# Patient Record
Sex: Female | Born: 1955 | Race: White | Hispanic: No | Marital: Single | Smoking: Never smoker
Health system: Southern US, Community
[De-identification: ages and names within clinical notes are randomized; demographics above are authoritative.]

## PROBLEM LIST (undated history)

## (undated) DIAGNOSIS — R32 Unspecified urinary incontinence: Secondary | ICD-10-CM

## (undated) DIAGNOSIS — I10 Essential (primary) hypertension: Secondary | ICD-10-CM

## (undated) HISTORY — DX: Essential (primary) hypertension: I10

## (undated) HISTORY — DX: Unspecified urinary incontinence: R32

## (undated) HISTORY — PX: STRABISMUS SURGERY: SHX218

---

## 2017-10-29 ENCOUNTER — Encounter: Payer: Self-pay | Admitting: Emergency Medicine

## 2017-10-29 ENCOUNTER — Emergency Department
Admission: EM | Admit: 2017-10-29 | Discharge: 2017-10-30 | Disposition: A | Discharge status: Home / Self Care | Payer: No Typology Code available for payment source | Attending: Emergency Medicine | Admitting: Emergency Medicine

## 2017-10-29 ENCOUNTER — Emergency Department: Payer: No Typology Code available for payment source

## 2017-10-29 ENCOUNTER — Other Ambulatory Visit: Payer: Self-pay

## 2017-10-29 DIAGNOSIS — R42 Dizziness and giddiness: Secondary | ICD-10-CM | POA: Diagnosis not present

## 2017-10-29 DIAGNOSIS — I10 Essential (primary) hypertension: Secondary | ICD-10-CM | POA: Diagnosis not present

## 2017-10-29 DIAGNOSIS — H5462 Unqualified visual loss, left eye, normal vision right eye: Secondary | ICD-10-CM | POA: Diagnosis present

## 2017-10-29 LAB — SEDIMENTATION RATE: SED RATE: 19 mm/h (ref 0–30)

## 2017-10-29 LAB — CBC WITH DIFFERENTIAL/PLATELET
Basophils Absolute: 0.1 10*3/uL (ref 0–0.1)
Basophils Relative: 1 %
EOS PCT: 3 %
Eosinophils Absolute: 0.2 10*3/uL (ref 0–0.7)
HCT: 38.2 % (ref 35.0–47.0)
Hemoglobin: 13.5 g/dL (ref 12.0–16.0)
LYMPHS ABS: 1.8 10*3/uL (ref 1.0–3.6)
LYMPHS PCT: 29 %
MCH: 31.5 pg (ref 26.0–34.0)
MCHC: 35.2 g/dL (ref 32.0–36.0)
MCV: 89.3 fL (ref 80.0–100.0)
MONO ABS: 0.4 10*3/uL (ref 0.2–0.9)
MONOS PCT: 7 %
Neutro Abs: 3.8 10*3/uL (ref 1.4–6.5)
Neutrophils Relative %: 60 %
PLATELETS: 299 10*3/uL (ref 150–440)
RBC: 4.28 MIL/uL (ref 3.80–5.20)
RDW: 13.6 % (ref 11.5–14.5)
WBC: 6.2 10*3/uL (ref 3.6–11.0)

## 2017-10-29 LAB — COMPREHENSIVE METABOLIC PANEL
ALT: 26 U/L (ref 0–44)
AST: 22 U/L (ref 15–41)
Albumin: 4.3 g/dL (ref 3.5–5.0)
Alkaline Phosphatase: 65 U/L (ref 38–126)
Anion gap: 8 (ref 5–15)
BUN: 20 mg/dL (ref 8–23)
CHLORIDE: 109 mmol/L (ref 98–111)
CO2: 24 mmol/L (ref 22–32)
Calcium: 9.3 mg/dL (ref 8.9–10.3)
Creatinine, Ser: 0.52 mg/dL (ref 0.44–1.00)
GFR calc Af Amer: >60 mL/min (ref >60)
Glucose, Bld: 94 mg/dL (ref 70–99)
POTASSIUM: 4 mmol/L (ref 3.5–5.1)
Sodium: 141 mmol/L (ref 135–145)
TOTAL PROTEIN: 7.5 g/dL (ref 6.5–8.1)
Total Bilirubin: 0.6 mg/dL (ref 0.3–1.2)

## 2017-10-29 LAB — TROPONIN I

## 2017-10-29 LAB — C-REACTIVE PROTEIN: CRP: <0.8 mg/dL (ref <1.0)

## 2017-10-29 MED ORDER — CLONIDINE HCL 0.1 MG PO TABS: 0.1000 mg | ORAL_TABLET | Freq: Two times a day (BID) | ORAL | 1 refills | Status: DC

## 2017-10-29 MED ORDER — AMLODIPINE BESYLATE 5 MG PO TABS: 5.0000 mg | ORAL_TABLET | Freq: Every day | ORAL | 1 refills | Status: DC

## 2017-10-29 MED ORDER — CLONIDINE HCL 0.1 MG PO TABS
0.1000 mg | ORAL_TABLET | Freq: Once | ORAL | Status: AC
  Administered 2017-10-29: 0.1 mg via ORAL
  Filled 2017-10-29: qty 1

## 2017-10-29 MED ORDER — AMLODIPINE BESYLATE 5 MG PO TABS
5.0000 mg | ORAL_TABLET | Freq: Once | ORAL | Status: AC
  Administered 2017-10-29: 5 mg via ORAL
  Filled 2017-10-29: qty 1

## 2017-10-29 MED ORDER — HYDRALAZINE HCL 20 MG/ML IJ SOLN
10.0000 mg | Freq: Once | INTRAMUSCULAR | Status: AC
  Administered 2017-10-29: 10 mg via INTRAVENOUS
  Filled 2017-10-29: qty 1

## 2017-10-29 MED ORDER — ACETAMINOPHEN 500 MG PO TABS
1000.0000 mg | ORAL_TABLET | Freq: Once | ORAL | Status: DC
  Filled 2017-10-29: qty 2

## 2017-10-29 NOTE — ED Notes (Signed)
Pt is going over to CT.

## 2017-10-29 NOTE — Discharge Instructions (Signed)
These make sure to take amlodipine once a day and clonidine twice a day as prescribed to prevent your blood pressure for becoming as high as it was in the emergency room which can lead to strokes and heart attacks.  Follow[facility] in 2 days for recheck of her blood pressure.  Return to the emergency room for dizziness, severe headache, chest pain, shortness of breath.

## 2017-10-29 NOTE — ED Provider Notes (Signed)
[facility] Emergency Department Provider Note  ____________________________________________  Time seen: Approximately 8:18 PM  I have reviewed the triage vital signs and the nursing notes.   HISTORY  Chief Complaint Loss of Vision   HPI Rachel Whitehead is a 62 y.o. female with no significant past medical history who presents to the emergency room for evaluation of elevated blood pressure.  Patient was sent here by her ophthalmologist.  Patient went to see her ophthalmologist today for visual changes in her left eye.  Patient has a history of strabismic amblyopia of the L eye. 5 days ago she noticed a gray spot in her vision. 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 Her BP was extremely elevated and she was also complaining of left-sided headache which prompted the ophthalmologist to send her to the emergency room for evaluation.  Patient reports being told in the past that she had high blood pressure but she was never put on medicine.  She has not seen a doctor several years.  Patient is complaining of a 2 out of 10 left-sided sharp headache that is located in the left temporal region which has been intermittent for the last few weeks.  She denies jaw claudication.  She denies weight loss, night sweats, fever chills.  Over the last 5 days she has been having episodes that she describes of feeling off balance.  These episodes are very short-lived.  She also has had intermittent nausea associated with these episodes.  No gait instability, no slurred speech, no dysarthria, no dysphasia, no unilateral weakness or numbness.  Patient reports family history of stroke in her mother.  No history of smoking.  PMH None  Prior to Admission medications   Medication Sig Start Date End Date Taking? Authorizing Provider  amLODipine (NORVASC) 5 MG tablet Take 1 tablet (5 mg total) by mouth daily. 10/29/17 10/29/18  Nita Sickle,  MD  cloNIDine (CATAPRES) 0.1 MG tablet Take 1 tablet (0.1 mg total) by mouth 2 (two) times daily. 10/29/17 10/29/18  Nita Sickle, MD    Allergies Patient has no known allergies.  FH Stroke - mother  Social History Social History   Tobacco Use  . Smoking status: Never Smoker  Substance Use Topics  . Alcohol use: Not on file  . Drug use: Not on file    Review of Systems  Constitutional: Negative for fever. Eyes: + L visual changes. ENT: Negative for sore throat. Neck: No neck pain  Cardiovascular: Negative for chest pain. Respiratory: Negative for shortness of breath. Gastrointestinal: Negative for abdominal pain, vomiting or diarrhea. Genitourinary: Negative for dysuria. Musculoskeletal: Negative for back pain. Skin: Negative for rash. Neurological: + L sided headaches and dizziness. No weakness or numbness. Psych: No SI or HI  ____________________________________________   PHYSICAL EXAM:  VITAL SIGNS: ED Triage Vitals  Enc Vitals Group     BP 10/29/17 1809 (!) 196/87     Pulse Rate 10/29/17 1809 (!) 57     Resp 10/29/17 1809 20     Temp 10/29/17 1809 98.6 F (37 C)     Temp Source 10/29/17 1809 Oral     SpO2 10/29/17 1809 99 %     Weight 10/29/17 1810 160 lb (72.6 kg)     Height 10/29/17 1810 5\' 4"  (1.626 m)     Head Circumference --      Peak Flow --      Pain Score 10/29/17 1810 0  Pain Loc --      Pain Edu? --      Excl. in GC? --     Constitutional: Alert and oriented. Well appearing and in no apparent distress. HEENT:      Head: Normocephalic and atraumatic.   No temporal region tenderness      Eyes: Conjunctivae are normal. Sclera is non-icteric.       Mouth/Throat: Mucous membranes are moist.       Neck: Supple with no signs of meningismus. Cardiovascular: Regular rate and rhythm. No murmurs, gallops, or rubs. 2+ symmetrical distal pulses are present in all extremities. No JVD. Respiratory: Normal respiratory effort. Lungs are clear to  auscultation bilaterally. No wheezes, crackles, or rhonchi.  Gastrointestinal: Soft, non tender, and non distended with positive bowel sounds. No rebound or guarding. Musculoskeletal: Nontender with normal range of motion in all extremities. No edema, cyanosis, or erythema of extremities. Neurologic: Normal speech and language. A & O x3, PERRL, EOMI, no nystagmus, CN II-XII intact, motor testing reveals good tone and bulk throughout. There is no evidence of pronator drift or dysmetria. Muscle strength is 5/5 throughout. Deep tendon reflexes are 2+ throughout with downgoing toes. Sensory examination is intact. Gait is normal. Skin: Skin is warm, dry and intact. No rash noted. Psychiatric: Mood and affect are normal. Speech and behavior are normal.  ____________________________________________   LABS (all labs ordered are listed, but only abnormal results are displayed)  Labs Reviewed  CBC WITH DIFFERENTIAL/PLATELET  COMPREHENSIVE METABOLIC PANEL  TROPONIN I  SEDIMENTATION RATE  C-REACTIVE PROTEIN   ____________________________________________  EKG  ED ECG REPORT I, Nita Sicklearolina Meloni Hinz, the attending physician, personally viewed and interpreted this ECG.  Sinus bradycardia first-degree AV block, rate of 56, normal QRS and QTC, left axis deviation, no ST elevations or depressions.  No prior for comparison. ____________________________________________  RADIOLOGY  I have personally reviewed the images performed during this visit and I agree with the Radiologist's read.   Interpretation by Radiologist:  Ct Head Wo Contrast  Result Date: 10/29/2017 CLINICAL DATA:  LEFT eye visual disturbance for 1.5 weeks, episodic peripheral vertigo EXAM: CT HEAD WITHOUT CONTRAST TECHNIQUE: Contiguous axial images were obtained from the base of the skull through the vertex without intravenous contrast. Sagittal and coronal MPR images reconstructed from axial data set. COMPARISON:  None FINDINGS: Brain:  Normal ventricular morphology. No midline shift or mass effect. Small vessel chronic ischemic changes of deep cerebral white matter. Streak artifacts at skull base including at RIGHT ICA. Volume averaging with RIGHT anterior clinoid. No definite intracranial hemorrhage, mass lesion, or evidence of acute infarction. No extra-axial fluid collections. Vascular: No hyperdense vessels Skull: Intact Sinuses/Orbits: Clear Other: N/A IMPRESSION: No acute intracranial abnormalities. Electronically Signed   By: Ulyses SouthwardMark  Boles M.D.   On: 10/29/2017 20:52   Mr Brain Wo Contrast  Result Date: 10/29/2017 CLINICAL DATA:  Left eye visual disturbance.  Hypertension. EXAM: MRI HEAD WITHOUT CONTRAST TECHNIQUE: Multiplanar, multiecho pulse sequences of the brain and surrounding structures were obtained without intravenous contrast. COMPARISON:  Head CT 10/29/2017 FINDINGS: BRAIN: There is no acute infarct, acute hemorrhage or mass effect. The midline structures are normal. There are no old infarcts. Early confluent hyperintense T2-weighted signal of the periventricular and deep white matter, most commonly due to chronic ischemic microangiopathy. Generalized atrophy without lobar predilection. Susceptibility-sensitive sequences show no chronic microhemorrhage or superficial siderosis. VASCULAR: Major intracranial arterial and venous sinus flow voids are preserved. SKULL AND UPPER CERVICAL SPINE: The visualized skull base,  calvarium, upper cervical spine and extracranial soft tissues are normal. SINUSES/ORBITS: No fluid levels or advanced mucosal thickening. No mastoid or middle ear effusion. The orbits are normal. 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 Electronically Signed   By: Deatra Robinson M.D.   On: 10/29/2017 22:59      ____________________________________________   PROCEDURES  Procedure(s) performed: None Procedures Critical Care performed:   None ____________________________________________   INITIAL IMPRESSION / ASSESSMENT AND PLAN / ED COURSE   62 y.o. female with no significant past medical history who presents to the emergency room for evaluation of elevated blood pressure, HA, and dizziness.  Patient is hypertensive with systolics between low 200s in upper 190s, she is otherwise neurologically intact.  Ophthalmologist was concerned for an organ damage on possible giant cell arteritis.  Patient has no tenderness on the temporal region, she has no systemic symptoms.  She has a very mild headache which could be due to the retinal artery occlusion found by him on exam.  Due to episodes that seem consistent with vertigo and extremely elevated blood pressure I will pursue a head CT to rule out stroke.  Will get labs to rule out endorgan damage.  EKG shows no evidence of ischemia.  If imaging is negative for stroke will start patient on antihypertensive medication.    _________________________ 11:42 PM on 10/29/2017 -----------------------------------------  Labs showing no endorgan damage.  CT and MRI of the brain with no evidence of stroke.  Patient was started on amlodipine but continued to have persistent elevated blood pressure.  She was given a dose of IV hydralazine.  That led to improvement of BP. Will add clonidine to PO amlodipine and if BP continues to improve will DC home. Care transferred to Dr. Manson Passey   As part of my medical decision making, I reviewed the following data within the electronic MEDICAL RECORD NUMBER Nursing notes reviewed and incorporated, Labs reviewed , EKG interpreted , Old EKG reviewed, Old chart reviewed, Radiograph reviewed , Notes from prior ED visits and [place] Controlled Substance Database    Pertinent labs & imaging results that were available during my care of the patient were reviewed by me and considered in my medical decision making (see chart for  details).    ____________________________________________   FINAL CLINICAL IMPRESSION(S) / ED DIAGNOSES  Final diagnoses:  Hypertension, unspecified type  Dizziness      NEW MEDICATIONS STARTED DURING THIS VISIT:  ED Discharge Orders        Ordered    cloNIDine (CATAPRES) 0.1 MG tablet  2 times daily     10/29/17 2348    amLODipine (NORVASC) 5 MG tablet  Daily     10/29/17 2348       Note:  This document was prepared using Dragon voice recognition software and may include unintentional dictation errors.    Don Perking, [place], MD 10/29/17 [phone]

## 2017-10-29 NOTE — ED Notes (Signed)
Pt stated that since last Wednesday she has been seeing a gray area at the bottom of her left eye along with some dizziness,nausea and headache. Pt stated that we went to her eye doctor today and had vision checked and he noticed a decrease in her vision and he was also concerned due to her BP being elevated.

## 2017-10-29 NOTE — ED Notes (Signed)
Pt is back from MRI. Visitors at bedside.

## 2017-10-29 NOTE — ED Triage Notes (Signed)
Decreased vision L eye x 5 days. Saw vision center and comes with note with MD concerns.

## 2017-10-29 NOTE — ED Notes (Signed)
Pt is back from CT

## 2017-10-30 NOTE — ED Notes (Signed)
Offerred pt tylenol for headache. Pt declined.

## 2017-10-30 NOTE — ED Notes (Signed)
Family at bedside. 

## 2017-10-30 NOTE — ED Notes (Signed)
Pt resting quietly.

## 2018-10-10 ENCOUNTER — Other Ambulatory Visit: Payer: Self-pay | Admitting: *Deleted

## 2018-10-10 DIAGNOSIS — Z20822 Contact with and (suspected) exposure to covid-19: Secondary | ICD-10-CM

## 2018-10-14 LAB — NOVEL CORONAVIRUS, NAA: SARS-CoV-2, NAA: NOT DETECTED

## 2019-10-28 ENCOUNTER — Other Ambulatory Visit: Payer: Self-pay | Admitting: Physician Assistant

## 2019-10-28 DIAGNOSIS — M7502 Adhesive capsulitis of left shoulder: Secondary | ICD-10-CM

## 2019-11-12 ENCOUNTER — Ambulatory Visit
Admission: RE | Admit: 2019-11-12 | Discharge: 2019-11-12 | Disposition: A | Discharge status: Home / Self Care | Payer: Medicare HMO | Source: Ambulatory Visit | Attending: Physician Assistant | Admitting: Physician Assistant

## 2019-11-12 ENCOUNTER — Other Ambulatory Visit: Payer: Self-pay

## 2019-11-12 DIAGNOSIS — M7502 Adhesive capsulitis of left shoulder: Secondary | ICD-10-CM | POA: Diagnosis present

## 2020-04-28 DIAGNOSIS — M25612 Stiffness of left shoulder, not elsewhere classified: Secondary | ICD-10-CM | POA: Diagnosis not present

## 2020-04-28 DIAGNOSIS — G8929 Other chronic pain: Secondary | ICD-10-CM | POA: Diagnosis not present

## 2020-04-28 DIAGNOSIS — M6281 Muscle weakness (generalized): Secondary | ICD-10-CM | POA: Diagnosis not present

## 2020-04-28 DIAGNOSIS — M25511 Pain in right shoulder: Secondary | ICD-10-CM | POA: Diagnosis not present

## 2020-05-05 DIAGNOSIS — M25511 Pain in right shoulder: Secondary | ICD-10-CM | POA: Diagnosis not present

## 2020-05-05 DIAGNOSIS — M25612 Stiffness of left shoulder, not elsewhere classified: Secondary | ICD-10-CM | POA: Diagnosis not present

## 2020-05-05 DIAGNOSIS — M6281 Muscle weakness (generalized): Secondary | ICD-10-CM | POA: Diagnosis not present

## 2020-05-05 DIAGNOSIS — G8929 Other chronic pain: Secondary | ICD-10-CM | POA: Diagnosis not present

## 2020-05-12 DIAGNOSIS — G8929 Other chronic pain: Secondary | ICD-10-CM | POA: Diagnosis not present

## 2020-05-12 DIAGNOSIS — M6281 Muscle weakness (generalized): Secondary | ICD-10-CM | POA: Diagnosis not present

## 2020-05-12 DIAGNOSIS — M25511 Pain in right shoulder: Secondary | ICD-10-CM | POA: Diagnosis not present

## 2020-05-12 DIAGNOSIS — M25612 Stiffness of left shoulder, not elsewhere classified: Secondary | ICD-10-CM | POA: Diagnosis not present

## 2020-05-13 DIAGNOSIS — H2513 Age-related nuclear cataract, bilateral: Secondary | ICD-10-CM | POA: Diagnosis not present

## 2020-05-13 DIAGNOSIS — H53002 Unspecified amblyopia, left eye: Secondary | ICD-10-CM | POA: Diagnosis not present

## 2020-05-13 DIAGNOSIS — H47012 Ischemic optic neuropathy, left eye: Secondary | ICD-10-CM | POA: Diagnosis not present

## 2020-05-13 DIAGNOSIS — H34232 Retinal artery branch occlusion, left eye: Secondary | ICD-10-CM | POA: Diagnosis not present

## 2020-05-19 DIAGNOSIS — M6281 Muscle weakness (generalized): Secondary | ICD-10-CM | POA: Diagnosis not present

## 2020-05-19 DIAGNOSIS — M25511 Pain in right shoulder: Secondary | ICD-10-CM | POA: Diagnosis not present

## 2020-05-19 DIAGNOSIS — G8929 Other chronic pain: Secondary | ICD-10-CM | POA: Diagnosis not present

## 2020-05-19 DIAGNOSIS — M25612 Stiffness of left shoulder, not elsewhere classified: Secondary | ICD-10-CM | POA: Diagnosis not present

## 2020-05-24 DIAGNOSIS — M7582 Other shoulder lesions, left shoulder: Secondary | ICD-10-CM | POA: Diagnosis not present

## 2020-05-24 DIAGNOSIS — M7502 Adhesive capsulitis of left shoulder: Secondary | ICD-10-CM | POA: Diagnosis not present

## 2020-05-31 ENCOUNTER — Other Ambulatory Visit: Payer: Self-pay | Admitting: Surgery

## 2020-05-31 DIAGNOSIS — M7502 Adhesive capsulitis of left shoulder: Secondary | ICD-10-CM

## 2020-05-31 DIAGNOSIS — M7582 Other shoulder lesions, left shoulder: Secondary | ICD-10-CM

## 2020-06-02 DIAGNOSIS — M6281 Muscle weakness (generalized): Secondary | ICD-10-CM | POA: Diagnosis not present

## 2020-06-02 DIAGNOSIS — M25612 Stiffness of left shoulder, not elsewhere classified: Secondary | ICD-10-CM | POA: Diagnosis not present

## 2020-06-02 DIAGNOSIS — M25511 Pain in right shoulder: Secondary | ICD-10-CM | POA: Diagnosis not present

## 2020-06-02 DIAGNOSIS — G8929 Other chronic pain: Secondary | ICD-10-CM | POA: Diagnosis not present

## 2020-06-09 DIAGNOSIS — M25511 Pain in right shoulder: Secondary | ICD-10-CM | POA: Diagnosis not present

## 2020-06-09 DIAGNOSIS — G8929 Other chronic pain: Secondary | ICD-10-CM | POA: Diagnosis not present

## 2020-06-09 DIAGNOSIS — M6281 Muscle weakness (generalized): Secondary | ICD-10-CM | POA: Diagnosis not present

## 2020-06-09 DIAGNOSIS — M25612 Stiffness of left shoulder, not elsewhere classified: Secondary | ICD-10-CM | POA: Diagnosis not present

## 2020-06-15 ENCOUNTER — Other Ambulatory Visit: Payer: Self-pay

## 2020-06-15 ENCOUNTER — Ambulatory Visit
Admission: RE | Admit: 2020-06-15 | Discharge: 2020-06-15 | Disposition: A | Discharge status: Home / Self Care | Payer: Medicare HMO | Source: Ambulatory Visit | Attending: Surgery | Admitting: Surgery

## 2020-06-15 DIAGNOSIS — M7582 Other shoulder lesions, left shoulder: Secondary | ICD-10-CM | POA: Insufficient documentation

## 2020-06-15 DIAGNOSIS — M7502 Adhesive capsulitis of left shoulder: Secondary | ICD-10-CM | POA: Insufficient documentation

## 2020-06-15 DIAGNOSIS — M19012 Primary osteoarthritis, left shoulder: Secondary | ICD-10-CM | POA: Diagnosis not present

## 2020-06-18 DIAGNOSIS — M7502 Adhesive capsulitis of left shoulder: Secondary | ICD-10-CM | POA: Diagnosis not present

## 2020-06-18 DIAGNOSIS — M7582 Other shoulder lesions, left shoulder: Secondary | ICD-10-CM | POA: Diagnosis not present

## 2020-09-01 DIAGNOSIS — Z0100 Encounter for examination of eyes and vision without abnormal findings: Secondary | ICD-10-CM | POA: Diagnosis not present

## 2021-03-25 ENCOUNTER — Encounter: Payer: Self-pay | Admitting: Family Medicine

## 2021-03-25 ENCOUNTER — Ambulatory Visit (INDEPENDENT_AMBULATORY_CARE_PROVIDER_SITE_OTHER): Payer: Medicare HMO | Admitting: Family Medicine

## 2021-03-25 ENCOUNTER — Other Ambulatory Visit: Payer: Self-pay

## 2021-03-25 VITALS — BP 132/80 | HR 77 | Ht 64.0 in | Wt 183.6 lb

## 2021-03-25 DIAGNOSIS — Z1231 Encounter for screening mammogram for malignant neoplasm of breast: Secondary | ICD-10-CM

## 2021-03-25 DIAGNOSIS — Z7689 Persons encountering health services in other specified circumstances: Secondary | ICD-10-CM | POA: Diagnosis not present

## 2021-03-25 DIAGNOSIS — Z0000 Encounter for general adult medical examination without abnormal findings: Secondary | ICD-10-CM | POA: Diagnosis not present

## 2021-03-25 DIAGNOSIS — I10 Essential (primary) hypertension: Secondary | ICD-10-CM | POA: Diagnosis not present

## 2021-03-25 DIAGNOSIS — Z1211 Encounter for screening for malignant neoplasm of colon: Secondary | ICD-10-CM | POA: Diagnosis not present

## 2021-03-25 MED ORDER — AMLODIPINE BESYLATE 2.5 MG PO TABS: 2.5000 mg | ORAL_TABLET | Freq: Every day | ORAL | 3 refills | Status: DC

## 2021-03-25 NOTE — Progress Notes (Signed)
Subjective:    Patient ID: Rachel Whitehead, female    DOB: February 20, 1956, 65 y.o.   MRN: [id number]  Norely Spoerl is a 65 y.o. female presenting on 03/25/2021 for Establish Care   HPI  Here for Annual Physical and Lab Review.  CHRONIC HTN: Reports chronic history of elevated BP. Home BP 130-140 range. Current Meds - Amlodipine 5mg  daily   Reports good compliance, took meds today. Tolerating well, w/o complaints. Lifestyle: - Diet: balanced, she does a cleanse - Exercise: walking, gardening, active. Denies CP, dyspnea, HA, edema, dizziness / lightheadedness  Health Maintenance:  Breast CA Screening: Due for mammogram screening. Last mammogram result >2+ years ago reported normal. No prior history abnormal mammogram. No known family history of breast cancer. Currently asymptomatic.  Colon CA Screening: Never had colonoscopy. Currently asymptomatic. No known family history of colon CA. Due for screening test new referral to GI for colonoscopy     Depression screen [facility] 2/9 03/25/2021  Decreased Interest 1  Down, Depressed, Hopeless 1  PHQ - 2 Score 2  Altered sleeping 1  Tired, decreased energy 0  Change in appetite 0  Feeling bad or failure about yourself  0  Trouble concentrating 1  Moving slowly or fidgety/restless 0  Suicidal thoughts 0  PHQ-9 Score 4  Difficult doing work/chores Somewhat difficult    Past Medical History:  Diagnosis Date   Hypertension    Urinary incontinence    Past Surgical History:  Procedure Laterality Date   STRABISMUS SURGERY Left    1961   Social History   Socioeconomic History   Marital status: Single    Spouse name: Not on file   Number of children: Not on file   Years of education: Not on file   Highest education level: Not on file  Occupational History   Not on file  Tobacco Use   Smoking status: Never   Smokeless tobacco: Not on file  Substance and Sexual Activity   Alcohol use: Yes    Alcohol/week: 4.0 standard drinks     Types: 4 Standard drinks or equivalent per week   Drug use: Not on file   Sexual activity: Not on file  Other Topics Concern   Not on file  Social History Narrative   Not on file   Social Determinants of Health   Financial Resource Strain: Not on file  Food Insecurity: Not on file  Transportation Needs: Not on file  Physical Activity: Not on file  Stress: Not on file  Social Connections: Not on file  Intimate Partner Violence: Not on file   Family History  Problem Relation Age of Onset   Cancer Father    Breast cancer Sister 1   No current outpatient medications on file prior to visit.   No current facility-administered medications on file prior to visit.    Review of Systems  Constitutional:  Negative for activity change, appetite change, chills, diaphoresis, fatigue and fever.  HENT:  Negative for congestion and hearing loss.   Eyes:  Negative for visual disturbance.  Respiratory:  Negative for cough, chest tightness, shortness of breath and wheezing.   Cardiovascular:  Negative for chest pain, palpitations and leg swelling.  Gastrointestinal:  Negative for abdominal pain, constipation, diarrhea, nausea and vomiting.  Genitourinary:  Negative for dysuria, frequency and hematuria.  Musculoskeletal:  Negative for arthralgias and neck pain.  Skin:  Negative for rash.  Neurological:  Negative for dizziness, weakness, light-headedness, numbness and headaches.  Hematological:  Negative for  adenopathy.  Psychiatric/Behavioral:  Negative for behavioral problems, dysphoric mood and sleep disturbance.   Per HPI unless specifically indicated above     Objective:    BP 132/80 (BP Location: Left Arm, Cuff Size: Normal)    Pulse 77    Ht 5\' 4"  (1.626 m)    Wt 183 lb 9.6 oz (83.3 kg)    SpO2 99%    BMI 31.51 kg/m   Wt Readings from Last 3 Encounters:  03/25/21 183 lb 9.6 oz (83.3 kg)  10/29/17 160 lb (72.6 kg)    Physical Exam Vitals and nursing note reviewed.   Constitutional:      General: She is not in acute distress.    Appearance: She is well-developed. She is not diaphoretic.     Comments: Well-appearing, comfortable, cooperative  HENT:     Head: Normocephalic and atraumatic.  Eyes:     General:        Right eye: No discharge.        Left eye: No discharge.     Conjunctiva/sclera: Conjunctivae normal.     Pupils: Pupils are equal, round, and reactive to light.  Neck:     Thyroid: No thyromegaly.     Vascular: No carotid bruit.  Cardiovascular:     Rate and Rhythm: Normal rate and regular rhythm.     Pulses: Normal pulses.     Heart sounds: Normal heart sounds. No murmur heard. Pulmonary:     Effort: Pulmonary effort is normal. No respiratory distress.     Breath sounds: Normal breath sounds. No wheezing or rales.  Abdominal:     General: Bowel sounds are normal. There is no distension.     Palpations: Abdomen is soft. There is no mass.     Tenderness: There is no abdominal tenderness.  Musculoskeletal:        General: No tenderness. Normal range of motion.     Cervical back: Normal range of motion and neck supple.     Right lower leg: No edema.     Left lower leg: No edema.     Comments: Upper / Lower Extremities: - Normal muscle tone, strength bilateral upper extremities 5/5, lower extremities 5/5  Lymphadenopathy:     Cervical: No cervical adenopathy.  Skin:    General: Skin is warm and dry.     Findings: No erythema or rash.  Neurological:     Mental Status: She is alert and oriented to person, place, and time.     Comments: Distal sensation intact to light touch all extremities  Psychiatric:        Mood and Affect: Mood normal.        Behavior: Behavior normal.        Thought Content: Thought content normal.     Comments: Well groomed, good eye contact, normal speech and thoughts      Results for orders placed or performed in visit on 10/10/18  Novel Coronavirus, NAA ([facility])  Result Value Ref Range    SARS-CoV-2, NAA Not Detected Not Detected      Assessment & Plan:   Problem List Items Addressed This Visit     Essential hypertension   Relevant Medications   amLODipine (NORVASC) 2.5 MG tablet   Other Relevant Orders   COMPLETE METABOLIC PANEL WITH GFR   Lipid panel   CBC with Differential/Platelet   Other Visit Diagnoses     Annual physical exam    -  Primary   Relevant Orders  COMPLETE METABOLIC PANEL WITH GFR   Lipid panel   CBC with Differential/Platelet   Encounter to establish care with new doctor       Encounter for screening mammogram for malignant neoplasm of breast       Relevant Orders   MM DIGITAL SCREENING BILATERAL   Screening for colon cancer       Relevant Orders   Ambulatory referral to Gastroenterology       Updated Health Maintenance information Fasting labs today ordered, pending results Encouraged improvement to lifestyle with diet and exercise Goal of weight loss  HTN is controlled, will reduce dose to Amlodipine 2.5mg  daily new rx, she will monitor BP and keep improving lifestyle.  Mammogram ordered, screening, do not have copy of last mammo.  Initial colonoscopy ordered for screening, AGI referral.   Orders Placed This Encounter  Procedures   MM DIGITAL SCREENING BILATERAL    Standing Status:   Future    Standing Expiration Date:   03/25/2022    Order Specific Question:   Reason for Exam (SYMPTOM  OR DIAGNOSIS REQUIRED)    Answer:   Routine screening bilateral mammogram. May change to Bilateral MM Tomo screening if indicated and appropriate for patient/insurance    Order Specific Question:   Preferred imaging location?    Answer:   [facility]   COMPLETE METABOLIC PANEL WITH GFR   Lipid panel    Order Specific Question:   Has the patient fasted?    Answer:   Yes   CBC with Differential/Platelet   Ambulatory referral to Gastroenterology    Referral Priority:   Routine    Referral Type:   Consultation    Referral Reason:    Specialty Services Required    Number of Visits Requested:   1       Meds ordered this encounter  Medications   amLODipine (NORVASC) 2.5 MG tablet    Sig: Take 1 tablet (2.5 mg total) by mouth daily.    Dispense:  90 tablet    Refill:  3      Follow up plan: Return in about 6 months (around 09/23/2021) for 6 month follow-up HTN.  Saralyn Pilar, DO [facility] 03/25/2021, 10:11 AM

## 2021-03-25 NOTE — Patient Instructions (Addendum)
Thank you for coming to the office today.  Reduced dose of Amlodipine to 2.5mg  take once daily.   For Mammogram screening for breast cancer   Call the Imaging Center below anytime to schedule your own appointment now that order has been placed.  [facility] [address] [place], [place] [address] Phone: [phone]  --------------------------------  For Colonoscopy - they will call YOU and they can schedule.  [facility]) [address] - [address] [place], [place] [address] Phone: [phone]  ------  Refilled your blood pressure medication Amlodipine 5 mg daily.  Labs today! Stay tuned for results on mychart.   Please schedule a Follow-up Appointment to: Return in about 6 months (around 09/23/2021) for 6 month follow-up HTN.  If you have any other questions or concerns, please feel free to call the office or send a message through MyChart. You may also schedule an earlier appointment if necessary.  Additionally, you may be receiving a survey about your experience at our office within a few days to 1 week by e-mail or mail. We value your feedback.  Saralyn Pilar, DO [facility], [place]

## 2021-03-26 LAB — CBC WITH DIFFERENTIAL/PLATELET
Absolute Monocytes: 408 cells/uL (ref 200–950)
Basophils Absolute: 41 cells/uL (ref 0–200)
Basophils Relative: 0.8 %
Eosinophils Absolute: 240 cells/uL (ref 15–500)
Eosinophils Relative: 4.7 %
HCT: 39.1 % (ref 35.0–45.0)
Hemoglobin: 13.2 g/dL (ref 11.7–15.5)
Lymphs Abs: 1826 cells/uL (ref 850–3900)
MCH: 30.8 pg (ref 27.0–33.0)
MCHC: 33.8 g/dL (ref 32.0–36.0)
MCV: 91.1 fL (ref 80.0–100.0)
MPV: 11.9 fL (ref 7.5–12.5)
Monocytes Relative: 8 %
Neutro Abs: 2586 cells/uL (ref 1500–7800)
Neutrophils Relative %: 50.7 %
Platelets: 256 10*3/uL (ref 140–400)
RBC: 4.29 10*6/uL (ref 3.80–5.10)
RDW: 12.9 % (ref 11.0–15.0)
Total Lymphocyte: 35.8 %
WBC: 5.1 10*3/uL (ref 3.8–10.8)

## 2021-03-26 LAB — COMPLETE METABOLIC PANEL WITH GFR
AG Ratio: 1.9 (calc) (ref 1.0–2.5)
ALT: 32 U/L — ABNORMAL HIGH (ref 6–29)
AST: 33 U/L (ref 10–35)
Albumin: 4.3 g/dL (ref 3.6–5.1)
Alkaline phosphatase (APISO): 71 U/L (ref 37–153)
BUN: 12 mg/dL (ref 7–25)
CO2: 27 mmol/L (ref 20–32)
Calcium: 9.4 mg/dL (ref 8.6–10.4)
Chloride: 108 mmol/L (ref 98–110)
Creat: 0.64 mg/dL (ref 0.50–1.05)
Globulin: 2.3 g/dL (calc) (ref 1.9–3.7)
Glucose, Bld: 90 mg/dL (ref 65–139)
Potassium: 4.6 mmol/L (ref 3.5–5.3)
Sodium: 141 mmol/L (ref 135–146)
Total Bilirubin: 0.5 mg/dL (ref 0.2–1.2)
Total Protein: 6.6 g/dL (ref 6.1–8.1)
eGFR: 97 mL/min/{1.73_m2} (ref >=60)

## 2021-03-26 LAB — LIPID PANEL
Cholesterol: 191 mg/dL (ref <200)
HDL: 57 mg/dL (ref >=50)
LDL Cholesterol (Calc): 117 mg/dL (calc) — ABNORMAL HIGH
Non-HDL Cholesterol (Calc): 134 mg/dL (calc) — ABNORMAL HIGH (ref <130)
Total CHOL/HDL Ratio: 3.4 (calc) (ref <5.0)
Triglycerides: 78 mg/dL (ref <150)

## 2021-03-30 ENCOUNTER — Telehealth: Payer: Self-pay

## 2021-03-30 NOTE — Telephone Encounter (Signed)
CALLED PATIENT NO ANSWER LEFT VOICEMAIL FOR A CALL BACK ? ?

## 2021-04-06 ENCOUNTER — Telehealth: Payer: Self-pay

## 2021-04-06 NOTE — Telephone Encounter (Signed)
CALLED PATIENT NO ANSWER LEFT VOICEMAIL FOR A CALL BACK ? ?

## 2021-04-19 ENCOUNTER — Other Ambulatory Visit: Payer: Self-pay | Admitting: Family Medicine

## 2021-04-19 DIAGNOSIS — I10 Essential (primary) hypertension: Secondary | ICD-10-CM

## 2021-04-19 MED ORDER — AMLODIPINE BESYLATE 2.5 MG PO TABS: 2.5000 mg | ORAL_TABLET | Freq: Every day | ORAL | 3 refills | Status: DC

## 2021-04-19 NOTE — Telephone Encounter (Signed)
[facility] computer was down  and they did not receive patient amLODipine (NORVASC) 2.5 MG tablet she would like nurse to resend Rx and would like a follow up call when completed.   [id number] [place], [place] - [address] Phone:  [phone]  Fax:  [phone]

## 2021-04-19 NOTE — Telephone Encounter (Signed)
Refilling as provider refilled 3 weeks ago, resending due to pharmacy system was down and did not receive.  Requested Prescriptions  Pending Prescriptions Disp Refills   amLODipine (NORVASC) 2.5 MG tablet 90 tablet 3    Sig: Take 1 tablet (2.5 mg total) by mouth daily.     Cardiovascular:  Calcium Channel Blockers Passed - 04/19/2021 11:22 AM      Passed - Last BP in normal range    BP Readings from Last 1 Encounters:  03/25/21 132/80         Passed - Valid encounter within last 6 months    Recent Outpatient Visits          3 weeks ago Annual physical exam   [place], DO      Future Appointments            In 5 months [place], Devonne Doughty, [facility], [facility]

## 2021-07-12 ENCOUNTER — Telehealth: Payer: Self-pay | Admitting: Family Medicine

## 2021-07-12 NOTE — Telephone Encounter (Signed)
Left message for patient to call back and schedule Medicare Annual Wellness Visit (AWV) to be done virtually or by telephone. ? ?No hx of AWV eligible as of 05/05/19 ? ?Please schedule at anytime with [facility].     ? ? ? ?Any questions, please call me at [phone]  ?

## 2021-08-15 DIAGNOSIS — H47012 Ischemic optic neuropathy, left eye: Secondary | ICD-10-CM | POA: Diagnosis not present

## 2021-08-15 DIAGNOSIS — H34232 Retinal artery branch occlusion, left eye: Secondary | ICD-10-CM | POA: Diagnosis not present

## 2021-09-23 ENCOUNTER — Ambulatory Visit (INDEPENDENT_AMBULATORY_CARE_PROVIDER_SITE_OTHER): Payer: Medicare HMO | Admitting: Family Medicine

## 2021-09-23 ENCOUNTER — Encounter: Payer: Self-pay | Admitting: Family Medicine

## 2021-09-23 ENCOUNTER — Other Ambulatory Visit: Payer: Self-pay | Admitting: Family Medicine

## 2021-09-23 DIAGNOSIS — I10 Essential (primary) hypertension: Secondary | ICD-10-CM

## 2021-09-23 DIAGNOSIS — E559 Vitamin D deficiency, unspecified: Secondary | ICD-10-CM

## 2021-09-23 DIAGNOSIS — E538 Deficiency of other specified B group vitamins: Secondary | ICD-10-CM

## 2021-09-23 DIAGNOSIS — Z0000 Encounter for general adult medical examination without abnormal findings: Secondary | ICD-10-CM

## 2021-09-23 MED ORDER — AMLODIPINE BESYLATE 2.5 MG PO TABS: 2.5000 mg | ORAL_TABLET | Freq: Every day | ORAL | 3 refills | Status: DC

## 2021-10-31 DIAGNOSIS — I4891 Unspecified atrial fibrillation: Secondary | ICD-10-CM | POA: Diagnosis not present

## 2021-11-30 ENCOUNTER — Encounter: Payer: Self-pay | Admitting: Family Medicine

## 2021-11-30 ENCOUNTER — Ambulatory Visit (INDEPENDENT_AMBULATORY_CARE_PROVIDER_SITE_OTHER): Payer: Medicare HMO | Admitting: Family Medicine

## 2021-11-30 VITALS — BP 124/73 | HR 66 | Ht 64.0 in | Wt 173.8 lb

## 2021-11-30 DIAGNOSIS — I4819 Other persistent atrial fibrillation: Secondary | ICD-10-CM | POA: Insufficient documentation

## 2021-11-30 NOTE — Progress Notes (Signed)
Subjective:    Patient ID: Rachel Whitehead, female    DOB: Feb 07, 1956, 66 y.o.   MRN: [id number]  Rachel Whitehead is a 66 y.o. female presenting on 11/30/2021 for Hospitalization Follow-up   HPI  ED / HOSPITAL FOLLOW-UP VISIT  Hospital/Location: [facility]  Reason for Admission: Atrial Fibrillation / Pneumonia  - Hospital H&P and Discharge Summary have been reviewed - Patient presents today 30 days after recent hospitalization. Brief summary of recent course, patient had symptoms of Atrial Fibrillation / Pneumonia, hospitalized, treated with antibiotics and anticoagulation.  She has Xarelto 46m daily 60 days remaining, of total 90 day amount.  - Today reports overall has done well after discharge. Symptoms of Atrial Fibrillation have been minimal. No heart racing or other concerns. No chest pain or dyspnea. Her pneumonia has resolved. She was told still has irregular heart beat and needs to see cardiologist. She wishes to have treatment to resolve AFib and come off anticoagulation  - New medications on discharge: Xarelto 280mdaily  I have reviewed the discharge medication list, and have reconciled the current and discharge medications today.   Current Outpatient Medications:    amLODipine (NORVASC) 2.5 MG tablet, Take 1 tablet (2.5 mg total) by mouth daily., Disp: 90 tablet, Rfl: 3   rivaroxaban (XARELTO) 20 MG TABS tablet, Take 20 mg by mouth daily with supper., Disp: , Rfl:   ------------------------------------------------------------------------- Social History   Tobacco Use   Smoking status: Never  Substance Use Topics   Alcohol use: Yes    Alcohol/week: 4.0 standard drinks of alcohol    Types: 4 Standard drinks or equivalent per week    Review of Systems Per HPI unless specifically indicated above     Objective:    BP 124/73   Pulse 66   Ht _0  (1.626 m)   Wt 173 lb 12.8 oz (78.8 kg)   SpO2 99%   BMI 29.83 kg/m   Wt Readings  from Last 3 Encounters:  11/30/21 173 lb 12.8 oz (78.8 kg)  09/23/21 176 lb 9.6 oz (80.1 kg)  03/25/21 183 lb 9.6 oz (83.3 kg)    Physical Exam Vitals and nursing note reviewed.  Constitutional:      General: She is not in acute distress.    Appearance: She is well-developed. She is not diaphoretic.     Comments: Well-appearing, comfortable, cooperative  HENT:     Head: Normocephalic and atraumatic.  Eyes:     General:        Right eye: No discharge.        Left eye: No discharge.     Conjunctiva/sclera: Conjunctivae normal.  Neck:     Thyroid: No thyromegaly.  Cardiovascular:     Rate and Rhythm: Normal rate. Rhythm irregular.     Heart sounds: Normal heart sounds. No murmur heard. Pulmonary:     Effort: Pulmonary effort is normal. No respiratory distress.     Breath sounds: Normal breath sounds. No wheezing or rales.  Musculoskeletal:        General: Normal range of motion.     Cervical back: Normal range of motion and neck supple.     Right lower leg: No edema.     Left lower leg: No edema.  Lymphadenopathy:     Cervical: No cervical adenopathy.  Skin:    General: Skin is warm and dry.     Findings: No erythema or rash.  Neurological:     Mental Status:  She is alert and oriented to person, place, and time.  Psychiatric:        Behavior: Behavior normal.     Comments: Well groomed, good eye contact, normal speech and thoughts    EKG - reviewed from hospitalization 10/31/21  Date: 10/31/21  Rate: 82  Rhythm: atrial fibrillation  QRS Axis: normal  Intervals: normal  ST/T Wave abnormalities: nonspecific ST changes  Conduction Disutrbances:left anterior fascicular block  Additional Narrative Interpretation: None  Old EKG Reviewed: 2019, reviewed. Sinus Bradycardia, LAFB     Results for orders placed or performed in visit on 03/25/21  COMPLETE METABOLIC PANEL WITH GFR  Result Value Ref Range   Glucose, Bld 90 65 - 139 mg/dL   BUN 12 7 - 25 mg/dL   Creat 0.64  0.50 - 1.05 mg/dL   eGFR 97 > OR = 60 mL/min/1.47m   BUN/Creatinine Ratio NOT APPLICABLE 6 - 22 (calc)   Sodium 141 135 - 146 mmol/L   Potassium 4.6 3.5 - 5.3 mmol/L   Chloride 108 98 - 110 mmol/L   CO2 27 20 - 32 mmol/L   Calcium 9.4 8.6 - 10.4 mg/dL   Total Protein 6.6 6.1 - 8.1 g/dL   Albumin 4.3 3.6 - 5.1 g/dL   Globulin 2.3 1.9 - 3.7 g/dL (calc)   AG Ratio 1.9 1.0 - 2.5 (calc)   Total Bilirubin 0.5 0.2 - 1.2 mg/dL   Alkaline phosphatase (APISO) 71 37 - 153 U/L   AST 33 10 - 35 U/L   ALT 32 (H) 6 - 29 U/L  Lipid panel  Result Value Ref Range   Cholesterol 191 <200 mg/dL   HDL 57 > OR = 50 mg/dL   Triglycerides 78 <150 mg/dL   LDL Cholesterol (Calc) 117 (H) mg/dL (calc)   Total CHOL/HDL Ratio 3.4 <5.0 (calc)   Non-HDL Cholesterol (Calc) 134 (H) <130 mg/dL (calc)  CBC with Differential/Platelet  Result Value Ref Range   WBC 5.1 3.8 - 10.8 Thousand/uL   RBC 4.29 3.80 - 5.10 Million/uL   Hemoglobin 13.2 11.7 - 15.5 g/dL   HCT 39.1 35.0 - 45.0 %   MCV 91.1 80.0 - 100.0 fL   MCH 30.8 27.0 - 33.0 pg   MCHC 33.8 32.0 - 36.0 g/dL   RDW 12.9 11.0 - 15.0 %   Platelets 256 140 - 400 Thousand/uL   MPV 11.9 7.5 - 12.5 fL   Neutro Abs 2,586 1,500 - 7,800 cells/uL   Lymphs Abs 1,826 850 - 3,900 cells/uL   Absolute Monocytes 408 200 - 950 cells/uL   Eosinophils Absolute 240 15 - 500 cells/uL   Basophils Absolute 41 0 - 200 cells/uL   Neutrophils Relative % 50.7 %   Total Lymphocyte 35.8 %   Monocytes Relative 8.0 %   Eosinophils Relative 4.7 %   Basophils Relative 0.8 %      Assessment & Plan:   Problem List Items Addressed This Visit     Persistent atrial fibrillation (HCC) - Primary   Relevant Medications   rivaroxaban (XARELTO) 20 MG TABS tablet   Other Relevant Orders   Ambulatory referral to Cardiology   ATrial Fibrillation, new onset but unknown precise onset time Secondary pneumonia HFU Resolved Pneumonia  Still in AFib at this time. No evidence of RVR or  symptoms or complication  Continue anticoagulation Xarelto 259mdaily, has 60 pills remaining  [place] of [place]  Will refer to [facility] Cardiology by her request and re-eval AFib at that time and determine options for cardioversion if medical or electrical and determine duration of anticoagulation.  Records from outside hospital to be scanned.  Orders Placed This Encounter  Procedures   Ambulatory referral to Cardiology    Referral Priority:   Routine    Referral Type:   Consultation    Referral Reason:   Specialty Services Required    Requested Specialty:   Cardiology    Number of Visits Requested:   1      No orders of the defined types were placed in this encounter.   Follow up plan: Return in about 3 months (around 03/02/2022) for 3 month fasting lab only then 1 week later Annual Physical (Early December 2023).   Nobie Putnam, [facility] 11/30/2021, 1:56 PM

## 2021-11-30 NOTE — Patient Instructions (Addendum)
Thank you for coming to the office today.  Keep on Xarelto 20mg  daily until you see the heart doctor for the Atrial Fibrillation. They will assist in converting you back to normal Rhythm. Can be done with medicines or electric shock, and they will advise you further on the blood thinner.  [facility] Cardiology ([facility]) [facility] [address] [place], [place]  [place] Phone: [phone]  DUE for FASTING BLOOD WORK (no food or drink after midnight before the lab appointment, only water or coffee without cream/sugar on the morning of)  SCHEDULE "Lab Only" visit in the morning at the clinic for lab draw in 3 MONTHS   - Make sure Lab Only appointment is at about 1 week before your next appointment, so that results will be available  For Lab Results, once available within 2-3 days of blood draw, you can can log in to MyChart online to view your results and a brief explanation. Also, we can discuss results at next follow-up visit.   Please schedule a Follow-up Appointment to: Return in about 3 months (around 03/02/2022) for 3 month fasting lab only then 1 week later Annual Physical (Early December 2023).  If you have any other questions or concerns, please feel free to call the office or send a message through MyChart. You may also schedule an earlier appointment if necessary.  Additionally, you may be receiving a survey about your experience at our office within a few days to 1 week by e-mail or mail. We value your feedback.  January 2024, DO [facility], [facility]

## 2021-12-08 DIAGNOSIS — I160 Hypertensive urgency: Secondary | ICD-10-CM | POA: Diagnosis not present

## 2021-12-08 DIAGNOSIS — Z23 Encounter for immunization: Secondary | ICD-10-CM | POA: Diagnosis not present

## 2021-12-08 DIAGNOSIS — H34232 Retinal artery branch occlusion, left eye: Secondary | ICD-10-CM | POA: Diagnosis not present

## 2021-12-08 DIAGNOSIS — I4819 Other persistent atrial fibrillation: Secondary | ICD-10-CM | POA: Diagnosis not present

## 2021-12-08 DIAGNOSIS — I480 Paroxysmal atrial fibrillation: Secondary | ICD-10-CM | POA: Diagnosis not present

## 2021-12-22 DIAGNOSIS — I4819 Other persistent atrial fibrillation: Secondary | ICD-10-CM | POA: Diagnosis not present

## 2021-12-23 DIAGNOSIS — I4819 Other persistent atrial fibrillation: Secondary | ICD-10-CM | POA: Diagnosis not present

## 2022-01-02 DIAGNOSIS — Z23 Encounter for immunization: Secondary | ICD-10-CM | POA: Diagnosis not present

## 2022-01-02 DIAGNOSIS — H34232 Retinal artery branch occlusion, left eye: Secondary | ICD-10-CM | POA: Diagnosis not present

## 2022-01-02 DIAGNOSIS — H5462 Unqualified visual loss, left eye, normal vision right eye: Secondary | ICD-10-CM | POA: Diagnosis not present

## 2022-01-02 DIAGNOSIS — I4819 Other persistent atrial fibrillation: Secondary | ICD-10-CM | POA: Diagnosis not present

## 2022-01-02 DIAGNOSIS — I160 Hypertensive urgency: Secondary | ICD-10-CM | POA: Diagnosis not present

## 2022-02-07 MED ORDER — SODIUM CHLORIDE 0.9 % IV SOLN: INTRAVENOUS | Status: DC

## 2022-02-08 DIAGNOSIS — I4819 Other persistent atrial fibrillation: Secondary | ICD-10-CM

## 2022-02-14 ENCOUNTER — Ambulatory Visit: Payer: Medicare HMO | Admitting: General Practice

## 2022-02-14 ENCOUNTER — Encounter: Admission: RE | Payer: Self-pay | Source: Ambulatory Visit

## 2022-02-14 ENCOUNTER — Ambulatory Visit: Admission: RE | Admit: 2022-02-14 | Payer: Medicare HMO | Source: Ambulatory Visit | Admitting: Cardiology

## 2022-02-14 DIAGNOSIS — I4819 Other persistent atrial fibrillation: Secondary | ICD-10-CM

## 2022-02-14 DIAGNOSIS — I160 Hypertensive urgency: Secondary | ICD-10-CM | POA: Diagnosis not present

## 2022-02-14 HISTORY — PX: CARDIOVERSION: SHX1299

## 2022-02-14 SURGERY — CARDIOVERSION
Anesthesia: General

## 2022-02-14 MED ORDER — PROPOFOL 10 MG/ML IV BOLUS
INTRAVENOUS | Status: AC
  Filled 2022-02-14: qty 40

## 2022-02-14 NOTE — Anesthesia Preprocedure Evaluation (Addendum)
Anesthesia Evaluation    Airway        Dental   Pulmonary           Cardiovascular hypertension, + dysrhythmias (a fib on Xarelto)  Rhythm:Irregular Rate:Normal  Echo 12/23/21:  NORMAL LEFT VENTRICULAR SYSTOLIC FUNCTION  NORMAL RIGHT VENTRICULAR SYSTOLIC FUNCTION  MILD VALVULAR REGURGITATION  NO VALVULAR STENOSIS    Neuro/Psych    GI/Hepatic   Endo/Other    Renal/GU      Musculoskeletal   Abdominal   Peds  Hematology   Anesthesia Other Findings Cardiology note 01/02/22:  66 year old female with recent diagnosis of atrial fibrillation while vacationing in [place] in the setting of pneumonia on Xarelto for stroke prevention, remains in atrial fibrillation, asymptomatic. 72-hour Holter monitor 12/08/2021 - 12/11/2021 revealed predominant atrial fibrillation with mean heart rate of 74 bpm. 2D echocardiogram 12/23/2021 revealed LVEF greater than 55%. Patient has essential hypertension, blood pressure well controlled on low-dose amlodipine. We discussed in detail the risk, benefits and alternatives of chronic anticoagulation. We also discussed the risk, benefits and alternatives of electrical cardioversion.  Plan  1. Continue current medications 2. Counseled patient about low-sodium diet 3. DASH diet printed instructions given to the patient 4. Continue Xarelto 5. Add flecainide 50 mg twice daily 6. Proceed with electrical cardioversion 7. Return to clinic after electrical cardioversion    Reproductive/Obstetrics                             Anesthesia Physical Anesthesia Plan  ASA: 3  Anesthesia Plan: General   Post-op Pain Management:    Induction: Intravenous  PONV Risk Score and Plan: 3 and Propofol infusion, TIVA and Treatment may vary due to age or medical condition  Airway Management Planned: Natural Airway  Additional Equipment:   Intra-op Plan:   Post-operative Plan:    Informed Consent:   Plan Discussed with:   Anesthesia Plan Comments: (Update 02/14/22: Case canceled for NPO violation.)        Anesthesia Quick Evaluation

## 2022-02-15 ENCOUNTER — Encounter: Payer: Self-pay | Admitting: Cardiology

## 2022-02-28 ENCOUNTER — Other Ambulatory Visit: Payer: Self-pay

## 2022-02-28 ENCOUNTER — Ambulatory Visit: Payer: Medicare HMO | Admitting: Anesthesiology

## 2022-02-28 ENCOUNTER — Ambulatory Visit
Admission: RE | Admit: 2022-02-28 | Discharge: 2022-02-28 | Disposition: A | Discharge status: Home / Self Care | Payer: Medicare HMO | Source: Ambulatory Visit | Attending: Cardiology | Admitting: Cardiology

## 2022-02-28 ENCOUNTER — Encounter: Payer: Self-pay | Admitting: Cardiology

## 2022-02-28 ENCOUNTER — Ambulatory Visit: Admit: 2022-02-28 | Payer: Medicare HMO | Admitting: Cardiology

## 2022-02-28 ENCOUNTER — Encounter
Admission: RE | Disposition: A | Discharge status: Home / Self Care | Payer: Self-pay | Source: Ambulatory Visit | Attending: Cardiology

## 2022-02-28 DIAGNOSIS — I10 Essential (primary) hypertension: Secondary | ICD-10-CM | POA: Diagnosis not present

## 2022-02-28 DIAGNOSIS — Z7901 Long term (current) use of anticoagulants: Secondary | ICD-10-CM | POA: Insufficient documentation

## 2022-02-28 DIAGNOSIS — I4891 Unspecified atrial fibrillation: Secondary | ICD-10-CM | POA: Diagnosis not present

## 2022-02-28 DIAGNOSIS — I4819 Other persistent atrial fibrillation: Secondary | ICD-10-CM

## 2022-02-28 DIAGNOSIS — I480 Paroxysmal atrial fibrillation: Secondary | ICD-10-CM | POA: Diagnosis not present

## 2022-02-28 HISTORY — PX: CARDIOVERSION: SHX1299

## 2022-02-28 SURGERY — CARDIOVERSION
Anesthesia: General

## 2022-02-28 MED ORDER — PROPOFOL 10 MG/ML IV BOLUS
INTRAVENOUS | Status: DC | PRN
  Administered 2022-02-28: 60 mg via INTRAVENOUS

## 2022-02-28 MED ORDER — SODIUM CHLORIDE 0.9 % IV SOLN: INTRAVENOUS | Status: DC

## 2022-02-28 NOTE — Op Note (Signed)
[facility] Cardiology   02/28/2022                     7:41 AM  PATIENT:  Rachel Whitehead    PRE-OPERATIVE DIAGNOSIS:  Cardioversion  AFib  POST-OPERATIVE DIAGNOSIS:  Same  PROCEDURE:  CARDIOVERSION  SURGEON:  Marcina Millard, MD    ANESTHESIA:     PREOPERATIVE INDICATIONS:  [name] is a  66 y.o. female with a diagnosis of Cardioversion  AFib who failed conservative measures and elected for surgical management.    The risks benefits and alternatives were discussed with the patient preoperatively including but not limited to the risks of infection, bleeding, cardiopulmonary complications, the need for revision surgery, among others, and the patient was willing to proceed.   OPERATIVE PROCEDURE: The patient arrived in a fasting state.  ECG revealed atrial fibrillation.  She received 60 mg of propofol.  Electrical cardioversion was performed with 75 and 120 J with successful conversion to sinus rhythm.  There were no periprocedural complications.

## 2022-02-28 NOTE — Anesthesia Preprocedure Evaluation (Signed)
Anesthesia Evaluation  Patient identified by MRN, date of birth, ID band Patient awake    Reviewed: Allergy & Precautions, NPO status , Patient's Chart, lab work & pertinent test results  History of Anesthesia Complications Negative for: history of anesthetic complications  Airway Mallampati: II  TM Distance: >3 FB Neck ROM: Full    Dental no notable dental hx. (+) Teeth Intact   Pulmonary neg pulmonary ROS, neg sleep apnea, neg COPD, Patient abstained from smoking.Not current smoker   Pulmonary exam normal breath sounds clear to auscultation       Cardiovascular Exercise Tolerance: Good METShypertension, Pt. on medications (-) CAD and (-) Past MI + dysrhythmias Atrial Fibrillation  Rhythm:Regular Rate:Normal - Systolic murmurs    Neuro/Psych negative neurological ROS  negative psych ROS   GI/Hepatic ,neg GERD  ,,(+)     (-) substance abuse    Endo/Other  neg diabetes    Renal/GU negative Renal ROS     Musculoskeletal   Abdominal   Peds  Hematology   Anesthesia Other Findings Past Medical History: No date: Hypertension No date: Urinary incontinence  Reproductive/Obstetrics                             Anesthesia Physical Anesthesia Plan  ASA: 2  Anesthesia Plan: General   Post-op Pain Management: Minimal or no pain anticipated   Induction: Intravenous  PONV Risk Score and Plan: 3 and Propofol infusion, TIVA and Ondansetron  Airway Management Planned: Nasal Cannula  Additional Equipment: None  Intra-op Plan:   Post-operative Plan:   Informed Consent: I have reviewed the patients History and Physical, chart, labs and discussed the procedure including the risks, benefits and alternatives for the proposed anesthesia with the patient or authorized representative who has indicated his/her understanding and acceptance.     Dental advisory given  Plan Discussed with: CRNA  and Surgeon  Anesthesia Plan Comments: (Discussed risks of anesthesia with patient, including possibility of difficulty with spontaneous ventilation under anesthesia necessitating airway intervention, PONV, and rare risks such as cardiac or respiratory or neurological events, and allergic reactions. Discussed the role of CRNA in patient's perioperative care. Patient understands.)       Anesthesia Quick Evaluation

## 2022-02-28 NOTE — Anesthesia Postprocedure Evaluation (Signed)
Anesthesia Post Note  Patient: Rachel Whitehead  Procedure(s) Performed: CARDIOVERSION  Patient location during evaluation: Specials Recovery Anesthesia Type: General Level of consciousness: awake and alert Pain management: pain level controlled Vital Signs Assessment: post-procedure vital signs reviewed and stable Respiratory status: spontaneous breathing, nonlabored ventilation, respiratory function stable and patient connected to nasal cannula oxygen Cardiovascular status: blood pressure returned to baseline and stable Postop Assessment: no apparent nausea or vomiting Anesthetic complications: no   No notable events documented.   Last Vitals:  Vitals:   02/28/22 0733 02/28/22 0734  BP: (!) 151/98   Pulse: 63 61  Resp: (!) 26 16  Temp:    SpO2: 93% 94%    Last Pain:  Vitals:   02/28/22 0720  TempSrc: Oral  PainSc: 0-No pain                 Corinda Gubler

## 2022-02-28 NOTE — Transfer of Care (Signed)
Immediate Anesthesia Transfer of Care Note  Patient: Rachel Whitehead  Procedure(s) Performed: CARDIOVERSION  Patient Location:  spu  Anesthesia Type:General  Level of Consciousness: awake, alert , and oriented  Airway & Oxygen Therapy: Patient Spontanous Breathing and Patient connected to nasal cannula oxygen  Post-op Assessment: Report given to RN and Post -op Vital signs reviewed and stable  Post vital signs: Reviewed  Last Vitals:  Vitals Value Taken Time  BP 151/98 02/28/22 0733  Temp 37.1 C 02/28/22 0720  Pulse 59 02/28/22 0735  Resp 14 02/28/22 0735  SpO2 96 % 02/28/22 0735  Vitals shown include unvalidated device data.  Last Pain:  Vitals:   02/28/22 0720  TempSrc: Oral  PainSc: 0-No pain         Complications: No notable events documented.

## 2022-03-01 ENCOUNTER — Encounter: Payer: Self-pay | Admitting: Cardiology

## 2022-03-07 ENCOUNTER — Encounter: Payer: Self-pay | Admitting: Family Medicine

## 2022-03-07 ENCOUNTER — Ambulatory Visit (INDEPENDENT_AMBULATORY_CARE_PROVIDER_SITE_OTHER): Payer: Medicare HMO | Admitting: Family Medicine

## 2022-03-07 ENCOUNTER — Other Ambulatory Visit: Payer: Self-pay | Admitting: Family Medicine

## 2022-03-07 VITALS — BP 130/80 | HR 68 | Ht 64.0 in | Wt 173.0 lb

## 2022-03-07 DIAGNOSIS — J3089 Other allergic rhinitis: Secondary | ICD-10-CM

## 2022-03-07 DIAGNOSIS — I10 Essential (primary) hypertension: Secondary | ICD-10-CM | POA: Diagnosis not present

## 2022-03-07 DIAGNOSIS — E559 Vitamin D deficiency, unspecified: Secondary | ICD-10-CM | POA: Diagnosis not present

## 2022-03-07 DIAGNOSIS — I4819 Other persistent atrial fibrillation: Secondary | ICD-10-CM

## 2022-03-07 DIAGNOSIS — I480 Paroxysmal atrial fibrillation: Secondary | ICD-10-CM | POA: Diagnosis not present

## 2022-03-07 DIAGNOSIS — E538 Deficiency of other specified B group vitamins: Secondary | ICD-10-CM | POA: Diagnosis not present

## 2022-03-07 DIAGNOSIS — R053 Chronic cough: Secondary | ICD-10-CM

## 2022-03-07 DIAGNOSIS — Z0000 Encounter for general adult medical examination without abnormal findings: Secondary | ICD-10-CM | POA: Diagnosis not present

## 2022-03-07 DIAGNOSIS — Z23 Encounter for immunization: Secondary | ICD-10-CM | POA: Diagnosis not present

## 2022-03-07 NOTE — Progress Notes (Signed)
Subjective:    Patient ID: Rachel Whitehead, female    DOB: May 24, 1955, 66 y.o.   MRN: [id number]  Rachel Whitehead is a 66 y.o. female presenting on 03/07/2022 for Annual Exam   HPI  Here for Annual Physical and Lab Review  Persistent Atrial Fibrillation Followed by [facility] [place] Cardiology Dr Saralyn Pilar Cardioversion completed 02/28/22, successful procedure She remains on Flecainide and Xarelto daily Has follow-up today with Cardiology at 1130am  CHRONIC HTN: Reports chronic history of elevated BP. Home BP 130-140 range Home BP readings have been normal Current Meds - Amlodipine 2.73m daily   Reports good compliance, took meds today. Tolerating well, w/o complaints. Lifestyle: - Diet: balanced, she does a cleanse - Exercise: walking, gardening, active. Denies CP, dyspnea, HA, edema, dizziness / lightheadedness   Supplement list PlasmaFlo StemEnhance Ultra Stem Cell Support Cyactiv - Inflammation Defend - Dietary Supplement - Immune Support Z-Stack - Dr ZSharalyn InkVitamin 0 Zinc, Quercetin, Vitamin C, Vitamin D  Bio SYNC - Gundry MD - PhytoSure, BioMax  Blood Pressure Optimizer - Potassium  Energize - Dietary Vegetarian supplement - AS NEEDED    Health Maintenance:   Breast CA Screening: Due for mammogram screening. Last mammogram result >2+ years ago reported normal. No prior history abnormal mammogram. No known family history of breast cancer. Currently asymptomatic. - She is not ready for new order. Declined to pursue this.   Colon CA Screening: Never had colonoscopy. Currently asymptomatic. No known family history of colon CA  She is interested in Cologuard, not ready for order      03/07/2022   10:23 AM 11/30/2021    1:59 PM 09/23/2021   10:56 AM  Depression screen PHQ 2/9  Decreased Interest 0 0 0  Down, Depressed, Hopeless 0 0 0  PHQ - 2 Score 0 0 0  Altered sleeping _0 Tired, decreased energy _1 Change in appetite 0 0 0  Feeling bad or failure about yourself  0  0 0  Trouble concentrating _2 Moving slowly or fidgety/restless 0 0 0  Suicidal thoughts 0 0 0  PHQ-9 Score _3 Difficult doing work/chores Not difficult at all Somewhat difficult Not difficult at all    Past Medical History:  Diagnosis Date   Hypertension    Urinary incontinence    Past Surgical History:  Procedure Laterality Date   CARDIOVERSION N/A 02/14/2022   Procedure: CARDIOVERSION;  Surgeon: PIsaias Cowman MD;  Location: [facility] ORS;  Service: Cardiovascular;  Laterality: N/A;   CARDIOVERSION N/A 02/28/2022   Procedure: CARDIOVERSION;  Surgeon: PIsaias Cowman MD;  Location: [facility] ORS;  Service: Cardiovascular;  Laterality: N/A;   STRABISMUS SURGERY Left    1961   Social History   Socioeconomic History   Marital status: Single    Spouse name: Not on file   Number of children: Not on file   Years of education: Not on file   Highest education level: Not on file  Occupational History   Not on file  Tobacco Use   Smoking status: Never   Smokeless tobacco: Not on file  Substance and Sexual Activity   Alcohol use: Yes    Alcohol/week: 4.0 standard drinks of alcohol    Types: 4 Standard drinks or equivalent per week   Drug use: Not on file   Sexual activity: Not on file  Other Topics Concern   Not on file  Social History Narrative   Not on file   Social  Determinants of Health   Financial Resource Strain: Not on file  Food Insecurity: Not on file  Transportation Needs: Not on file  Physical Activity: Not on file  Stress: Not on file  Social Connections: Not on file  Intimate Partner Violence: Not on file   Family History  Problem Relation Age of Onset   Cancer Father    Breast cancer Sister 63   Current Outpatient Medications on File Prior to Visit  Medication Sig   amLODipine (NORVASC) 2.5 MG tablet Take 1 tablet (2.5 mg total) by mouth daily.   flecainide (TAMBOCOR) 50 MG tablet Take 50 mg by mouth 2 (two) times daily.   rivaroxaban  (XARELTO) 20 MG TABS tablet Take 20 mg by mouth daily with supper.   Current Facility-Administered Medications on File Prior to Visit  Medication   0.9 %  sodium chloride infusion    Review of Systems  Constitutional:  Negative for activity change, appetite change, chills, diaphoresis, fatigue and fever.  HENT:  Negative for congestion and hearing loss.   Eyes:  Negative for visual disturbance.  Respiratory:  Negative for cough, chest tightness, shortness of breath and wheezing.   Cardiovascular:  Negative for chest pain, palpitations and leg swelling.  Gastrointestinal:  Negative for abdominal pain, constipation, diarrhea, nausea and vomiting.  Genitourinary:  Negative for dysuria, frequency and hematuria.  Musculoskeletal:  Negative for arthralgias and neck pain.  Skin:  Negative for rash.  Neurological:  Negative for dizziness, weakness, light-headedness, numbness and headaches.  Hematological:  Negative for adenopathy.  Psychiatric/Behavioral:  Negative for behavioral problems, dysphoric mood and sleep disturbance.    Per HPI unless specifically indicated above      Objective:    BP 130/80 (BP Location: Left Arm, Cuff Size: Normal)   Pulse 68   Ht _0  (1.626 m)   Wt 173 lb (78.5 kg)   SpO2 100%   BMI 29.70 kg/m   Wt Readings from Last 3 Encounters:  03/07/22 173 lb (78.5 kg)  02/28/22 170 lb (77.1 kg)  11/30/21 173 lb 12.8 oz (78.8 kg)    Physical Exam Vitals and nursing note reviewed.  Constitutional:      General: She is not in acute distress.    Appearance: She is well-developed. She is not diaphoretic.     Comments: Well-appearing, comfortable, cooperative  HENT:     Head: Normocephalic and atraumatic.  Eyes:     General:        Right eye: No discharge.        Left eye: No discharge.     Conjunctiva/sclera: Conjunctivae normal.     Pupils: Pupils are equal, round, and reactive to light.  Neck:     Thyroid: No thyromegaly.  Cardiovascular:     Rate and  Rhythm: Normal rate. Rhythm irregular.     Pulses: Normal pulses.     Heart sounds: Normal heart sounds. No murmur heard.    Comments: Irregularly irregular Pulmonary:     Effort: Pulmonary effort is normal. No respiratory distress.     Breath sounds: Normal breath sounds. No wheezing or rales.  Abdominal:     General: Bowel sounds are normal. There is no distension.     Palpations: Abdomen is soft. There is no mass.     Tenderness: There is no abdominal tenderness.  Musculoskeletal:        General: No tenderness. Normal range of motion.     Cervical back: Normal range of motion and neck  supple.     Comments: Upper / Lower Extremities: - Normal muscle tone, strength bilateral upper extremities 5/5, lower extremities 5/5  Lymphadenopathy:     Cervical: No cervical adenopathy.  Skin:    General: Skin is warm and dry.     Findings: No erythema or rash.  Neurological:     Mental Status: She is alert and oriented to person, place, and time.     Comments: Distal sensation intact to light touch all extremities  Psychiatric:        Mood and Affect: Mood normal.        Behavior: Behavior normal.        Thought Content: Thought content normal.     Comments: Well groomed, good eye contact, normal speech and thoughts       Results for orders placed or performed in visit on 03/25/21  COMPLETE METABOLIC PANEL WITH GFR  Result Value Ref Range   Glucose, Bld 90 65 - 139 mg/dL   BUN 12 7 - 25 mg/dL   Creat 0.64 0.50 - 1.05 mg/dL   eGFR 97 > OR = 60 mL/min/1.47m   BUN/Creatinine Ratio NOT APPLICABLE 6 - 22 (calc)   Sodium 141 135 - 146 mmol/L   Potassium 4.6 3.5 - 5.3 mmol/L   Chloride 108 98 - 110 mmol/L   CO2 27 20 - 32 mmol/L   Calcium 9.4 8.6 - 10.4 mg/dL   Total Protein 6.6 6.1 - 8.1 g/dL   Albumin 4.3 3.6 - 5.1 g/dL   Globulin 2.3 1.9 - 3.7 g/dL (calc)   AG Ratio 1.9 1.0 - 2.5 (calc)   Total Bilirubin 0.5 0.2 - 1.2 mg/dL   Alkaline phosphatase (APISO) 71 37 - 153 U/L   AST  33 10 - 35 U/L   ALT 32 (H) 6 - 29 U/L  Lipid panel  Result Value Ref Range   Cholesterol 191 <200 mg/dL   HDL 57 > OR = 50 mg/dL   Triglycerides 78 <150 mg/dL   LDL Cholesterol (Calc) 117 (H) mg/dL (calc)   Total CHOL/HDL Ratio 3.4 <5.0 (calc)   Non-HDL Cholesterol (Calc) 134 (H) <130 mg/dL (calc)  CBC with Differential/Platelet  Result Value Ref Range   WBC 5.1 3.8 - 10.8 Thousand/uL   RBC 4.29 3.80 - 5.10 Million/uL   Hemoglobin 13.2 11.7 - 15.5 g/dL   HCT 39.1 35.0 - 45.0 %   MCV 91.1 80.0 - 100.0 fL   MCH 30.8 27.0 - 33.0 pg   MCHC 33.8 32.0 - 36.0 g/dL   RDW 12.9 11.0 - 15.0 %   Platelets 256 140 - 400 Thousand/uL   MPV 11.9 7.5 - 12.5 fL   Neutro Abs 2,586 1,500 - 7,800 cells/uL   Lymphs Abs 1,826 850 - 3,900 cells/uL   Absolute Monocytes 408 200 - 950 cells/uL   Eosinophils Absolute 240 15 - 500 cells/uL   Basophils Absolute 41 0 - 200 cells/uL   Neutrophils Relative % 50.7 %   Total Lymphocyte 35.8 %   Monocytes Relative 8.0 %   Eosinophils Relative 4.7 %   Basophils Relative 0.8 %      Assessment & Plan:   Problem List Items Addressed This Visit     Essential hypertension    Controlled HTN, mild elevated repeat improved. - Home BP readings reviewed  No known complications    Plan:  1. Continue current BP regimen low dose Amlodipine 2.525mdaily - in future may consider titrate off med  but however expressed caution with risk of elevated BP again off med 2. Encourage improved lifestyle - low sodium diet, regular exercise 3. Continue monitor BP outside office, bring readings to next visit, if persistently >140/90 or new symptoms notify office sooner      Persistent atrial fibrillation ([place])    Followed by [facility] Cardiology New onset earlier in 2023 provoked by illness Has had persistent problem S/p Cardioversion, pending results today from Cardiology Concern may still be in AFib She is on Flecainide and Xarelto anticoag      Other Visit Diagnoses      Annual physical exam    -  Primary   Vitamin B12 nutritional deficiency       Vitamin D deficiency       Seasonal allergic rhinitis due to other allergic trigger       Persistent dry cough           Updated Health Maintenance information Fasting lab panel today, pending results. Encouraged improvement to lifestyle with diet and exercise Goal of weight loss  Future Chest X-ray when ready, for history of dry cough Prior history walking pneumonia months ago, seems to have resolved but she still has cough  No orders of the defined types were placed in this encounter.     Follow up plan: Return in about 6 months (around 09/06/2022) for 6 month follow-up HTN.  Nobie Putnam, DO [facility] 03/07/2022, 10:08 AM

## 2022-03-07 NOTE — Patient Instructions (Addendum)
Thank you for coming to the office today.  Return for Chest X-ray anytime, Mon-Thurs, not Friday. 8am-4pm After X-ray can order Nasal Spray if interested Start nasal steroid Flonase 2 sprays in each nostril daily for 4-6 weeks, may repeat course seasonally or as needed  -------  Labs today, stay tuned for results. On MyChart If questions  Annual Medicare Wellness visit with our nurse in person or on phone, anytime you are interested. Free once per year for medicare updates  Mammogram and Bone Density DEXA scan can be ordered.  Colon CA Screening is due  [email or web address]  Colon Cancer Screening: - For all adults age 90+ routine colon cancer screening is highly recommended.     - Recent guidelines from [place] recommend starting age of 27 - Early detection of colon cancer is important, because often there are no warning signs or symptoms, also if found early usually it can be cured. Late stage is hard to treat.  - If you are not interested in Colonoscopy screening (if done and normal you could be cleared for 5 to 10 years until next due), then Cologuard is an excellent alternative for screening test for Colon Cancer. It is highly sensitive for detecting DNA of colon cancer from even the earliest stages. Also, there is NO bowel prep required. - If Cologuard is NEGATIVE, then it is good for 3 years before next due - If Cologuard is POSITIVE, then it is strongly advised to get a Colonoscopy, which allows the GI doctor to locate the source of the cancer or polyp (even very early stage) and treat it by removing it. ------------------------- If you would like to proceed with Cologuard (stool DNA test) - FIRST, call your insurance company and tell them you want to check cost of Cologuard tell them CPT Code [phone] (it may be completely covered and you could get for no cost, OR max cost without any coverage is about $600). Also, keep in mind if you do NOT open the kit, and  decide not to do the test, you will NOT be charged, you should contact the company if you decide not to do the test. - If you want to proceed, you can notify us (phone message, [place], or at next visit) and we will order it for you. The test kit will be delivered to you house within about 1 week. Follow instructions to collect sample, you may call the company for any help or questions, 24/7 telephone support at [phone].   Please schedule a Follow-up Appointment to: Return in about 6 months (around 09/06/2022) for 6 month follow-up HTN.  If you have any other questions or concerns, please feel free to call the office or send a message through [place]. You may also schedule an earlier appointment if necessary.  Additionally, you may be receiving a survey about your experience at our office within a few days to 1 week by e-mail or mail. We value your feedback.  Nobie Putnam, DO [place]

## 2022-03-07 NOTE — Assessment & Plan Note (Signed)
Controlled HTN, mild elevated repeat improved. - Home BP readings reviewed  No known complications    Plan:  1. Continue current BP regimen low dose Amlodipine 2.5mg  daily - in future may consider titrate off med but however expressed caution with risk of elevated BP again off med 2. Encourage improved lifestyle - low sodium diet, regular exercise 3. Continue monitor BP outside office, bring readings to next visit, if persistently >140/90 or new symptoms notify office sooner

## 2022-03-07 NOTE — Assessment & Plan Note (Signed)
Followed by [facility] Cardiology New onset earlier in 2023 provoked by illness Has had persistent problem S/p Cardioversion, pending results today from Cardiology Concern may still be in AFib She is on Flecainide and Xarelto anticoag

## 2022-03-08 ENCOUNTER — Ambulatory Visit
Admission: RE | Admit: 2022-03-08 | Discharge: 2022-03-08 | Disposition: A | Discharge status: Home / Self Care | Payer: Medicare HMO | Attending: Family Medicine | Admitting: Family Medicine

## 2022-03-08 ENCOUNTER — Ambulatory Visit
Admission: RE | Admit: 2022-03-08 | Discharge: 2022-03-08 | Disposition: A | Discharge status: Home / Self Care | Payer: Medicare HMO | Source: Ambulatory Visit | Attending: Family Medicine | Admitting: Family Medicine

## 2022-03-08 DIAGNOSIS — R053 Chronic cough: Secondary | ICD-10-CM | POA: Insufficient documentation

## 2022-03-08 DIAGNOSIS — R059 Cough, unspecified: Secondary | ICD-10-CM | POA: Diagnosis not present

## 2022-03-08 LAB — CBC WITH DIFFERENTIAL/PLATELET
Absolute Monocytes: 385 cells/uL (ref 200–950)
Basophils Absolute: 28 cells/uL (ref 0–200)
Basophils Relative: 0.5 %
Eosinophils Absolute: 545 cells/uL — ABNORMAL HIGH (ref 15–500)
Eosinophils Relative: 9.9 %
HCT: 40.5 % (ref 35.0–45.0)
Hemoglobin: 13.7 g/dL (ref 11.7–15.5)
Lymphs Abs: 1260 cells/uL (ref 850–3900)
MCH: 31.1 pg (ref 27.0–33.0)
MCHC: 33.8 g/dL (ref 32.0–36.0)
MCV: 92 fL (ref 80.0–100.0)
MPV: 10.8 fL (ref 7.5–12.5)
Monocytes Relative: 7 %
Neutro Abs: 3284 cells/uL (ref 1500–7800)
Neutrophils Relative %: 59.7 %
Platelets: 286 10*3/uL (ref 140–400)
RBC: 4.4 10*6/uL (ref 3.80–5.10)
RDW: 12.6 % (ref 11.0–15.0)
Total Lymphocyte: 22.9 %
WBC: 5.5 10*3/uL (ref 3.8–10.8)

## 2022-03-08 LAB — COMPLETE METABOLIC PANEL WITH GFR
AG Ratio: 1.6 (calc) (ref 1.0–2.5)
ALT: 37 U/L — ABNORMAL HIGH (ref 6–29)
AST: 32 U/L (ref 10–35)
Albumin: 4.4 g/dL (ref 3.6–5.1)
Alkaline phosphatase (APISO): 81 U/L (ref 37–153)
BUN: 15 mg/dL (ref 7–25)
CO2: 27 mmol/L (ref 20–32)
Calcium: 9.4 mg/dL (ref 8.6–10.4)
Chloride: 106 mmol/L (ref 98–110)
Creat: 0.54 mg/dL (ref 0.50–1.05)
Globulin: 2.8 g/dL (calc) (ref 1.9–3.7)
Glucose, Bld: 84 mg/dL (ref 65–99)
Potassium: 4.6 mmol/L (ref 3.5–5.3)
Sodium: 141 mmol/L (ref 135–146)
Total Bilirubin: 0.6 mg/dL (ref 0.2–1.2)
Total Protein: 7.2 g/dL (ref 6.1–8.1)
eGFR: 100 mL/min/{1.73_m2} (ref >=60)

## 2022-03-08 LAB — LIPID PANEL
Cholesterol: 185 mg/dL (ref <200)
HDL: 66 mg/dL (ref >=50)
LDL Cholesterol (Calc): 102 mg/dL (calc) — ABNORMAL HIGH
Non-HDL Cholesterol (Calc): 119 mg/dL (calc) (ref <130)
Total CHOL/HDL Ratio: 2.8 (calc) (ref <5.0)
Triglycerides: 78 mg/dL (ref <150)

## 2022-03-08 LAB — VITAMIN B12: Vitamin B-12: 402 pg/mL (ref 200–1100)

## 2022-03-08 LAB — VITAMIN D 25 HYDROXY (VIT D DEFICIENCY, FRACTURES): Vit D, 25-Hydroxy: 43 ng/mL (ref 30–100)

## 2022-03-08 LAB — TSH: TSH: 2.44 mIU/L (ref 0.40–4.50)

## 2022-03-22 ENCOUNTER — Ambulatory Visit: Admission: RE | Admit: 2022-03-22 | Payer: Medicare HMO | Source: Ambulatory Visit | Admitting: Cardiology

## 2022-03-22 ENCOUNTER — Encounter: Admission: RE | Payer: Self-pay | Source: Ambulatory Visit

## 2022-03-22 SURGERY — CARDIOVERSION
Anesthesia: General

## 2022-03-30 ENCOUNTER — Other Ambulatory Visit: Payer: Self-pay

## 2022-03-30 ENCOUNTER — Ambulatory Visit
Admission: RE | Admit: 2022-03-30 | Discharge: 2022-03-30 | Disposition: A | Discharge status: Home / Self Care | Payer: Medicare HMO | Attending: Cardiology | Admitting: Cardiology

## 2022-03-30 ENCOUNTER — Ambulatory Visit: Payer: Medicare HMO | Admitting: Anesthesiology

## 2022-03-30 ENCOUNTER — Encounter: Payer: Self-pay | Admitting: Cardiology

## 2022-03-30 ENCOUNTER — Encounter
Admission: RE | Disposition: A | Discharge status: Home / Self Care | Payer: Self-pay | Source: Home / Self Care | Attending: Cardiology

## 2022-03-30 DIAGNOSIS — H34232 Retinal artery branch occlusion, left eye: Secondary | ICD-10-CM | POA: Diagnosis not present

## 2022-03-30 DIAGNOSIS — Z7901 Long term (current) use of anticoagulants: Secondary | ICD-10-CM | POA: Insufficient documentation

## 2022-03-30 DIAGNOSIS — I480 Paroxysmal atrial fibrillation: Secondary | ICD-10-CM | POA: Diagnosis not present

## 2022-03-30 DIAGNOSIS — I4891 Unspecified atrial fibrillation: Secondary | ICD-10-CM | POA: Diagnosis not present

## 2022-03-30 DIAGNOSIS — I10 Essential (primary) hypertension: Secondary | ICD-10-CM | POA: Diagnosis not present

## 2022-03-30 DIAGNOSIS — Z79899 Other long term (current) drug therapy: Secondary | ICD-10-CM | POA: Diagnosis not present

## 2022-03-30 DIAGNOSIS — I4819 Other persistent atrial fibrillation: Secondary | ICD-10-CM | POA: Insufficient documentation

## 2022-03-30 DIAGNOSIS — R32 Unspecified urinary incontinence: Secondary | ICD-10-CM | POA: Diagnosis not present

## 2022-03-30 HISTORY — PX: CARDIOVERSION: SHX1299

## 2022-03-30 SURGERY — CARDIOVERSION
Anesthesia: General

## 2022-03-30 MED ORDER — SODIUM CHLORIDE 0.9 % IV SOLN: INTRAVENOUS | Status: DC

## 2022-03-30 MED ORDER — ONDANSETRON HCL 4 MG/2ML IJ SOLN: 4.0000 mg | Freq: Once | INTRAMUSCULAR | Status: DC | PRN

## 2022-03-30 MED ORDER — PROPOFOL 10 MG/ML IV BOLUS
INTRAVENOUS | Status: AC
  Filled 2022-03-30: qty 20

## 2022-03-30 MED ORDER — FENTANYL CITRATE (PF) 100 MCG/2ML IJ SOLN: 25.0000 ug | INTRAMUSCULAR | Status: DC | PRN

## 2022-03-30 MED ORDER — PROPOFOL 10 MG/ML IV BOLUS
INTRAVENOUS | Status: DC | PRN
  Administered 2022-03-30: 70 mg via INTRAVENOUS

## 2022-03-30 NOTE — Op Note (Signed)
[facility] Cardiology   03/30/2022                     8:04 AM  PATIENT:  [name]    PRE-OPERATIVE DIAGNOSIS:  Cardioversion  AFib   Okd Dr Randa Evens  POST-OPERATIVE DIAGNOSIS:  Same  PROCEDURE:  CARDIOVERSION  SURGEON:  Marcina Millard, MD    ANESTHESIA:     PREOPERATIVE INDICATIONS:  Rachel Whitehead is a  66 y.o. female with a diagnosis of Cardioversion  AFib   Okd Dr Randa Evens who failed conservative measures and elected for surgical management.    The risks benefits and alternatives were discussed with the patient preoperatively including but not limited to the risks of infection, bleeding, cardiopulmonary complications, the need for revision surgery, among others, and the patient was willing to proceed.   OPERATIVE PROCEDURE: The patient was brought to special procedures in the fasting state.  ECG revealed atrial fibrillation at a rate of 80 bpm.  The patient received 70 mg of propofol.  Electrical cardioversion was performed with 75 and 120 J with successful conversion to sinus rhythm.  There were no periprocedural complications.

## 2022-03-30 NOTE — Anesthesia Preprocedure Evaluation (Signed)
Anesthesia Evaluation  Patient identified by MRN, date of birth, ID band Patient awake    Reviewed: Allergy & Precautions, H&P , NPO status , Patient's Chart, lab work & pertinent test results, reviewed documented beta blocker date and time   Airway Mallampati: II   Neck ROM: full    Dental  (+) Poor Dentition   Pulmonary neg pulmonary ROS   Pulmonary exam normal        Cardiovascular Exercise Tolerance: Poor hypertension, On Medications negative cardio ROS Atrial Fibrillation  Rhythm:regular Rate:Normal     Neuro/Psych negative neurological ROS  negative psych ROS   GI/Hepatic negative GI ROS, Neg liver ROS,,,  Endo/Other  negative endocrine ROS    Renal/GU negative Renal ROS  negative genitourinary   Musculoskeletal   Abdominal   Peds  Hematology negative hematology ROS (+)   Anesthesia Other Findings Past Medical History: No date: Hypertension No date: Urinary incontinence Past Surgical History: 02/14/2022: CARDIOVERSION; N/A     Comment:  Procedure: CARDIOVERSION;  Surgeon: Marcina Millard, MD;  Location: [facility] ORS;  Service:               Cardiovascular;  Laterality: N/A; 02/28/2022: CARDIOVERSION; N/A     Comment:  Procedure: CARDIOVERSION;  Surgeon: Marcina Millard, MD;  Location: [facility] ORS;  Service:               Cardiovascular;  Laterality: N/A; No date: STRABISMUS SURGERY; Left     Comment:  1961   Reproductive/Obstetrics negative OB ROS                             Anesthesia Physical Anesthesia Plan  ASA: 4  Anesthesia Plan: General   Post-op Pain Management:    Induction:   PONV Risk Score and Plan:   Airway Management Planned:   Additional Equipment:   Intra-op Plan:   Post-operative Plan:   Informed Consent: I have reviewed the patients History and Physical, chart, labs and discussed the procedure including the  risks, benefits and alternatives for the proposed anesthesia with the patient or authorized representative who has indicated his/her understanding and acceptance.     Dental Advisory Given  Plan Discussed with: CRNA  Anesthesia Plan Comments:        Anesthesia Quick Evaluation

## 2022-03-30 NOTE — Anesthesia Procedure Notes (Signed)
Date/Time: 03/30/2022 7:49 AM  Performed by: Stormy Fabian, CRNAPre-anesthesia Checklist: Patient identified, Emergency Drugs available, Suction available and Patient being monitored Patient Re-evaluated:Patient Re-evaluated prior to induction Oxygen Delivery Method: Nasal cannula Induction Type: IV induction Dental Injury: Teeth and Oropharynx as per pre-operative assessment  Comments: Nasal cannula with etCO2 monitoring

## 2022-03-30 NOTE — Transfer of Care (Signed)
Immediate Anesthesia Transfer of Care Note  Patient: Rachel Whitehead  Procedure(s) Performed: Procedure(s): CARDIOVERSION (N/A)  Patient Location: PACU and Short Stay  Anesthesia Type:General  Level of Consciousness: awake, alert  and oriented  Airway & Oxygen Therapy: Patient Spontanous Breathing and Patient connected to nasal cannula oxygen  Post-op Assessment: Report given to RN and Post -op Vital signs reviewed and stable  Post vital signs: Reviewed and stable  Last Vitals:  Vitals:   03/30/22 0800 03/30/22 0801  BP: 139/89 139/89  Pulse: (!) 53   Resp: 17 17  Temp:    SpO2: 97% 97%    Complications: No apparent anesthesia complications

## 2022-03-31 ENCOUNTER — Encounter: Payer: Self-pay | Admitting: Cardiology

## 2022-03-31 NOTE — Anesthesia Postprocedure Evaluation (Signed)
Anesthesia Post Note  Patient: Monique Lupercio  Procedure(s) Performed: CARDIOVERSION  Patient location during evaluation: PACU Anesthesia Type: General Level of consciousness: awake and alert Pain management: pain level controlled Vital Signs Assessment: post-procedure vital signs reviewed and stable Respiratory status: spontaneous breathing, nonlabored ventilation, respiratory function stable and patient connected to nasal cannula oxygen Cardiovascular status: blood pressure returned to baseline and stable Postop Assessment: no apparent nausea or vomiting Anesthetic complications: no   No notable events documented.   Last Vitals:  Vitals:   03/30/22 0830 03/30/22 0845  BP: (!) 142/80 139/84  Pulse: 61 61  Resp: 10 15  Temp:    SpO2: 97% 93%    Last Pain:  Vitals:   03/30/22 0845  TempSrc:   PainSc: 0-No pain                 Yevette Edwards

## 2022-04-04 DIAGNOSIS — I4819 Other persistent atrial fibrillation: Secondary | ICD-10-CM | POA: Diagnosis not present

## 2022-04-04 DIAGNOSIS — I160 Hypertensive urgency: Secondary | ICD-10-CM | POA: Diagnosis not present

## 2022-04-04 DIAGNOSIS — H5462 Unqualified visual loss, left eye, normal vision right eye: Secondary | ICD-10-CM | POA: Diagnosis not present

## 2022-04-04 DIAGNOSIS — H34232 Retinal artery branch occlusion, left eye: Secondary | ICD-10-CM | POA: Diagnosis not present

## 2022-04-04 DIAGNOSIS — I480 Paroxysmal atrial fibrillation: Secondary | ICD-10-CM | POA: Diagnosis not present

## 2022-04-11 DIAGNOSIS — R0602 Shortness of breath: Secondary | ICD-10-CM | POA: Diagnosis not present

## 2022-04-11 DIAGNOSIS — G4733 Obstructive sleep apnea (adult) (pediatric): Secondary | ICD-10-CM | POA: Diagnosis not present

## 2022-04-12 DIAGNOSIS — I480 Paroxysmal atrial fibrillation: Secondary | ICD-10-CM | POA: Diagnosis not present

## 2022-04-12 DIAGNOSIS — G4733 Obstructive sleep apnea (adult) (pediatric): Secondary | ICD-10-CM | POA: Diagnosis not present

## 2022-04-12 DIAGNOSIS — R0602 Shortness of breath: Secondary | ICD-10-CM | POA: Diagnosis not present

## 2022-04-12 DIAGNOSIS — I4891 Unspecified atrial fibrillation: Secondary | ICD-10-CM | POA: Diagnosis not present

## 2022-04-21 DIAGNOSIS — G4733 Obstructive sleep apnea (adult) (pediatric): Secondary | ICD-10-CM | POA: Diagnosis not present

## 2022-05-05 DIAGNOSIS — I4891 Unspecified atrial fibrillation: Secondary | ICD-10-CM | POA: Diagnosis not present

## 2022-05-05 DIAGNOSIS — I4819 Other persistent atrial fibrillation: Secondary | ICD-10-CM | POA: Diagnosis not present

## 2022-05-17 DIAGNOSIS — I4891 Unspecified atrial fibrillation: Secondary | ICD-10-CM | POA: Diagnosis not present

## 2022-05-22 DIAGNOSIS — G4733 Obstructive sleep apnea (adult) (pediatric): Secondary | ICD-10-CM | POA: Diagnosis not present

## 2022-05-22 DIAGNOSIS — I4819 Other persistent atrial fibrillation: Secondary | ICD-10-CM | POA: Diagnosis not present

## 2022-05-22 DIAGNOSIS — Z7901 Long term (current) use of anticoagulants: Secondary | ICD-10-CM | POA: Diagnosis not present

## 2022-05-22 DIAGNOSIS — I160 Hypertensive urgency: Secondary | ICD-10-CM | POA: Diagnosis not present

## 2022-05-22 DIAGNOSIS — Z01810 Encounter for preprocedural cardiovascular examination: Secondary | ICD-10-CM | POA: Diagnosis not present

## 2022-05-30 DIAGNOSIS — G4733 Obstructive sleep apnea (adult) (pediatric): Secondary | ICD-10-CM | POA: Diagnosis not present

## 2022-05-30 DIAGNOSIS — R0602 Shortness of breath: Secondary | ICD-10-CM | POA: Diagnosis not present

## 2022-05-31 DIAGNOSIS — I440 Atrioventricular block, first degree: Secondary | ICD-10-CM | POA: Diagnosis not present

## 2022-05-31 DIAGNOSIS — H47012 Ischemic optic neuropathy, left eye: Secondary | ICD-10-CM | POA: Diagnosis not present

## 2022-05-31 DIAGNOSIS — G4733 Obstructive sleep apnea (adult) (pediatric): Secondary | ICD-10-CM | POA: Diagnosis not present

## 2022-05-31 DIAGNOSIS — H34232 Retinal artery branch occlusion, left eye: Secondary | ICD-10-CM | POA: Diagnosis not present

## 2022-05-31 DIAGNOSIS — Z7901 Long term (current) use of anticoagulants: Secondary | ICD-10-CM | POA: Diagnosis not present

## 2022-05-31 DIAGNOSIS — Z79899 Other long term (current) drug therapy: Secondary | ICD-10-CM | POA: Diagnosis not present

## 2022-05-31 DIAGNOSIS — H5462 Unqualified visual loss, left eye, normal vision right eye: Secondary | ICD-10-CM | POA: Diagnosis not present

## 2022-05-31 DIAGNOSIS — Z8673 Personal history of transient ischemic attack (TIA), and cerebral infarction without residual deficits: Secondary | ICD-10-CM | POA: Diagnosis not present

## 2022-05-31 DIAGNOSIS — I4819 Other persistent atrial fibrillation: Secondary | ICD-10-CM | POA: Diagnosis not present

## 2022-05-31 DIAGNOSIS — I10 Essential (primary) hypertension: Secondary | ICD-10-CM | POA: Diagnosis not present

## 2022-06-01 DIAGNOSIS — I10 Essential (primary) hypertension: Secondary | ICD-10-CM | POA: Diagnosis not present

## 2022-06-01 DIAGNOSIS — H47012 Ischemic optic neuropathy, left eye: Secondary | ICD-10-CM | POA: Diagnosis not present

## 2022-06-01 DIAGNOSIS — H34232 Retinal artery branch occlusion, left eye: Secondary | ICD-10-CM | POA: Diagnosis not present

## 2022-06-01 DIAGNOSIS — I440 Atrioventricular block, first degree: Secondary | ICD-10-CM | POA: Diagnosis not present

## 2022-06-01 DIAGNOSIS — Z79899 Other long term (current) drug therapy: Secondary | ICD-10-CM | POA: Diagnosis not present

## 2022-06-01 DIAGNOSIS — Z7901 Long term (current) use of anticoagulants: Secondary | ICD-10-CM | POA: Diagnosis not present

## 2022-06-01 DIAGNOSIS — H5462 Unqualified visual loss, left eye, normal vision right eye: Secondary | ICD-10-CM | POA: Diagnosis not present

## 2022-06-01 DIAGNOSIS — Z8673 Personal history of transient ischemic attack (TIA), and cerebral infarction without residual deficits: Secondary | ICD-10-CM | POA: Diagnosis not present

## 2022-06-01 DIAGNOSIS — G4733 Obstructive sleep apnea (adult) (pediatric): Secondary | ICD-10-CM | POA: Diagnosis not present

## 2022-06-01 DIAGNOSIS — I4891 Unspecified atrial fibrillation: Secondary | ICD-10-CM | POA: Diagnosis not present

## 2022-06-01 DIAGNOSIS — I4819 Other persistent atrial fibrillation: Secondary | ICD-10-CM | POA: Diagnosis not present

## 2022-06-16 DIAGNOSIS — I444 Left anterior fascicular block: Secondary | ICD-10-CM | POA: Diagnosis not present

## 2022-06-16 DIAGNOSIS — R9431 Abnormal electrocardiogram [ECG] [EKG]: Secondary | ICD-10-CM | POA: Diagnosis not present

## 2022-06-16 DIAGNOSIS — I4819 Other persistent atrial fibrillation: Secondary | ICD-10-CM | POA: Diagnosis not present

## 2022-06-16 DIAGNOSIS — I440 Atrioventricular block, first degree: Secondary | ICD-10-CM | POA: Diagnosis not present

## 2022-06-20 DIAGNOSIS — G4733 Obstructive sleep apnea (adult) (pediatric): Secondary | ICD-10-CM | POA: Diagnosis not present

## 2022-07-05 DIAGNOSIS — I4819 Other persistent atrial fibrillation: Secondary | ICD-10-CM | POA: Diagnosis not present

## 2022-07-05 DIAGNOSIS — Z9289 Personal history of other medical treatment: Secondary | ICD-10-CM | POA: Diagnosis not present

## 2022-07-05 DIAGNOSIS — I4891 Unspecified atrial fibrillation: Secondary | ICD-10-CM | POA: Diagnosis not present

## 2022-07-05 DIAGNOSIS — I160 Hypertensive urgency: Secondary | ICD-10-CM | POA: Diagnosis not present

## 2022-07-05 DIAGNOSIS — H34232 Retinal artery branch occlusion, left eye: Secondary | ICD-10-CM | POA: Diagnosis not present

## 2022-07-05 DIAGNOSIS — I10 Essential (primary) hypertension: Secondary | ICD-10-CM | POA: Diagnosis not present

## 2022-07-14 DIAGNOSIS — I4819 Other persistent atrial fibrillation: Secondary | ICD-10-CM | POA: Diagnosis not present

## 2022-07-19 DIAGNOSIS — G4733 Obstructive sleep apnea (adult) (pediatric): Secondary | ICD-10-CM | POA: Diagnosis not present

## 2022-07-21 DIAGNOSIS — G4733 Obstructive sleep apnea (adult) (pediatric): Secondary | ICD-10-CM | POA: Diagnosis not present

## 2022-08-02 DIAGNOSIS — Z789 Other specified health status: Secondary | ICD-10-CM | POA: Diagnosis not present

## 2022-08-02 DIAGNOSIS — Z6827 Body mass index (BMI) 27.0-27.9, adult: Secondary | ICD-10-CM | POA: Diagnosis not present

## 2022-08-02 DIAGNOSIS — Z9889 Other specified postprocedural states: Secondary | ICD-10-CM | POA: Diagnosis not present

## 2022-08-02 DIAGNOSIS — Z8679 Personal history of other diseases of the circulatory system: Secondary | ICD-10-CM | POA: Diagnosis not present

## 2022-08-02 DIAGNOSIS — G4733 Obstructive sleep apnea (adult) (pediatric): Secondary | ICD-10-CM | POA: Diagnosis not present

## 2022-08-03 DIAGNOSIS — G4733 Obstructive sleep apnea (adult) (pediatric): Secondary | ICD-10-CM | POA: Diagnosis not present

## 2022-08-10 DIAGNOSIS — H47012 Ischemic optic neuropathy, left eye: Secondary | ICD-10-CM | POA: Diagnosis not present

## 2022-08-10 DIAGNOSIS — I491 Atrial premature depolarization: Secondary | ICD-10-CM | POA: Diagnosis not present

## 2022-08-10 DIAGNOSIS — I440 Atrioventricular block, first degree: Secondary | ICD-10-CM | POA: Diagnosis not present

## 2022-08-10 DIAGNOSIS — I4510 Unspecified right bundle-branch block: Secondary | ICD-10-CM | POA: Diagnosis not present

## 2022-08-10 DIAGNOSIS — H2513 Age-related nuclear cataract, bilateral: Secondary | ICD-10-CM | POA: Diagnosis not present

## 2022-08-10 DIAGNOSIS — I498 Other specified cardiac arrhythmias: Secondary | ICD-10-CM | POA: Diagnosis not present

## 2022-08-10 DIAGNOSIS — Z79899 Other long term (current) drug therapy: Secondary | ICD-10-CM | POA: Diagnosis not present

## 2022-08-10 DIAGNOSIS — I4819 Other persistent atrial fibrillation: Secondary | ICD-10-CM | POA: Diagnosis not present

## 2022-08-10 DIAGNOSIS — Z0000 Encounter for general adult medical examination without abnormal findings: Secondary | ICD-10-CM | POA: Diagnosis not present

## 2022-08-16 DIAGNOSIS — G4733 Obstructive sleep apnea (adult) (pediatric): Secondary | ICD-10-CM | POA: Diagnosis not present

## 2022-08-20 DIAGNOSIS — G4733 Obstructive sleep apnea (adult) (pediatric): Secondary | ICD-10-CM | POA: Diagnosis not present

## 2022-08-20 IMAGING — MR MR SHOULDER*L* W/O CM
4 of 5 series · 33 of 40 positions shown · non-contrast
Comparison: None.

CLINICAL DATA: Anterior left shoulder pain radiating into the upper
arm for 1 month. Intermittent limited range of motion.

EXAM:
MRI OF THE LEFT SHOULDER WITHOUT CONTRAST
TECHNIQUE: Multiplanar, multisequence MR imaging of the shoulder was performed.
No intravenous contrast was administered.

[Series 5: T2 fat-sat · axial · left · 4.0mm · 0.55mm/px · z∈[-30,+90]mm · 8 of 26 slices shown (1 of 3)]
[im 1/26]
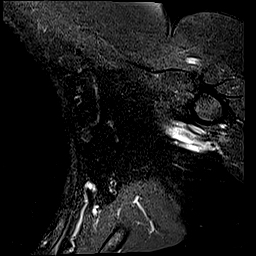
[im 4/26]
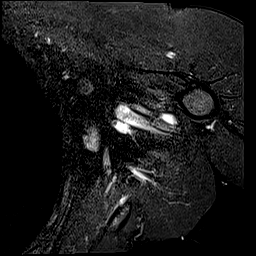
[im 8/26]
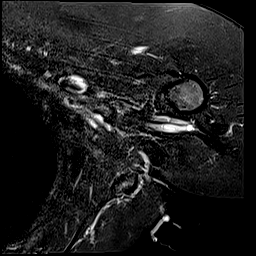
[im 11/26]
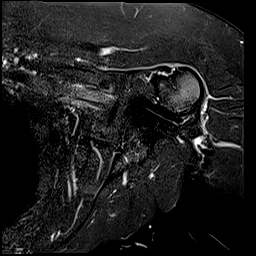
[im 15/26]
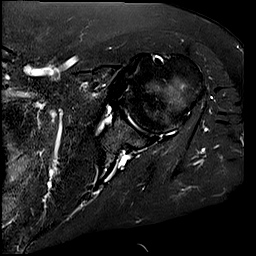
[im 18/26]
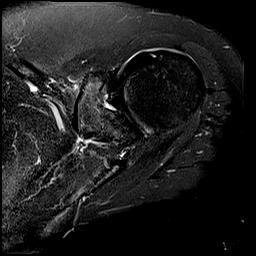
[im 22/26]
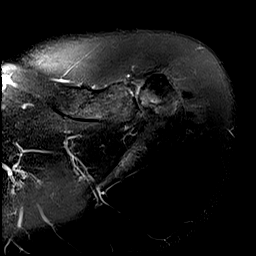
[im 26/26]
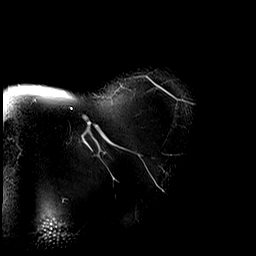

[Series 6: PD · oblique · left · 4.0mm · 0.44mm/px · 9 of 26 slices shown]
[im 1/26]
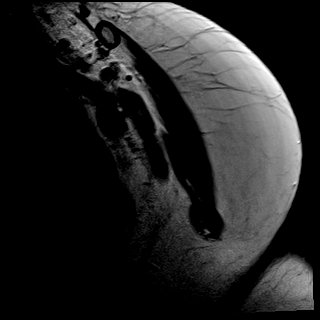
[im 4/26]
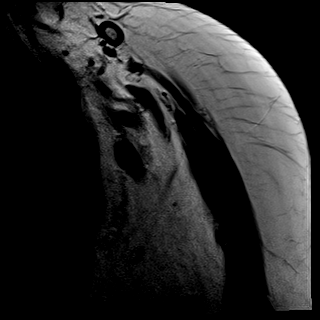
[im 7/26]
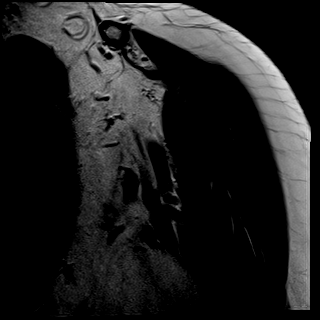
[im 10/26]
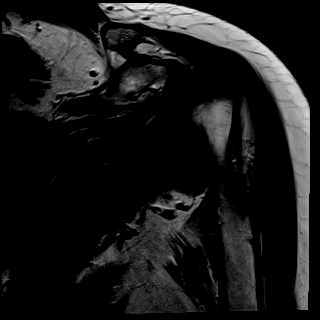
[im 13/26]
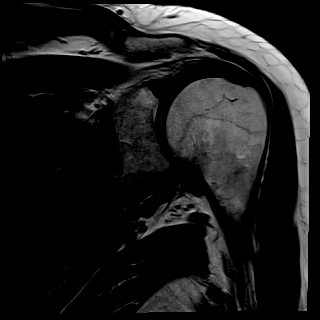
[im 16/26]
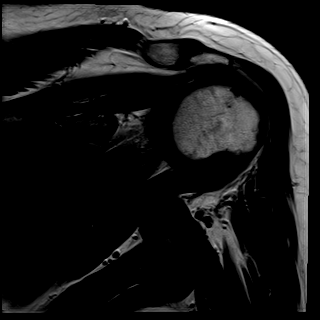
[im 19/26]
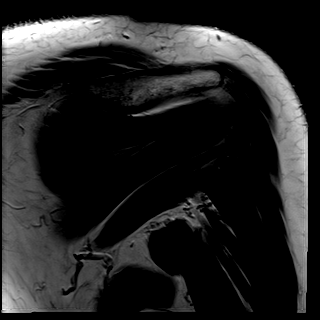
[im 22/26]
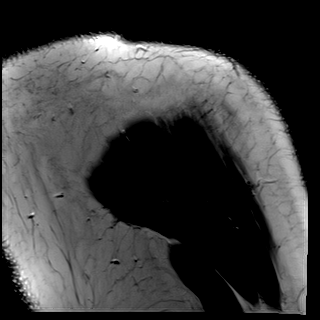
[im 26/26]
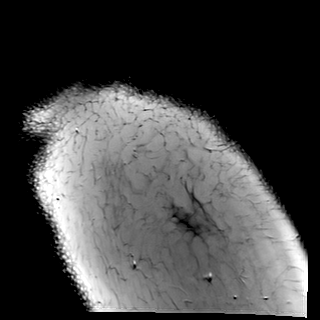

[Series 7: T2 fat-sat · oblique · left · 4.0mm · 0.44mm/px · 9 of 26 slices shown (2 of 3)]
[im 1/26]
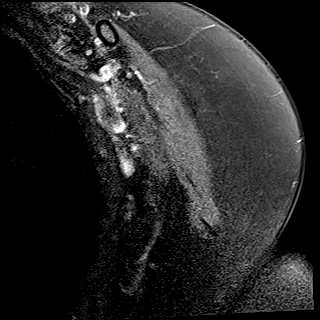
[im 4/26]
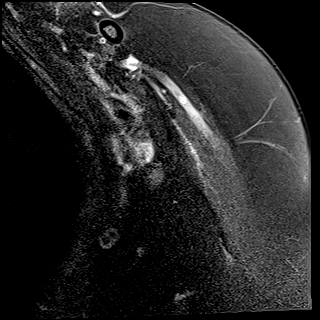
[im 7/26]
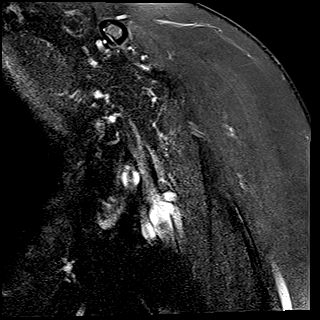
[im 10/26]
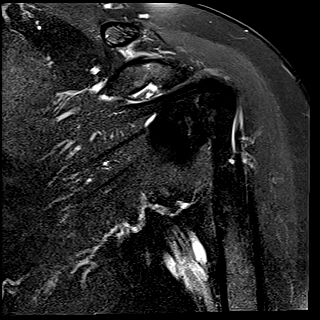
[im 13/26]
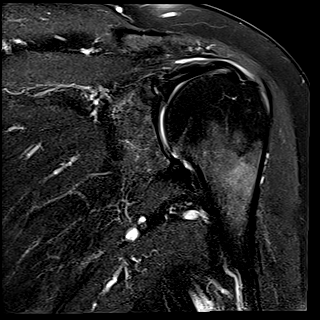
[im 16/26]
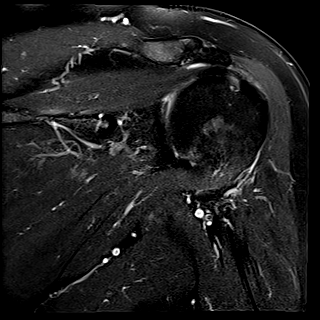
[im 19/26]
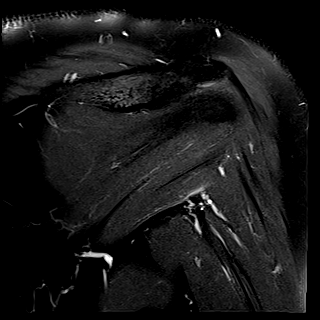
[im 22/26]
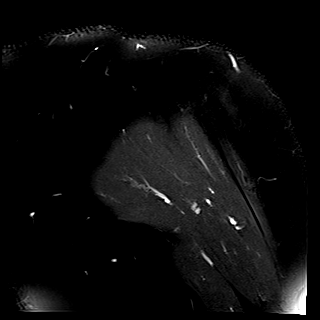
[im 26/26]
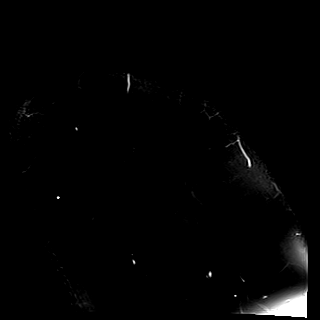

[Series 8: T2 fat-sat · oblique · left · 4.0mm · 0.23mm/px · 7 of 22 slices shown (3 of 3)]
[im 1/22]
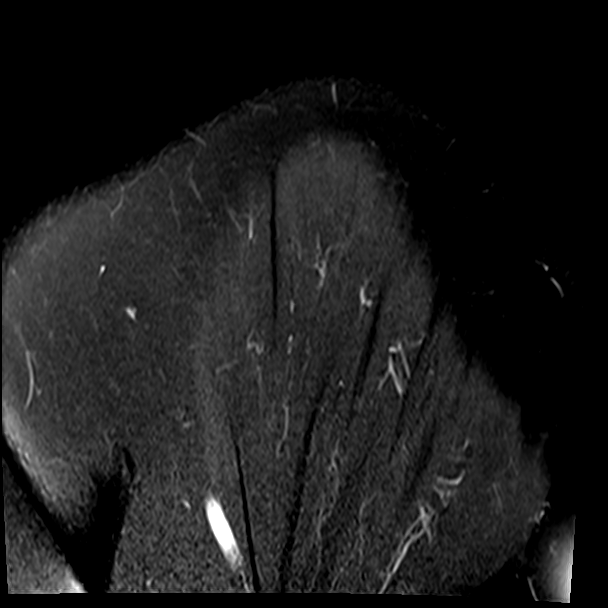
[im 4/22]
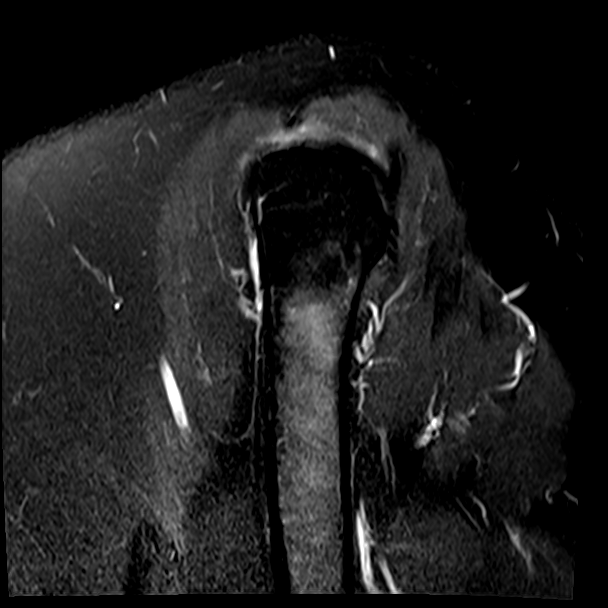
[im 8/22]
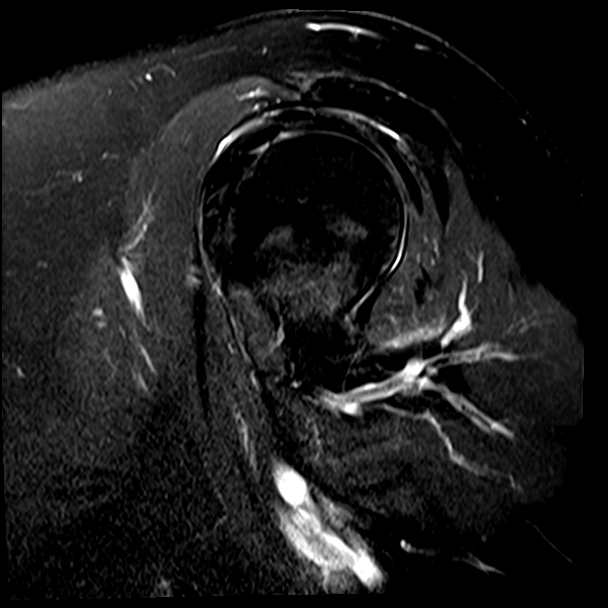
[im 11/22]
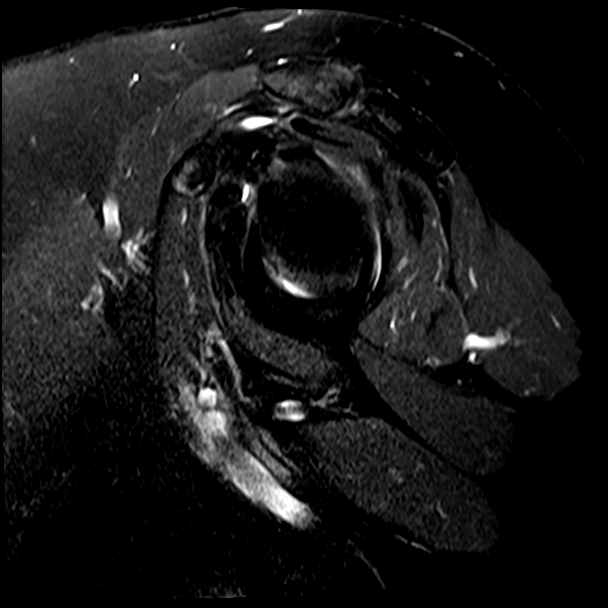
[im 15/22]
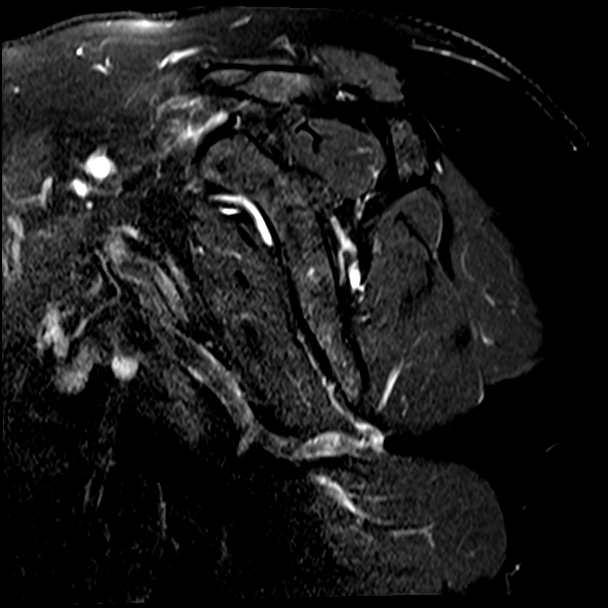
[im 18/22]
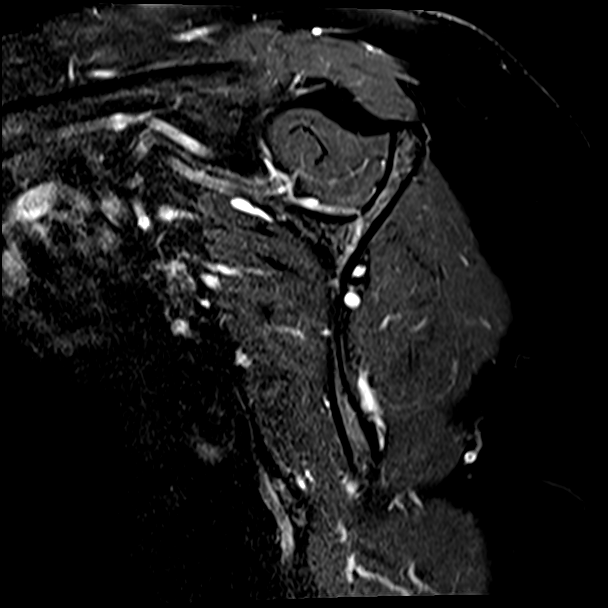
[im 22/22]
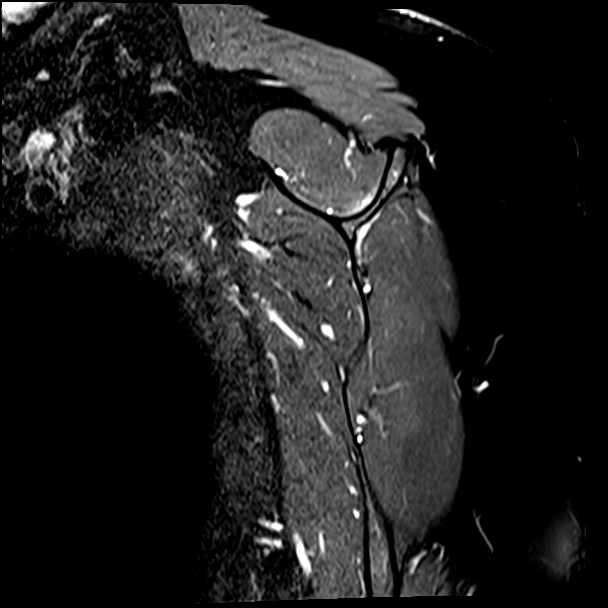

[33 of 40 positions shown; findings below may reference images not displayed]

FINDINGS: Rotator cuff: Intact with mild, heterogeneously increased T2 signal
in the supraspinatus and infraspinatus tendons.

Muscles:  Normal without atrophy or focal lesion.

Biceps long head:  Intact.

Acromioclavicular Joint: Mild osteoarthritis. Type 2 acromion. There
is a small volume of fluid in the subacromial/subdeltoid bursa.

Glenohumeral Joint: Appears normal.

Labrum:  Intact.

Bones:  No fracture or worrisome lesion.

Other: None.
IMPRESSION: Mild supraspinatus and infraspinatus tendinopathy without tear.

Mild acromioclavicular osteoarthritis.

Small volume of subacromial/subdeltoid fluid compatible with
bursitis.

## 2022-09-06 ENCOUNTER — Ambulatory Visit: Payer: Medicare HMO | Admitting: Family Medicine

## 2022-09-20 DIAGNOSIS — G4733 Obstructive sleep apnea (adult) (pediatric): Secondary | ICD-10-CM | POA: Diagnosis not present

## 2022-09-21 ENCOUNTER — Ambulatory Visit: Payer: Medicare HMO | Admitting: Family Medicine

## 2022-09-24 ENCOUNTER — Other Ambulatory Visit: Payer: Self-pay | Admitting: Family Medicine

## 2022-09-24 DIAGNOSIS — I10 Essential (primary) hypertension: Secondary | ICD-10-CM

## 2022-09-25 NOTE — Telephone Encounter (Signed)
Requested Prescriptions  Pending Prescriptions Disp Refills   amLODipine (NORVASC) 2.5 MG tablet [Pharmacy Med Name: AMLODIPINE BESYLATE 2.5 MG TAB] 90 tablet 0    Sig: TAKE 1 TABLET BY MOUTH EVERY DAY     Cardiovascular: Calcium Channel Blockers 2 Failed - 09/24/2022  8:34 AM      Failed - Valid encounter within last 6 months    Recent Outpatient Visits           6 months ago Annual physical exam   [place] [facility] Smitty Cords, DO   9 months ago Persistent atrial fibrillation [facility])   [place] [facility] Smitty Cords, DO   1 year ago Essential hypertension   [place] [facility] Smitty Cords, DO   1 year ago Annual physical exam   [place] [facility] Smitty Cords, DO       Future Appointments             In 1 week Althea Charon Netta Neat, DO [place] [facility]            Passed - Last BP in normal range    BP Readings from Last 1 Encounters:  03/30/22 139/84         Passed - Last Heart Rate in normal range    Pulse Readings from Last 1 Encounters:  03/30/22 61

## 2022-10-02 ENCOUNTER — Ambulatory Visit (INDEPENDENT_AMBULATORY_CARE_PROVIDER_SITE_OTHER): Payer: Medicare HMO | Admitting: Family Medicine

## 2022-10-02 ENCOUNTER — Other Ambulatory Visit: Payer: Self-pay | Admitting: Family Medicine

## 2022-10-02 ENCOUNTER — Encounter: Payer: Self-pay | Admitting: Family Medicine

## 2022-10-02 VITALS — BP 130/68 | HR 62 | Resp 18 | Ht 64.0 in | Wt 167.0 lb

## 2022-10-02 DIAGNOSIS — I10 Essential (primary) hypertension: Secondary | ICD-10-CM

## 2022-10-02 DIAGNOSIS — G4733 Obstructive sleep apnea (adult) (pediatric): Secondary | ICD-10-CM

## 2022-10-02 DIAGNOSIS — Z1211 Encounter for screening for malignant neoplasm of colon: Secondary | ICD-10-CM

## 2022-10-02 DIAGNOSIS — I4819 Other persistent atrial fibrillation: Secondary | ICD-10-CM | POA: Diagnosis not present

## 2022-10-02 DIAGNOSIS — R7309 Other abnormal glucose: Secondary | ICD-10-CM

## 2022-10-02 DIAGNOSIS — Z0000 Encounter for general adult medical examination without abnormal findings: Secondary | ICD-10-CM

## 2022-10-02 DIAGNOSIS — Z1231 Encounter for screening mammogram for malignant neoplasm of breast: Secondary | ICD-10-CM | POA: Diagnosis not present

## 2022-10-02 NOTE — Assessment & Plan Note (Signed)
Controlled BP - Home BP readings reviewed  No known complications  Off Amlodipine 2.5mg  no longer needed   Plan:  1. Off medication 2. Encourage improved lifestyle - low sodium diet, regular exercise 3. Continue monitor BP outside office, bring readings to next visit, if persistently >140/90 or new symptoms notify office sooner

## 2022-10-02 NOTE — Progress Notes (Signed)
Subjective:    Patient ID: Rachel Whitehead, female    DOB: [birth year], 67 y.o.   MRN: [id number]  Rachel Whitehead is a 67 y.o. female presenting on 10/02/2022 for Hypertension   HPI   Persistent Atrial Fibrillation Followed by [facility] Cardiology Dr Darrold Junker and EP Dr Gerre Pebbles Cardioversion x 2 previously In Spring 2024, has had Cardiac Ablation and repeat procedure Last Cardioversion per Tamsen Snider 08/10/22, completed. She was in [place] for period of time. Had x 2 episodes, and took Flecainide to resolve. Currently doing well in rhythm Continues on Xarelto 20mg  daily, she is interested to inquire about possibility of coming off of this.  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 Not wearing every night. Has in house sleep study in December. Her pressure number is 8 mmHg Has seen Pulmonologist, they adjusted pressure settings.  CHRONIC HTN: She has done well with improved BP Current Meds - None - off of Amlodipine 2.5 Reports good compliance, took meds today. Tolerating well, w/o complaints. Lifestyle: - Diet: balanced, she does a cleanse - Exercise: walking, gardening, active. Denies CP, dyspnea, HA, edema, dizziness / lightheadedness   Supplement list PlasmaFlo StemEnhance Ultra Stem Cell Support Cyactiv - Inflammation Defend - Dietary Supplement - Immune Support Z-Stack - Dr Annye Asa Vitamin 0 Zinc, Quercetin, Vitamin C, Vitamin D  Bio SYNC - Gundry MD - PhytoSure, BioMax  Blood Pressure Optimizer - Potassium  Energize - Dietary Vegetarian supplement - AS NEEDED      Health Maintenance:   Breast CA Screening: Due for mammogram screening. Last mammogram result >2+ years ago reported normal. No prior history abnormal mammogram. No known family history of breast cancer. Currently asymptomatic. - Ready for new mammogram order   Colon CA Screening: Never had colonoscopy. Currently asymptomatic. No known family history of colon CA  She is interested in Cologuard, ready  to order today     10/02/2022    3:07 PM 03/07/2022   10:23 AM 11/30/2021    1:59 PM  Depression screen PHQ 2/9  Decreased Interest 0 0 0  Down, Depressed, Hopeless 0 0 0  PHQ - 2 Score 0 0 0  Altered sleeping 0 1 1  Tired, decreased energy 1 1 1   Change in appetite 0 0 0  Feeling bad or failure about yourself  0 0 0  Trouble concentrating 0 1 1  Moving slowly or fidgety/restless 0 0 0  Suicidal thoughts 0 0 0  PHQ-9 Score 1 3 3   Difficult doing work/chores Not difficult at all Not difficult at all Somewhat difficult    Social History   Tobacco Use   Smoking status: Never  Vaping Use   Vaping Use: Never used  Substance Use Topics   Alcohol use: Yes    Alcohol/week: 4.0 standard drinks of alcohol    Types: 4 Standard drinks or equivalent per week   Drug use: Never    Review of Systems Per HPI unless specifically indicated above     Objective:    BP 130/68 (BP Location: Right Arm, Patient Position: Sitting, Cuff Size: Normal)   Pulse 62   Resp 18   Ht 5\' 4"  (1.626 m)   Wt 167 lb (75.8 kg)   SpO2 96%   BMI 28.67 kg/m   Wt Readings from Last 3 Encounters:  10/02/22 167 lb (75.8 kg)  03/30/22 168 lb (76.2 kg)  03/07/22 173 lb (78.5 kg)    Physical Exam Vitals and nursing note reviewed.  Constitutional:      General: She is not in acute distress.    Appearance: Normal appearance. She is well-developed. She is not diaphoretic.     Comments: Well-appearing, comfortable, cooperative  HENT:     Head: Normocephalic and atraumatic.  Eyes:     General:        Right eye: No discharge.        Left eye: No discharge.     Conjunctiva/sclera: Conjunctivae normal.  Cardiovascular:     Rate and Rhythm: Normal rate and regular rhythm.     Pulses: Normal pulses.     Heart sounds: No murmur heard. Pulmonary:     Effort: Pulmonary effort is normal.  Skin:    General: Skin is warm and dry.     Findings: No erythema or rash.  Neurological:     Mental Status: She is  alert and oriented to person, place, and time.  Psychiatric:        Mood and Affect: Mood normal.        Behavior: Behavior normal.        Thought Content: Thought content normal.     Comments: Well groomed, good eye contact, normal speech and thoughts      Results for orders placed or performed in visit on 03/07/22  Vitamin B12  Result Value Ref Range   Vitamin B-12 402 200 - 1,100 pg/mL  VITAMIN D 25 Hydroxy (Vit-D Deficiency, Fractures)  Result Value Ref Range   Vit D, 25-Hydroxy 43 30 - 100 ng/mL  TSH  Result Value Ref Range   TSH 2.44 0.40 - 4.50 mIU/L  Lipid panel  Result Value Ref Range   Cholesterol 185 <200 mg/dL   HDL 66 > OR = 50 mg/dL   Triglycerides 78 <161 mg/dL   LDL Cholesterol (Calc) 102 (H) mg/dL (calc)   Total CHOL/HDL Ratio 2.8 <5.0 (calc)   Non-HDL Cholesterol (Calc) 119 <130 mg/dL (calc)  CBC with Differential/Platelet  Result Value Ref Range   WBC 5.5 3.8 - 10.8 Thousand/uL   RBC 4.40 3.80 - 5.10 Million/uL   Hemoglobin 13.7 11.7 - 15.5 g/dL   HCT 09.6 04.5 - 40.9 %   MCV 92.0 80.0 - 100.0 fL   MCH 31.1 27.0 - 33.0 pg   MCHC 33.8 32.0 - 36.0 g/dL   RDW 81.1 91.4 - 78.2 %   Platelets 286 140 - 400 Thousand/uL   MPV 10.8 7.5 - 12.5 fL   Neutro Abs 3,284 1,500 - 7,800 cells/uL   Lymphs Abs 1,260 850 - 3,900 cells/uL   Absolute Monocytes 385 200 - 950 cells/uL   Eosinophils Absolute 545 (H) 15 - 500 cells/uL   Basophils Absolute 28 0 - 200 cells/uL   Neutrophils Relative % 59.7 %   Total Lymphocyte 22.9 %   Monocytes Relative 7.0 %   Eosinophils Relative 9.9 %   Basophils Relative 0.5 %  COMPLETE METABOLIC PANEL WITH GFR  Result Value Ref Range   Glucose, Bld 84 65 - 99 mg/dL   BUN 15 7 - 25 mg/dL   Creat 9.56 2.13 - 0.86 mg/dL   eGFR 578 > OR = 60 IO/NGE/9.52W4   BUN/Creatinine Ratio SEE NOTE: 6 - 22 (calc)   Sodium 141 135 - 146 mmol/L   Potassium 4.6 3.5 - 5.3 mmol/L   Chloride 106 98 - 110 mmol/L   CO2 27 20 - 32 mmol/L   Calcium 9.4  8.6 - 10.4 mg/dL   Total Protein  7.2 6.1 - 8.1 g/dL   Albumin 4.4 3.6 - 5.1 g/dL   Globulin 2.8 1.9 - 3.7 g/dL (calc)   AG Ratio 1.6 1.0 - 2.5 (calc)   Total Bilirubin 0.6 0.2 - 1.2 mg/dL   Alkaline phosphatase (APISO) 81 37 - 153 U/L   AST 32 10 - 35 U/L   ALT 37 (H) 6 - 29 U/L      Assessment & Plan:   Problem List Items Addressed This Visit     Essential hypertension    Controlled BP - Home BP readings reviewed  No known complications  Off Amlodipine 2.5mg  no longer needed   Plan:  1. Off medication 2. Encourage improved lifestyle - low sodium diet, regular exercise 3. Continue monitor BP outside office, bring readings to next visit, if persistently >140/90 or new symptoms notify office sooner      OSA on CPAP    Well controlled, chronic OSA on CPAP - Good adherence to CPAP nightly - Continue current CPAP therapy, patient seems to be benefiting from therapy  Issue with sinus drainage while using CPAP, she will discuss with Pulmonology and follow up on CPAP       Persistent atrial fibrillation (HCC) - Primary    Followed by [facility] Cardiology and EP New onset earlier in 2023 provoked by illness S/p multiple Cardioversion and Cardiac Ablation procedures Currently in normal sinus rhythm, not in AFib She is off Flecainide, has it as AS NEEDED right now On Xarelto for anticoagulation      Other Visit Diagnoses     Colon cancer screening       Relevant Orders   Cologuard   Encounter for screening mammogram for malignant neoplasm of breast       Relevant Orders   MM 3D SCREENING MAMMOGRAM BILATERAL BREAST       No orders of the defined types were placed in this encounter.  Orders Placed This Encounter  Procedures   MM 3D SCREENING MAMMOGRAM BILATERAL BREAST    Standing Status:   Future    Standing Expiration Date:   10/02/2023    Order Specific Question:   Reason for Exam (SYMPTOM  OR DIAGNOSIS REQUIRED)    Answer:   Screening bilateral 3D Mammogram Tomo     Order Specific Question:   Preferred imaging location?    Answer:   [facility]   Cologuard      Follow up plan: Return in about 5 months (around 03/04/2023) for 5 month fasting lab only then 1 week later Annual Physical.  Future labs ordered for 02/2023   Saralyn Pilar, DO [facility] 10/02/2022, 3:20 PM

## 2022-10-02 NOTE — Patient Instructions (Addendum)
Thank you for coming to the office today.  Keep up with Cardiology as planned  Remain off of blood pressure pill Amlodipine.  Discuss the Xarelto with Cardiology, possibility of coming off in the future.  -----  Follow up with Pulmonology on CPAP.  ---------------  For Mammogram screening for breast cancer   Call the Imaging Center below anytime to schedule your own appointment now that order has been placed.  [facility] at [facility] [address] [place], [place] [address] Phone: [phone]  --------------------------------------------------------------------  Ordered the Cologuard (home kit) test for colon cancer screening. Stay tuned for further updates.  It will be shipped to you directly. If not received in 2-4 weeks, call us or the company.   If you send it back and no results are received in 2-4 weeks, call us or the company as well!   Colon Cancer Screening: - For all adults age 15+ routine colon cancer screening is highly recommended.     - Recent guidelines from American Cancer Society recommend starting age of 10 - Early detection of colon cancer is important, because often there are no warning signs or symptoms, also if found early usually it can be cured. Late stage is hard to treat.   - If Cologuard is NEGATIVE, then it is good for 3 years before next due - If Cologuard is POSITIVE, then it is strongly advised to get a Colonoscopy, which allows the GI doctor to locate the source of the cancer or polyp (even very early stage) and treat it by removing it. ------------------------- Follow instructions to collect sample, you may call the company for any help or questions, 24/7 telephone support at [phone].   Please schedule a Follow-up Appointment to: Return in about 5 months (around 03/04/2023) for 5 month fasting lab only then 1 week later Annual Physical.  If you have any other questions  or concerns, please feel free to call the office or send a message through MyChart. You may also schedule an earlier appointment if necessary.  Additionally, you may be receiving a survey about your experience at our office within a few days to 1 week by e-mail or mail. We value your feedback.  Saralyn Pilar, DO [facility], [place]

## 2022-10-02 NOTE — Assessment & Plan Note (Signed)
Well controlled, chronic OSA on CPAP - Good adherence to CPAP nightly - Continue current CPAP therapy, patient seems to be benefiting from therapy  Issue with sinus drainage while using CPAP, she will discuss with Pulmonology and follow up on CPAP

## 2022-10-02 NOTE — Assessment & Plan Note (Signed)
Followed by [facility] Cardiology and EP New onset earlier in 2023 provoked by illness S/p multiple Cardioversion and Cardiac Ablation procedures Currently in normal sinus rhythm, not in AFib She is off Flecainide, has it as AS NEEDED right now On Xarelto for anticoagulation

## 2022-10-12 DIAGNOSIS — I10 Essential (primary) hypertension: Secondary | ICD-10-CM | POA: Diagnosis not present

## 2022-10-12 DIAGNOSIS — I160 Hypertensive urgency: Secondary | ICD-10-CM | POA: Diagnosis not present

## 2022-10-12 DIAGNOSIS — I4819 Other persistent atrial fibrillation: Secondary | ICD-10-CM | POA: Diagnosis not present

## 2022-10-12 DIAGNOSIS — I4891 Unspecified atrial fibrillation: Secondary | ICD-10-CM | POA: Diagnosis not present

## 2022-10-16 DIAGNOSIS — Z7901 Long term (current) use of anticoagulants: Secondary | ICD-10-CM | POA: Diagnosis not present

## 2022-10-16 DIAGNOSIS — I10 Essential (primary) hypertension: Secondary | ICD-10-CM | POA: Diagnosis not present

## 2022-10-16 DIAGNOSIS — I4891 Unspecified atrial fibrillation: Secondary | ICD-10-CM | POA: Diagnosis not present

## 2022-10-16 DIAGNOSIS — I4819 Other persistent atrial fibrillation: Secondary | ICD-10-CM | POA: Diagnosis not present

## 2022-10-16 DIAGNOSIS — Z01818 Encounter for other preprocedural examination: Secondary | ICD-10-CM | POA: Diagnosis not present

## 2022-10-19 ENCOUNTER — Encounter
Admission: RE | Disposition: A | Discharge status: Home / Self Care | Payer: Self-pay | Source: Home / Self Care | Attending: Internal Medicine

## 2022-10-19 ENCOUNTER — Other Ambulatory Visit: Payer: Self-pay

## 2022-10-19 ENCOUNTER — Encounter: Payer: Self-pay | Admitting: Internal Medicine

## 2022-10-19 ENCOUNTER — Ambulatory Visit
Admission: RE | Admit: 2022-10-19 | Discharge: 2022-10-19 | Disposition: A | Discharge status: Home / Self Care | Payer: Medicare HMO | Attending: Internal Medicine | Admitting: Internal Medicine

## 2022-10-19 ENCOUNTER — Ambulatory Visit: Payer: Medicare HMO | Admitting: Anesthesiology

## 2022-10-19 DIAGNOSIS — I10 Essential (primary) hypertension: Secondary | ICD-10-CM | POA: Diagnosis not present

## 2022-10-19 DIAGNOSIS — I4819 Other persistent atrial fibrillation: Secondary | ICD-10-CM | POA: Diagnosis not present

## 2022-10-19 DIAGNOSIS — R0789 Other chest pain: Secondary | ICD-10-CM | POA: Insufficient documentation

## 2022-10-19 DIAGNOSIS — Z7901 Long term (current) use of anticoagulants: Secondary | ICD-10-CM | POA: Insufficient documentation

## 2022-10-19 DIAGNOSIS — Z79899 Other long term (current) drug therapy: Secondary | ICD-10-CM | POA: Insufficient documentation

## 2022-10-19 DIAGNOSIS — I4892 Unspecified atrial flutter: Secondary | ICD-10-CM | POA: Diagnosis not present

## 2022-10-19 DIAGNOSIS — I4891 Unspecified atrial fibrillation: Secondary | ICD-10-CM | POA: Diagnosis not present

## 2022-10-19 DIAGNOSIS — G4730 Sleep apnea, unspecified: Secondary | ICD-10-CM | POA: Insufficient documentation

## 2022-10-19 HISTORY — PX: CARDIOVERSION: SHX1299

## 2022-10-19 SURGERY — CARDIOVERSION
Anesthesia: General

## 2022-10-19 MED ORDER — PROPOFOL 10 MG/ML IV BOLUS
INTRAVENOUS | Status: DC | PRN
Start: 2022-10-19 — End: 2022-10-19
  Administered 2022-10-19: 20 mg via INTRAVENOUS
  Administered 2022-10-19: 50 mg via INTRAVENOUS

## 2022-10-19 MED ORDER — SODIUM CHLORIDE 0.9 % IV SOLN: INTRAVENOUS | Status: DC

## 2022-10-19 MED ORDER — PROPOFOL 10 MG/ML IV BOLUS
INTRAVENOUS | Status: AC
  Filled 2022-10-19: qty 60

## 2022-10-19 NOTE — H&P (Signed)
Established Patient Visit   Chief Complaint: Chief Complaint  Patient presents with   Atrial Fibrillation    Patient stated symptoms began Friday morning    Chest Pressure    Patient stated just a little bit this weekend    Dizziness    Frequently today   Date of Service: 10/16/2022 Date of Birth: [birth year] PCP: Medical, [address]  History of Present Illness: Ms. Renstrom is a 67 y.o.female patient who returns for evaluation of atrial fibrillation and essential hypertension.  The patient was vacationing in [place] where she was seen at a local emergency room on 10/31/2021 with pneumonia and atrial fibrillation of unknown duration.  After discharge, patient was started on Xarelto for stroke prevention.  She is now referred for further evaluation.  She denies exertional chest pain or shortness of breath.  She denies palpitations or heart racing.  She denies peripheral edema.  The patient is active but does not exercise regularly.  ECG 12/08/2021 revealed atrial fibrillation at a rate of 73 bpm.  Patient is minimally symptomatic.  TSH normal, 2.44 on 03/07/2022.  72-hour Holter monitor 12/08/2021 - 12/11/2021 revealed predominant atrial fibrillation with mean heart rate of 74 bpm, heart rate range 35 to 155 bpm, with rare premature ventricular contractions.  2D echocardiogram 12/23/2021 revealed normal left ventricular function, with LVEF grain 55%, with mild mitral and tricuspid regurgitation.  The patient underwent successful cardioversion on 02/28/2022 on flecainide 50 mg twice daily.  She was seen in follow-up 03/07/2022 at which time she was noted to be back in atrial fibrillation.  Flecainide was increased to 100 mg twice daily and the patient underwent repeat cardioversion 03/30/2022 and successively converted to sinus rhythm.    The patient underwent successful atrial fibrillation ablation 05/31/2022 by Dr. Gerre Pebbles at [facility].  Recurrent atrial fibrillation and underwent cardioversion  06/16/2022.  The patient returns today due to recurrent afib and feeling dizzy lately. She was apparently told to stop her Flecainide 2 months after her ablation which she did but then went back into afib. She restarted her Flecainide on Friday and would like to proceed with cardioversion as she is quite symptomatic when she is in afib. Patient denies chest pain, shortness of breath, palpitations, diaphoresis, syncope, edema, PND, orthopnea.   Past Medical and Surgical History  Past Medical History Past Medical History:  Diagnosis Date   Amblyopia    BRAO (branch retinal artery occlusion), left    Hypertension    Ischemic optic neuropathy, left     Past Surgical History She has a past surgical history that includes strabismus eye surgery and cardioversion external (N/A).   Medications and Allergies  Current Medications  Current Outpatient Medications  Medication Sig Dispense Refill   flecainide (TAMBOCOR) 100 MG tablet Take 1 tablet (100 mg total) by mouth 2 (two) times daily 60 tablet 11   MULTIVITAMIN ORAL Take by mouth once daily     rivaroxaban (XARELTO) 20 mg tablet Take 1 tablet (20 mg total) by mouth daily with dinner 90 tablet 1   amLODIPine (NORVASC) 2.5 MG tablet Take by mouth (Patient not taking: Reported on 10/12/2022)     omeprazole (PRILOSEC) 20 MG DR capsule Take 1 capsule (20 mg total) by mouth 2 (two) times daily before meals (Patient not taking: Reported on 10/16/2022) 60 capsule 0   TORsemide (DEMADEX) 20 MG tablet Take 20 mg by mouth once daily (Patient not taking: Reported on 10/16/2022)     No current facility-administered medications  for this visit.    Allergies: Patient has no known allergies.  Social and Family History  Social History  reports that she has never smoked. She has never used smokeless tobacco. She reports current alcohol use of about 1.0 standard drink of alcohol per week. She reports that she does not use drugs.  Family History Family  History  Problem Relation Name Age of Onset   High blood pressure (Hypertension) Mother     Glaucoma Neg Hx     Macular degeneration Neg Hx     Anesthesia problems Neg Hx      Review of Systems   Review of Systems: The patient denies chest pain, shortness of breath, orthopnea, paroxysmal nocturnal dyspnea, pedal edema, palpitations, heart racing, presyncope, syncope. Review of 12 Systems is negative except as described above.  Physical Examination   Vitals:BP (!) 120/90 (BP Location: Left upper arm, Patient Position: Sitting, BP Cuff Size: Adult)   Pulse 81   Resp 15   Ht 162.6 cm (5\' 4" )   Wt 71.8 kg (158 lb 3.2 oz)   SpO2 97%   BMI 27.15 kg/m  Ht:162.6 cm (5\' 4" ) Wt:71.8 kg (158 lb 3.2 oz) WUJ:WJXB surface area is 1.8 meters squared. Body mass index is 27.15 kg/m.  General: Alert and oriented. Well-appearing. No acute distress. HEENT: Pupils equally reactive to light and accomodation    Neck: no JVD Lungs: Normal effort of breathing; clear to auscultation bilaterally; no wheezes, rales, rhonchi Heart: irr irregular.. No murmur, rub, or gallop Abdomen: nondistended Extremities: no cyanosis, clubbing, or edema Peripheral Pulses: 2+ radial Skin: Warm, dry, no diaphoresis   Assessment   67 y.o. female with  1. Atrial fibrillation status post cardioversion (CMS/HHS-HCC)   2. Persistent atrial fibrillation (CMS/HHS-HCC)   3. Essential hypertension   4. [place] term (current) use of anticoagulants    67 year old female with recent diagnosis of atrial fibrillation while vacationing in [place] in the setting of pneumonia on Xarelto for stroke prevention, remains in atrial fibrillation, asymptomatic.  72-hour Holter monitor 12/08/2021 - 12/11/2021 revealed predominant atrial fibrillation with mean heart rate of 74 bpm.  2D echocardiogram 12/23/2021 revealed LVEF greater than 55%.  Patient has essential hypertension, blood pressure well controlled on low-dose amlodipine.  She  underwent cardioversion on 02/28/2022 while on flecainide 50 mg BID, which was initially successful, and she reported feeling better. ECG 03/07/2022 revealed atrial fibrillation at slow ventricular rate.  Flecainide was uptitrated to 100 mg twice daily the patient underwent repeat successful electrical cardioversion 03/30/2022.  The patient underwent successful AF ablation 05/31/2022 with recurrence requiring cardioversion 06/16/2022.  ECG today revealed sinus bradycardia with first-degree AV block at 55 bpm.   10/16/2022 EKG: atrial flutter, rate 86 bpm, with variable AV block   Plan    1.  Continue current medications 2.  Counseled patient about low-sodium diet 3.  Blood pressure is well controlled 4.  Continue Xarelto for stroke prevention 5.  Follow-up with Dr. Gerre Pebbles for possible repeat ablation 6.  Continue Flecainide 100 mg BID. Schedule for Direct current cardioversion. I have discussed regarding risks benefits rate control vs rhythm control with the patient. Patient understands cardiac arrest and need for CPR, aspiration pneumonia, but not limited to these. Patient is willing.     Plan     Orders Placed This Encounter  Procedures   ECG 12-lead    Return in about 3 months (around 01/16/2023).   Louis Ivery, DO

## 2022-10-19 NOTE — Anesthesia Postprocedure Evaluation (Signed)
Anesthesia Post Note  Patient: Rachel Whitehead  Procedure(s) Performed: CARDIOVERSION  Patient location during evaluation: Specials Recovery Anesthesia Type: General Level of consciousness: awake and alert Pain management: pain level controlled Vital Signs Assessment: post-procedure vital signs reviewed and stable Respiratory status: spontaneous breathing, nonlabored ventilation, respiratory function stable and patient connected to nasal cannula oxygen Cardiovascular status: blood pressure returned to baseline and stable Postop Assessment: no apparent nausea or vomiting Anesthetic complications: no   No notable events documented.   Last Vitals:  Vitals:   10/19/22 1130 10/19/22 1145  BP: 129/82 (!) 147/94  Pulse: (!) 59 (!) 57  Resp: 13 19  Temp:    SpO2: 95% 98%    Last Pain:  Vitals:   10/19/22 1145  TempSrc:   PainSc: 0-No pain                 Louie Boston

## 2022-10-19 NOTE — Transfer of Care (Signed)
Immediate Anesthesia Transfer of Care Note  Patient: Rachel Whitehead  Procedure(s) Performed: CARDIOVERSION  Patient Location:  spu  Anesthesia Type:General  Level of Consciousness: awake  Airway & Oxygen Therapy: Patient Spontanous Breathing and Patient connected to nasal cannula oxygen  Post-op Assessment: Report given to RN and Post -op Vital signs reviewed and stable  Post vital signs: Reviewed  Last Vitals:  Vitals Value Taken Time  BP 156/95 10/19/22 1120  Temp    Pulse 60 10/19/22 1123  Resp 14 10/19/22 1123  SpO2 96 % 10/19/22 1123    Last Pain:  Vitals:   10/19/22 1039  TempSrc: Oral  PainSc: 0-No pain         Complications: No notable events documented.

## 2022-10-19 NOTE — Anesthesia Preprocedure Evaluation (Addendum)
Anesthesia Evaluation  Patient identified by MRN, date of birth, ID band Patient awake    Reviewed: Allergy & Precautions, NPO status , Patient's Chart, lab work & pertinent test results  History of Anesthesia Complications Negative for: history of anesthetic complications  Airway Mallampati: II  TM Distance: >3 FB Neck ROM: full    Dental no notable dental hx.    Pulmonary sleep apnea and Continuous Positive Airway Pressure Ventilation    Pulmonary exam normal        Cardiovascular hypertension, On Medications + dysrhythmias Atrial Fibrillation  Rhythm:Irregular Rate:Normal     Neuro/Psych negative neurological ROS  negative psych ROS   GI/Hepatic negative GI ROS, Neg liver ROS,,,  Endo/Other  negative endocrine ROS    Renal/GU negative Renal ROS  negative genitourinary   Musculoskeletal negative musculoskeletal ROS (+)    Abdominal   Peds  Hematology negative hematology ROS (+)   Anesthesia Other Findings Past Medical History: No date: Hypertension No date: Urinary incontinence  Past Surgical History: 02/14/2022: CARDIOVERSION; N/A     Comment:  Procedure: CARDIOVERSION;  Surgeon: Marcina Millard, MD;  Location: [facility] ORS;  Service:               Cardiovascular;  Laterality: N/A; 02/28/2022: CARDIOVERSION; N/A     Comment:  Procedure: CARDIOVERSION;  Surgeon: Marcina Millard, MD;  Location: [facility] ORS;  Service:               Cardiovascular;  Laterality: N/A; 03/30/2022: CARDIOVERSION; N/A     Comment:  Procedure: CARDIOVERSION;  Surgeon: Marcina Millard, MD;  Location: [facility] ORS;  Service:               Cardiovascular;  Laterality: N/A; No date: STRABISMUS SURGERY; Left     Comment:  1963  BMI    Body Mass Index: 27.12 kg/m      Reproductive/Obstetrics negative OB ROS                             Anesthesia  Physical Anesthesia Plan  ASA: 3  Anesthesia Plan: General   Post-op Pain Management: Minimal or no pain anticipated   Induction: Intravenous  PONV Risk Score and Plan: Propofol infusion and TIVA  Airway Management Planned: Natural Airway and Nasal Cannula  Additional Equipment:   Intra-op Plan:   Post-operative Plan:   Informed Consent: I have reviewed the patients History and Physical, chart, labs and discussed the procedure including the risks, benefits and alternatives for the proposed anesthesia with the patient or authorized representative who has indicated his/her understanding and acceptance.     Dental Advisory Given  Plan Discussed with: Anesthesiologist, CRNA and Surgeon  Anesthesia Plan Comments: (Patient consented for risks of anesthesia including but not limited to:  - adverse reactions to medications - risk of airway placement if required - damage to eyes, teeth, lips or other oral mucosa - nerve damage due to positioning  - sore throat or hoarseness - Damage to heart, brain, nerves, lungs, other parts of body or loss of life  Patient voiced understanding.)       Anesthesia Quick Evaluation

## 2022-10-19 NOTE — CV Procedure (Signed)
Direct current cardioversion 10/19/2022 12:25 PM  Indication symptomatic A. Fibrillation.  Procedure: Using Propofol for achieving deep sedation, synchronized direct current cardioversion performed. Patient was delivered with 150 Joules of electricity X 1 with success to NSR. Patient tolerated the procedure well. No immediate complication noted.   Patient stable for discharge home.   Rozell Searing Sirron Francesconi, DO 10/19/2022, 12:25 PM

## 2022-10-20 ENCOUNTER — Encounter: Payer: Self-pay | Admitting: Internal Medicine

## 2022-10-20 DIAGNOSIS — G4733 Obstructive sleep apnea (adult) (pediatric): Secondary | ICD-10-CM | POA: Diagnosis not present

## 2022-10-24 DIAGNOSIS — Z1211 Encounter for screening for malignant neoplasm of colon: Secondary | ICD-10-CM | POA: Diagnosis not present

## 2022-10-28 LAB — COLOGUARD: COLOGUARD: NEGATIVE

## 2022-11-18 DIAGNOSIS — G4733 Obstructive sleep apnea (adult) (pediatric): Secondary | ICD-10-CM | POA: Diagnosis not present

## 2022-11-20 DIAGNOSIS — G4733 Obstructive sleep apnea (adult) (pediatric): Secondary | ICD-10-CM | POA: Diagnosis not present

## 2022-12-13 DIAGNOSIS — I4819 Other persistent atrial fibrillation: Secondary | ICD-10-CM | POA: Diagnosis not present

## 2022-12-20 DIAGNOSIS — H34232 Retinal artery branch occlusion, left eye: Secondary | ICD-10-CM | POA: Diagnosis not present

## 2022-12-20 DIAGNOSIS — I4891 Unspecified atrial fibrillation: Secondary | ICD-10-CM | POA: Diagnosis not present

## 2022-12-20 DIAGNOSIS — I38 Endocarditis, valve unspecified: Secondary | ICD-10-CM | POA: Diagnosis not present

## 2022-12-20 DIAGNOSIS — I10 Essential (primary) hypertension: Secondary | ICD-10-CM | POA: Diagnosis not present

## 2022-12-20 DIAGNOSIS — I4819 Other persistent atrial fibrillation: Secondary | ICD-10-CM | POA: Diagnosis not present

## 2022-12-21 DIAGNOSIS — G4733 Obstructive sleep apnea (adult) (pediatric): Secondary | ICD-10-CM | POA: Diagnosis not present

## 2022-12-24 ENCOUNTER — Other Ambulatory Visit: Payer: Self-pay | Admitting: Family Medicine

## 2022-12-24 DIAGNOSIS — I10 Essential (primary) hypertension: Secondary | ICD-10-CM

## 2022-12-26 NOTE — Telephone Encounter (Signed)
Medication no longer listed on current medication list Requested Prescriptions  Pending Prescriptions Disp Refills   amLODipine (NORVASC) 2.5 MG tablet [Pharmacy Med Name: AMLODIPINE BESYLATE 2.5 MG TAB] 90 tablet 0    Sig: TAKE 1 TABLET BY MOUTH EVERY DAY     Cardiovascular: Calcium Channel Blockers 2 Failed - 12/24/2022  8:43 AM      Failed - Last BP in normal range    BP Readings from Last 1 Encounters:  10/19/22 (!) 159/90         Passed - Last Heart Rate in normal range    Pulse Readings from Last 1 Encounters:  10/19/22 (!) 56         Passed - Valid encounter within last 6 months    Recent Outpatient Visits           2 months ago Persistent atrial fibrillation [facility])   [place] [facility], Netta Neat, DO   9 months ago Annual physical exam   [place] [facility] Smitty Cords, DO   1 year ago Persistent atrial fibrillation [facility])   [place] [facility] Smitty Cords, DO   1 year ago Essential hypertension   [place] [facility] Smitty Cords, DO   1 year ago Annual physical exam   [place] [facility] Smitty Cords, DO       Future Appointments             In 2 months Althea Charon, Netta Neat, DO [place] [facility], [facility]

## 2022-12-27 DIAGNOSIS — I517 Cardiomegaly: Secondary | ICD-10-CM | POA: Diagnosis not present

## 2022-12-27 DIAGNOSIS — G4730 Sleep apnea, unspecified: Secondary | ICD-10-CM | POA: Diagnosis not present

## 2022-12-27 DIAGNOSIS — I4891 Unspecified atrial fibrillation: Secondary | ICD-10-CM | POA: Diagnosis not present

## 2022-12-27 DIAGNOSIS — Z7901 Long term (current) use of anticoagulants: Secondary | ICD-10-CM | POA: Diagnosis not present

## 2022-12-27 DIAGNOSIS — I4819 Other persistent atrial fibrillation: Secondary | ICD-10-CM | POA: Diagnosis not present

## 2022-12-27 DIAGNOSIS — Z9889 Other specified postprocedural states: Secondary | ICD-10-CM | POA: Diagnosis not present

## 2022-12-27 DIAGNOSIS — G4733 Obstructive sleep apnea (adult) (pediatric): Secondary | ICD-10-CM | POA: Diagnosis not present

## 2023-01-20 DIAGNOSIS — G4733 Obstructive sleep apnea (adult) (pediatric): Secondary | ICD-10-CM | POA: Diagnosis not present

## 2023-01-26 DIAGNOSIS — I4819 Other persistent atrial fibrillation: Secondary | ICD-10-CM | POA: Diagnosis not present

## 2023-02-15 DIAGNOSIS — I480 Paroxysmal atrial fibrillation: Secondary | ICD-10-CM | POA: Diagnosis not present

## 2023-02-15 DIAGNOSIS — G4730 Sleep apnea, unspecified: Secondary | ICD-10-CM | POA: Diagnosis not present

## 2023-02-15 DIAGNOSIS — I10 Essential (primary) hypertension: Secondary | ICD-10-CM | POA: Diagnosis not present

## 2023-02-19 DIAGNOSIS — G4733 Obstructive sleep apnea (adult) (pediatric): Secondary | ICD-10-CM | POA: Diagnosis not present

## 2023-02-20 DIAGNOSIS — G4733 Obstructive sleep apnea (adult) (pediatric): Secondary | ICD-10-CM | POA: Diagnosis not present

## 2023-02-26 ENCOUNTER — Other Ambulatory Visit: Payer: Medicare HMO

## 2023-02-26 DIAGNOSIS — Z0000 Encounter for general adult medical examination without abnormal findings: Secondary | ICD-10-CM

## 2023-02-26 DIAGNOSIS — I4819 Other persistent atrial fibrillation: Secondary | ICD-10-CM

## 2023-02-26 DIAGNOSIS — I10 Essential (primary) hypertension: Secondary | ICD-10-CM

## 2023-02-26 DIAGNOSIS — R7309 Other abnormal glucose: Secondary | ICD-10-CM

## 2023-02-27 LAB — CBC WITH DIFFERENTIAL/PLATELET
Absolute Lymphocytes: 1337 {cells}/uL (ref 850–3900)
Absolute Monocytes: 312 {cells}/uL (ref 200–950)
Basophils Absolute: 62 {cells}/uL (ref 0–200)
Basophils Relative: 1.5 %
Eosinophils Absolute: 201 {cells}/uL (ref 15–500)
Eosinophils Relative: 4.9 %
HCT: 38.8 % (ref 35.0–45.0)
Hemoglobin: 12.6 g/dL (ref 11.7–15.5)
MCH: 30.3 pg (ref 27.0–33.0)
MCHC: 32.5 g/dL (ref 32.0–36.0)
MCV: 93.3 fL (ref 80.0–100.0)
MPV: 11 fL (ref 7.5–12.5)
Monocytes Relative: 7.6 %
Neutro Abs: 2189 {cells}/uL (ref 1500–7800)
Neutrophils Relative %: 53.4 %
Platelets: 243 10*3/uL (ref 140–400)
RBC: 4.16 10*6/uL (ref 3.80–5.10)
RDW: 13.2 % (ref 11.0–15.0)
Total Lymphocyte: 32.6 %
WBC: 4.1 10*3/uL (ref 3.8–10.8)

## 2023-02-27 LAB — COMPLETE METABOLIC PANEL WITH GFR
AG Ratio: 1.6 (calc) (ref 1.0–2.5)
ALT: 19 U/L (ref 6–29)
AST: 19 U/L (ref 10–35)
Albumin: 4 g/dL (ref 3.6–5.1)
Alkaline phosphatase (APISO): 64 U/L (ref 37–153)
BUN: 14 mg/dL (ref 7–25)
CO2: 28 mmol/L (ref 20–32)
Calcium: 9.4 mg/dL (ref 8.6–10.4)
Chloride: 106 mmol/L (ref 98–110)
Creat: 0.74 mg/dL (ref 0.50–1.05)
Globulin: 2.5 g/dL (ref 1.9–3.7)
Glucose, Bld: 76 mg/dL (ref 65–99)
Potassium: 4.6 mmol/L (ref 3.5–5.3)
Sodium: 140 mmol/L (ref 135–146)
Total Bilirubin: 0.5 mg/dL (ref 0.2–1.2)
Total Protein: 6.5 g/dL (ref 6.1–8.1)
eGFR: 88 mL/min/{1.73_m2} (ref >=60)

## 2023-02-27 LAB — HEMOGLOBIN A1C
Hgb A1c MFr Bld: 5.2 %{Hb} (ref <5.7)
Mean Plasma Glucose: 103 mg/dL
eAG (mmol/L): 5.7 mmol/L

## 2023-02-27 LAB — LIPID PANEL
Cholesterol: 219 mg/dL — ABNORMAL HIGH (ref <200)
HDL: 67 mg/dL (ref >=50)
LDL Cholesterol (Calc): 133 mg/dL — ABNORMAL HIGH
Non-HDL Cholesterol (Calc): 152 mg/dL — ABNORMAL HIGH (ref <130)
Total CHOL/HDL Ratio: 3.3 (calc) (ref <5.0)
Triglycerides: 86 mg/dL (ref <150)

## 2023-02-27 LAB — TSH: TSH: 3.59 m[IU]/L (ref 0.40–4.50)

## 2023-03-05 ENCOUNTER — Ambulatory Visit (INDEPENDENT_AMBULATORY_CARE_PROVIDER_SITE_OTHER): Payer: Medicare HMO | Admitting: Family Medicine

## 2023-03-05 VITALS — BP 150/78 | Resp 16 | Ht 64.0 in | Wt 171.2 lb

## 2023-03-05 DIAGNOSIS — Z780 Asymptomatic menopausal state: Secondary | ICD-10-CM | POA: Diagnosis not present

## 2023-03-05 DIAGNOSIS — I4819 Other persistent atrial fibrillation: Secondary | ICD-10-CM

## 2023-03-05 DIAGNOSIS — Z1231 Encounter for screening mammogram for malignant neoplasm of breast: Secondary | ICD-10-CM | POA: Diagnosis not present

## 2023-03-05 DIAGNOSIS — J0110 Acute frontal sinusitis, unspecified: Secondary | ICD-10-CM

## 2023-03-05 DIAGNOSIS — I10 Essential (primary) hypertension: Secondary | ICD-10-CM | POA: Diagnosis not present

## 2023-03-05 DIAGNOSIS — Z0000 Encounter for general adult medical examination without abnormal findings: Secondary | ICD-10-CM

## 2023-03-05 MED ORDER — AMLODIPINE BESYLATE 2.5 MG PO TABS: 2.5000 mg | ORAL_TABLET | Freq: Every day | ORAL | 3 refills | Status: DC

## 2023-03-05 MED ORDER — AMOXICILLIN-POT CLAVULANATE 875-125 MG PO TABS: 1.0000 | ORAL_TABLET | Freq: Two times a day (BID) | ORAL | 0 refills | Status: DC

## 2023-03-05 NOTE — Progress Notes (Unsigned)
Subjective:    Patient ID: Rachel Whitehead, female    DOB: Dec 12, 1955, 67 y.o.   MRN: [id number]  Rachel Whitehead is a 67 y.o. female presenting on 03/05/2023 for Annual Exam   HPI  Discussed the use of AI scribe software for clinical note transcription with the patient, who gave verbal consent to proceed.  History of Present Illness          Here for Annual Physical and Lab Review    Persistent Atrial Fibrillation Followed by [facility] Cardiology Dr Darrold Junker and EP Dr Gerre Pebbles Cardioversion x 2 previously In Spring 2024, has had Cardiac Ablation and repeat procedure Last Cardioversion per Tamsen Snider 08/10/22, completed. She was in [place] for period of time. Had x 2 episodes, and took Flecainide to resolve. Currently doing well in rhythm Continues on Xarelto 20mg  daily, she is interested to inquire about possibility of coming off of this.   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 Not wearing every night. Has in house sleep study in December. Her pressure number is 8 mmHg Has seen Pulmonologist, they adjusted pressure settings.   CHRONIC HTN: She has done well with improved BP Current Meds - None - off of Amlodipine 2.5 Reports good compliance, took meds today. Tolerating well, w/o complaints. Lifestyle: - Diet: balanced, she does a cleanse - Exercise: walking, gardening, active. Denies CP, dyspnea, HA, edema, dizziness / lightheadedness  ***Amlodipine 2.5mg     Supplement list PlasmaFlo StemEnhance Ultra Stem Cell Support Cyactiv - Inflammation Defend - Dietary Supplement - Immune Support Z-Stack - Dr Annye Asa Vitamin 0 Zinc, Quercetin, Vitamin C, Vitamin D  Bio SYNC - Gundry MD - PhytoSure, BioMax  Blood Pressure Optimizer - Potassium  Energize - Dietary Vegetarian supplement - AS NEEDED    Sinus congestion L>R pressure Rhinorrhea cough congestion mild dizzy with change of position   *** Ablation > temporary successful > then Afib > Cardioversion >  switch from Flecanide to Amiodarone     Health Maintenance:   Breast CA Screening: Due for mammogram screening. Last mammogram result >2-3+ years ago reported normal. No prior history abnormal mammogram. No known family history of breast cancer. Currently asymptomatic. - Order is in, but not scheduled yet   Colon CA Screening: Never had colonoscopy. Currently asymptomatic. No known family history of colon CA - Last Cologuard done 10/24/22 - negative, next due in 3 more years 2027.  Due for DEXA, ordered      10/02/2022    3:07 PM 03/07/2022   10:23 AM 11/30/2021    1:59 PM  Depression screen PHQ 2/9  Decreased Interest 0 0 0  Down, Depressed, Hopeless 0 0 0  PHQ - 2 Score 0 0 0  Altered sleeping 0 1 1  Tired, decreased energy 1 1 1   Change in appetite 0 0 0  Feeling bad or failure about yourself  0 0 0  Trouble concentrating 0 1 1  Moving slowly or fidgety/restless 0 0 0  Suicidal thoughts 0 0 0  PHQ-9 Score 1 3 3   Difficult doing work/chores Not difficult at all Not difficult at all Somewhat difficult       10/02/2022    3:07 PM 03/07/2022   10:23 AM 11/30/2021    1:59 PM 09/23/2021   10:56 AM  GAD 7 : Generalized Anxiety Score  Nervous, Anxious, on Edge 0 0 0 0  Control/stop worrying 0 0 0 0  Worry too much - different things 0 0 0 0  Trouble relaxing 0 0 0 1  Restless 0 0 0 0  Easily annoyed or irritable 0 0 0 0  Afraid - awful might happen 0 0 0 0  Total GAD 7 Score 0 0 0 1  Anxiety Difficulty Not difficult at all Not difficult at all Not difficult at all Not difficult at all     Past Medical History:  Diagnosis Date   Hypertension    Urinary incontinence    Past Surgical History:  Procedure Laterality Date   CARDIOVERSION N/A 02/14/2022   Procedure: CARDIOVERSION;  Surgeon: Marcina Millard, MD;  Location: [facility] ORS;  Service: Cardiovascular;  Laterality: N/A;   CARDIOVERSION N/A 02/28/2022   Procedure: CARDIOVERSION;  Surgeon: Marcina Millard, MD;   Location: [facility] ORS;  Service: Cardiovascular;  Laterality: N/A;   CARDIOVERSION N/A 03/30/2022   Procedure: CARDIOVERSION;  Surgeon: Marcina Millard, MD;  Location: [facility] ORS;  Service: Cardiovascular;  Laterality: N/A;   CARDIOVERSION N/A 10/19/2022   Procedure: CARDIOVERSION;  Surgeon: Clotilde Dieter, DO;  Location: [facility] ORS;  Service: Cardiovascular;  Laterality: N/A;   STRABISMUS SURGERY Left    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   Social History   Socioeconomic History   Marital status: Single    Spouse name: Not on file   Number of children: Not on file   Years of education: Not on file   Highest education level: Not on file  Occupational History   Not on file  Tobacco Use   Smoking status: Never   Smokeless tobacco: Not on file  Vaping Use   Vaping status: Never Used  Substance and Sexual Activity   Alcohol use: Yes    Alcohol/week: 4.0 standard drinks of alcohol    Types: 4 Standard drinks or equivalent per week   Drug use: Never   Sexual activity: Not on file  Other Topics Concern   Not on file  Social History Narrative   Not on file   Social Determinants of Health   Financial Resource Strain: Not on file  Food Insecurity: Not on file  Transportation Needs: Not on file  Physical Activity: Not on file  Stress: Not on file  Social Connections: Not on file  Intimate Partner Violence: Not on file   Family History  Problem Relation Age of Onset   Cancer Father    Breast cancer Sister 42   Current Outpatient Medications on File Prior to Visit  Medication Sig   rivaroxaban (XARELTO) 20 MG TABS tablet Take 20 mg by mouth daily with supper.   flecainide (TAMBOCOR) 50 MG tablet Take 50 mg by mouth 2 (two) times daily. (Patient not taking: Reported on 03/05/2023)   No current facility-administered medications on file prior to visit.    Review of Systems Per HPI unless specifically indicated above     Objective:    BP (!) 160/78 (BP Location: Left Arm, Patient Position: Sitting,  Cuff Size: Normal)   Resp 16   Ht 5\' 4"  (1.626 m)   Wt 171 lb 3.2 oz (77.7 kg)   BMI 29.39 kg/m   Wt Readings from Last 3 Encounters:  03/05/23 171 lb 3.2 oz (77.7 kg)  10/19/22 158 lb (71.7 kg)  10/02/22 167 lb (75.8 kg)    Physical Exam  Results for orders placed or performed in visit on 02/26/23  TSH  Result Value Ref Range   TSH 3.59 0.40 - 4.50 mIU/L  Hemoglobin A1c  Result Value Ref Range   Hgb A1c MFr Bld 5.2 <5.7 % of total  Hgb   Mean Plasma Glucose 103 mg/dL   eAG (mmol/L) 5.7 mmol/L  Lipid panel  Result Value Ref Range   Cholesterol 219 (H) <200 mg/dL   HDL 67 > OR = 50 mg/dL   Triglycerides 86 <657 mg/dL   LDL Cholesterol (Calc) 133 (H) mg/dL (calc)   Total CHOL/HDL Ratio 3.3 <5.0 (calc)   Non-HDL Cholesterol (Calc) 152 (H) <130 mg/dL (calc)  CBC with Differential/Platelet  Result Value Ref Range   WBC 4.1 3.8 - 10.8 Thousand/uL   RBC 4.16 3.80 - 5.10 Million/uL   Hemoglobin 12.6 11.7 - 15.5 g/dL   HCT 84.6 96.2 - 95.2 %   MCV 93.3 80.0 - 100.0 fL   MCH 30.3 27.0 - 33.0 pg   MCHC 32.5 32.0 - 36.0 g/dL   RDW 84.1 32.4 - 40.1 %   Platelets 243 140 - 400 Thousand/uL   MPV 11.0 7.5 - 12.5 fL   Neutro Abs 2,189 1,500 - 7,800 cells/uL   Absolute Lymphocytes 1,337 850 - 3,900 cells/uL   Absolute Monocytes 312 200 - 950 cells/uL   Eosinophils Absolute 201 15 - 500 cells/uL   Basophils Absolute 62 0 - 200 cells/uL   Neutrophils Relative % 53.4 %   Total Lymphocyte 32.6 %   Monocytes Relative 7.6 %   Eosinophils Relative 4.9 %   Basophils Relative 1.5 %  COMPLETE METABOLIC PANEL WITH GFR  Result Value Ref Range   Glucose, Bld 76 65 - 99 mg/dL   BUN 14 7 - 25 mg/dL   Creat 0.27 2.53 - 6.64 mg/dL   eGFR 88 > OR = 60 QI/HKV/4.25Z5   BUN/Creatinine Ratio SEE NOTE: 6 - 22 (calc)   Sodium 140 135 - 146 mmol/L   Potassium 4.6 3.5 - 5.3 mmol/L   Chloride 106 98 - 110 mmol/L   CO2 28 20 - 32 mmol/L   Calcium 9.4 8.6 - 10.4 mg/dL   Total Protein 6.5 6.1 - 8.1  g/dL   Albumin 4.0 3.6 - 5.1 g/dL   Globulin 2.5 1.9 - 3.7 g/dL (calc)   AG Ratio 1.6 1.0 - 2.5 (calc)   Total Bilirubin 0.5 0.2 - 1.2 mg/dL   Alkaline phosphatase (APISO) 64 37 - 153 U/L   AST 19 10 - 35 U/L   ALT 19 6 - 29 U/L      Assessment & Plan:   Problem List Items Addressed This Visit   None    Updated Health Maintenance information Reviewed recent lab results with patient Encouraged improvement to lifestyle with diet and exercise Goal of weight loss  Assessment and Plan              No orders of the defined types were placed in this encounter.   No orders of the defined types were placed in this encounter.    Follow up plan: No follow-ups on file.  Saralyn Pilar, DO [facility] 03/05/2023, 1:34 PM

## 2023-03-05 NOTE — Patient Instructions (Addendum)
Thank you for coming to the office today.  For Mammogram screening for breast cancer + DEXA  Call the Imaging Center below anytime to schedule your own appointment now that order has been placed.  [facility] [address], [address] [place], [place] [address] Phone: [phone]  ---------------------  Consider Statin therapy or trial temporarily and if needed we would pursue Repatha injectable cholesterol  Start BP medication Amlodipine 2.5mg  daily for now due to roller coaster elevations with BP.  If not improving sinuses can take Augmentin within 1 week of onset   Please schedule a Follow-up Appointment to: Return in about 3 months (around 06/03/2023) for 3 month HTN update, AFib Cards, and ?Cholesterol Med.  If you have any other questions or concerns, please feel free to call the office or send a message through MyChart. You may also schedule an earlier appointment if necessary.  Additionally, you may be receiving a survey about your experience at our office within a few days to 1 week by e-mail or mail. We value your feedback.  Saralyn Pilar, DO [facility], [place]

## 2023-03-06 ENCOUNTER — Encounter: Payer: Self-pay | Admitting: Family Medicine

## 2023-03-09 DIAGNOSIS — I4819 Other persistent atrial fibrillation: Secondary | ICD-10-CM | POA: Diagnosis not present

## 2023-03-22 DIAGNOSIS — G4733 Obstructive sleep apnea (adult) (pediatric): Secondary | ICD-10-CM | POA: Diagnosis not present

## 2023-04-09 DIAGNOSIS — Z7901 Long term (current) use of anticoagulants: Secondary | ICD-10-CM | POA: Insufficient documentation

## 2023-05-08 ENCOUNTER — Ambulatory Visit: Payer: Self-pay | Admitting: *Deleted

## 2023-05-08 NOTE — Telephone Encounter (Signed)
  Chief Complaint: Blood in urine for the last 3-4 days.  Frequency. Symptoms: above Frequency: above Pertinent Negatives: Patient denies Flank or abd pain.   No burning with urination. Disposition: [] ED /[] Urgent Care (no appt availability in office) / [x] Appointment(In office/virtual)/ []  [facility]/ [] Home Care/ [] Refused Recommended Disposition /[] [facility]/ []  Follow-up with PCP Additional Notes: Appt made with Dr. Edman for 05/09/2023 at 1:40.

## 2023-05-08 NOTE — Telephone Encounter (Signed)
 Reason for Disposition  Urinating more frequently than usual (i.e., frequency)  Answer Assessment - Initial Assessment Questions 1. SYMPTOM: What's the main symptom you're concerned about? (e.g., frequency, incontinence)     I'm seeing blood in my urine this morning.   2. ONSET: When did the  blood  start?     3-4 days ago 3. PAIN: Is there any pain? If Yes, ask: How bad is it? (Scale: 1-10; mild, moderate, severe)     No burning or back or abd pain. I go often more than usual right now.   4. CAUSE: What do you think is causing the symptoms?     I don't know 5. OTHER SYMPTOMS: Do you have any other symptoms? (e.g., blood in urine, fever, flank pain, pain with urination)     Blood in urine.     History of kidney very Kory time a go 6. PREGNANCY: Is there any chance you are pregnant? When was your last menstrual period?     N/A due to age  Protocols used: Urinary Symptoms-A-AH

## 2023-05-09 ENCOUNTER — Ambulatory Visit
Admission: RE | Admit: 2023-05-09 | Discharge: 2023-05-09 | Disposition: A | Discharge status: Home / Self Care | Payer: Medicare HMO | Source: Ambulatory Visit | Attending: Family Medicine | Admitting: Family Medicine

## 2023-05-09 ENCOUNTER — Ambulatory Visit (INDEPENDENT_AMBULATORY_CARE_PROVIDER_SITE_OTHER): Payer: Medicare HMO | Admitting: Family Medicine

## 2023-05-09 ENCOUNTER — Encounter: Payer: Self-pay | Admitting: Family Medicine

## 2023-05-09 VITALS — BP 150/88 | HR 58 | Ht 64.0 in | Wt 175.0 lb

## 2023-05-09 DIAGNOSIS — R310 Gross hematuria: Secondary | ICD-10-CM

## 2023-05-09 DIAGNOSIS — I4819 Other persistent atrial fibrillation: Secondary | ICD-10-CM | POA: Diagnosis not present

## 2023-05-09 LAB — POCT URINALYSIS DIPSTICK
Bilirubin, UA: NEGATIVE
Glucose, UA: NEGATIVE
Ketones, UA: NEGATIVE
Nitrite, UA: NEGATIVE
Odor: NEGATIVE
Protein, UA: NEGATIVE
Spec Grav, UA: 1.02 (ref 1.010–1.025)
Urobilinogen, UA: 0.2 U/dL
pH, UA: 7.5 (ref 5.0–8.0)

## 2023-05-09 MED ORDER — RIVAROXABAN 20 MG PO TABS: 20.0000 mg | ORAL_TABLET | Freq: Every day | ORAL | 0 refills | Status: DC

## 2023-05-09 MED ORDER — IOHEXOL 300 MG/ML  SOLN
100.0000 mL | Freq: Once | INTRAMUSCULAR | Status: AC | PRN
  Administered 2023-05-09: 100 mL via INTRAVENOUS

## 2023-05-09 MED ORDER — CEPHALEXIN 500 MG PO CAPS: 500.0000 mg | ORAL_CAPSULE | Freq: Three times a day (TID) | ORAL | 0 refills | Status: DC

## 2023-05-09 NOTE — Progress Notes (Signed)
 Subjective:    Patient ID: Rachel Whitehead, female    DOB: [birth year], 68 y.o.   MRN: [id number]  Rachel Whitehead is a 69 y.o. female presenting on 05/09/2023 for Hematuria (Gross hematuria)   HPI  Discussed the use of AI scribe software for clinical note transcription with the patient, who gave verbal consent to proceed.  History of Present Illness     Rachel Whitehead is a 68 year old female with atrial fibrillation who presents with hematuria.  She has been experiencing hematuria for the past three to four days, with the blood described as bright red and more pronounced in the morning. The bleeding lessens after urinating a few times but does not completely resolve. She initially thought it might be related to dietary factors, but the symptoms persisted. This is her first episode of visible blood in the urine.  No history of kidney stones. No flank pain nausea vomiting.  She is currently taking Xarelto  for atrial fibrillation and has been on it for about two years. She inquires if the medication could be contributing to the bleeding.  No associated symptoms such as burning, pain, or side pain. She mentions feeling chilly in warm rooms but has not experienced a fever or cough.           03/05/2023    1:40 PM 10/02/2022    3:07 PM 03/07/2022   10:23 AM  Depression screen PHQ 2/9  Decreased Interest 0 0 0  Down, Depressed, Hopeless 1 0 0  PHQ - 2 Score 1 0 0  Altered sleeping 2 0 1  Tired, decreased energy 2 1 1   Change in appetite 0 0 0  Feeling bad or failure about yourself  1 0 0  Trouble concentrating 2 0 1  Moving slowly or fidgety/restless 0 0 0  Suicidal thoughts 0 0 0  PHQ-9 Score 8 1 3   Difficult doing work/chores Somewhat difficult Not difficult at all Not difficult at all       03/05/2023    1:40 PM 10/02/2022    3:07 PM 03/07/2022   10:23 AM 11/30/2021    1:59 PM  GAD 7 : Generalized Anxiety Score  Nervous, Anxious, on Edge 1 0 0 0  Control/stop worrying 1 0 0 0  Worry  too much - different things 1 0 0 0  Trouble relaxing 1 0 0 0  Restless 0 0 0 0  Easily annoyed or irritable 0 0 0 0  Afraid - awful might happen 1 0 0 0  Total GAD 7 Score 5 0 0 0  Anxiety Difficulty  Not difficult at all Not difficult at all Not difficult at all    Social History   Tobacco Use   Smoking status: Never  Vaping Use   Vaping status: Never Used  Substance Use Topics   Alcohol use: Yes    Alcohol/week: 4.0 standard drinks of alcohol    Types: 4 Standard drinks or equivalent per week   Drug use: Never    Review of Systems Per HPI unless specifically indicated above     Objective:    BP (!) 150/88 (BP Location: Left Arm, Cuff Size: Normal)   Pulse (!) 58   Ht 5' 4 (1.626 m)   Wt 175 lb (79.4 kg)   SpO2 96%   BMI 30.04 kg/m   Wt Readings from Last 3 Encounters:  05/09/23 175 lb (79.4 kg)  03/05/23 171 lb 3.2 oz (77.7 kg)  10/19/22 158 lb (  71.7 kg)    Physical Exam Vitals and nursing note reviewed.  Constitutional:      General: She is not in acute distress.    Appearance: Normal appearance. She is well-developed. She is not diaphoretic.     Comments: Well-appearing, comfortable, cooperative  HENT:     Head: Normocephalic and atraumatic.  Eyes:     General:        Right eye: No discharge.        Left eye: No discharge.     Conjunctiva/sclera: Conjunctivae normal.  Cardiovascular:     Rate and Rhythm: Normal rate.  Pulmonary:     Effort: Pulmonary effort is normal.  Skin:    General: Skin is warm and dry.     Findings: No erythema or rash.  Neurological:     Mental Status: She is alert and oriented to person, place, and time.  Psychiatric:        Mood and Affect: Mood normal.        Behavior: Behavior normal.        Thought Content: Thought content normal.     Comments: Well groomed, good eye contact, normal speech and thoughts     I have personally reviewed the radiology report from 05/09/23 on CT Hematuria Protocol.  CLINICAL DATA:   Gross hematuria, on Xarelto    EXAM: CT ABDOMEN AND PELVIS WITHOUT AND WITH CONTRAST   TECHNIQUE: Multidetector CT imaging of the abdomen and pelvis was performed following the standard protocol before and following the bolus administration of intravenous contrast.   RADIATION DOSE REDUCTION: This exam was performed according to the departmental dose-optimization program which includes automated exposure control, adjustment of the mA and/or kV according to patient size and/or use of iterative reconstruction technique.   CONTRAST:  OMNIPAQUE  IOHEXOL  300 MG/ML  SOLN   COMPARISON:  None Available.   FINDINGS: Lower chest: No acute abnormality.   Hepatobiliary: No solid liver abnormality is seen. Gallstone. No gallbladder wall thickening, or biliary dilatation.   Pancreas: Unremarkable. No pancreatic ductal dilatation or surrounding inflammatory changes.   Spleen: Normal in size without significant abnormality.   Adrenals/Urinary Tract: Adrenal glands are unremarkable. Nonobstructive calculus in the left inferior pole collecting system measuring 0.8 cm (series 2, image 26). Additional punctuate nonobstructive calculi and medullary calcifications scattered throughout both kidneys. No ureteral calculi or hydronephrosis. Bladder is unremarkable.   Stomach/Bowel: Stomach is within normal limits. Appendix not clearly visualized. No evidence of bowel wall thickening, distention, or inflammatory changes. Large burden of stool throughout the colon and rectum.   Vascular/Lymphatic: Aortic atherosclerosis. No enlarged abdominal or pelvic lymph nodes.   Reproductive: No mass or other significant abnormality.   Other: No abdominal wall hernia or abnormality. No ascites.   Musculoskeletal: No acute or significant osseous findings.   IMPRESSION: 1. Nonobstructive calculus in the left inferior pole collecting system measuring 0.8 cm. Additional punctuate nonobstructive  calculi and medullary calcifications scattered throughout both kidneys. No ureteral calculi or hydronephrosis. 2. No evidence of renal mass or suspicious contrast enhancement. 3. Large burden of stool throughout the colon and rectum. 4. Cholelithiasis.   Aortic Atherosclerosis (ICD10-I70.0).     Electronically Signed   By: Marolyn JONETTA Jaksch M.D.   On: 05/09/2023 16:44  Results for orders placed or performed in visit on 05/09/23  POCT Urinalysis Dipstick   Collection Time: 05/09/23  1:46 PM  Result Value Ref Range   Color, UA red    Clarity, UA clear  Glucose, UA Negative Negative   Bilirubin, UA negative    Ketones, UA negative    Spec Grav, UA 1.020 1.010 - 1.025   Blood, UA large    pH, UA 7.5 5.0 - 8.0   Protein, UA Negative Negative   Urobilinogen, UA 0.2 0.2 or 1.0 E.U./dL   Nitrite, UA negative    Leukocytes, UA Moderate (2+) (A) Negative   Appearance     Odor negative       Assessment & Plan:   Problem List Items Addressed This Visit     Persistent atrial fibrillation (HCC)   Relevant Medications   rivaroxaban  (XARELTO ) 20 MG TABS tablet   Other Visit Diagnoses       Gross hematuria    -  Primary   Relevant Medications   cephALEXin  (KEFLEX ) 500 MG capsule   Other Relevant Orders   POCT Urinalysis Dipstick (Completed)   Urine Culture   CT HEMATURIA WORKUP (Completed)         Gross Hematuria Bright red blood in urine for the past 3-4 days, worse in the morning. No pain or discomfort. Urinalysis shows blood and leukocytes, suggestive of possible infection. No prior history of hematuria. Discussed goal to rule out concerning causes of bleeding such as malignancy or stones -Send urine for culture and sensitivity to confirm or rule out infection. -Order  STAT CT scan of kidneys to identify any potential source of bleeding. -If infection is confirmed, start Keflex  (1 capsule TID for 7 days). - Rx sent. -If no infection is found, consider referral to Urology  for further evaluation, including potential cystoscopy. -If vaginal/uterine bleeding is identified, consider referral to Gynecology.  Results reviewed on CT, attempted to call patient. Results sent to mychart. One large to moderate nephrolithiasis that is non obstructing not likely cause of the bleeding but may pursue Urology in future.  Atrial Fibrillation On Xarelto  20mg  daily. No current symptoms of AFib. -Continue Xarelto  20mg  daily. -Refill Xarelto  prescription for 30 days / lifetime free sample 30 day coupon given -Discuss potential role of Xarelto  in causing hematuria.  Upcoming Cardiology plans -  [facility] Procedure scheduled -Follow instructions for Xarelto  use around the time of procedure.         Orders Placed This Encounter  Procedures   Urine Culture   CT HEMATURIA WORKUP    Standing Status:   Future    Number of Occurrences:   1    Expiration Date:   05/08/2024    Reason for Exam (SYMPTOM  OR DIAGNOSIS REQUIRED):   gross hematuria for several days persistent. on xarelto , rule out other GU sources of bleeding    Preferred imaging location?:   [facility]   POCT Urinalysis Dipstick    Meds ordered this encounter  Medications   cephALEXin  (KEFLEX ) 500 MG capsule    Sig: Take 1 capsule (500 mg total) by mouth 3 (three) times daily. For 7 days    Dispense:  21 capsule    Refill:  0   rivaroxaban  (XARELTO ) 20 MG TABS tablet    Sig: Take 1 tablet (20 mg total) by mouth daily with supper.    Dispense:  30 tablet    Refill:  0    Follow up plan: Return if symptoms worsen or fail to improve.   Marsa Officer, DO [facility] 05/09/2023, 1:58 PM

## 2023-05-09 NOTE — Patient Instructions (Addendum)
 Thank you for coming to the office today.  Xarelto  refill for 30 day  POSSIBLE UTI WAIT FOR Urine Culture Result. If we confirm it is an infection - Start Keflex  500mg  3 times daily for next 7 days, complete entire course, even if feeling better - We sent urine for a culture, we will call you within next few days if we need to change antibiotics - Please drink plenty of fluids, improve hydration over next 1 week  If symptoms worsening, developing nausea / vomiting, worsening back pain, fevers / chills / sweats, then please return for re-evaluation sooner.  If you take AZO OTC - limit this to 2-3 days MAX to avoid affecting kidneys  D-Mannose is a natural supplement that can actually help bind to urinary bacteria and reduce their effectiveness it can help prevent UTI from forming, and may reduce some symptoms. It likely cannot cure an active UTI but it is worth a try and good to prevent them with. Try 500mg  twice a day at a full dose if you want, or check package instructions for more info  ------------------  CT Scan ordered, pending approval and scheduling  Based on CT results and Urine we can consider Urology referral to rule out other causes of bleeding.  If - it turns out to be vaginal / uterine bleeding, contact us  back and we can follow-up    Please schedule a Follow-up Appointment to: Return if symptoms worsen or fail to improve.  If you have any other questions or concerns, please feel free to call the office or send a message through MyChart. You may also schedule an earlier appointment if necessary.  Additionally, you may be receiving a survey about your experience at our office within a few days to 1 week by e-mail or mail. We value your feedback.  Marsa Officer, DO [facility], [place]

## 2023-05-10 ENCOUNTER — Telehealth: Payer: Self-pay | Admitting: Family Medicine

## 2023-05-10 DIAGNOSIS — N200 Calculus of kidney: Secondary | ICD-10-CM

## 2023-05-10 DIAGNOSIS — R310 Gross hematuria: Secondary | ICD-10-CM

## 2023-05-10 NOTE — Telephone Encounter (Signed)
 Referral sent to [facility] for Nephrolithiasis.  Domingo Friend, DO [facility] 05/10/2023, 1:25 PM

## 2023-05-10 NOTE — Telephone Encounter (Signed)
 Pt. Given PCP message. Would like a referral to Urology. Agrees she does not want gallbladder surgery. Please advise pt.

## 2023-05-10 NOTE — Addendum Note (Signed)
 Addended by: Raina Bunting on: 05/10/2023 01:25 PM   Modules accepted: Orders

## 2023-05-10 NOTE — Telephone Encounter (Signed)
 Left message for patient to call back. To inform her the referral has been sent

## 2023-05-11 ENCOUNTER — Encounter: Payer: Self-pay | Admitting: Family Medicine

## 2023-05-11 LAB — URINE CULTURE
MICRO NUMBER:: 16046749
SPECIMEN QUALITY:: ADEQUATE

## 2023-05-25 ENCOUNTER — Ambulatory Visit: Payer: Medicare HMO | Admitting: Family Medicine

## 2023-05-25 ENCOUNTER — Ambulatory Visit: Payer: Self-pay | Admitting: Family Medicine

## 2023-05-25 ENCOUNTER — Encounter: Payer: Self-pay | Admitting: Family Medicine

## 2023-05-25 VITALS — BP 142/84 | HR 63 | Ht 64.0 in | Wt 175.0 lb

## 2023-05-25 DIAGNOSIS — N131 Hydronephrosis with ureteral stricture, not elsewhere classified: Secondary | ICD-10-CM | POA: Diagnosis not present

## 2023-05-25 DIAGNOSIS — K5900 Constipation, unspecified: Secondary | ICD-10-CM | POA: Diagnosis not present

## 2023-05-25 NOTE — Progress Notes (Signed)
 Subjective:    Patient ID: Rachel Whitehead, female    DOB: 02/18/1956, 68 y.o.   MRN: [id number]  Rachel Whitehead is a 68 y.o. female presenting on 05/25/2023 for Constipation and Hydronephrosis   HPI  Discussed the use of AI scribe software for clinical note transcription with the patient, who gave verbal consent to proceed.  History of Present Illness    Rachel Whitehead is a 68 year old female who presents for a hospital follow-up after a recent hospitalization due to complications from a CTA scan.   HOSPITAL FOLLOW-UP VISIT  Hospital/Location: [facility] Date of Admission: 05/18/23 Date of Discharge: 05/22/23 Transitions of care telephone call: Zonia Kief RN Transitional Care  Reason for Admission: Hydronephrosis, Infection  - Hospital H&P and Discharge Summary have been reviewed - Patient presents today 3 days after recent hospitalization.   She underwent a CTA scan on May 18, 2023, which resulted in severe pain due to irritation from the contrast fluid, leading to her admission to the emergency room until May 22, 2023. During her hospitalization, she experienced extremely low blood pressure and high pain levels. 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 She was discharged with antibiotics to manage the infection and prevent further complications.  Since discharge, she has experienced worsening pain, lack of appetite, significant bloating, and lower abdominal pain. She has been unable to eat much and has gained seven pounds, likely due to fluid retention and bloating. Her bowel movements are described as diarrhea with very thin, dark, and possibly blood-tinged stools, indicating potential bleeding. She has been taking Tylenol for pain and antibiotics as prescribed, but has not used stronger pain medications like oxycodone.  She takes Xarelto and has had hematuria problem before  and now question if any GI blood loss contributing to darker stools.  Her current medications include antibiotics, which she started on May 23, 2023, for a ten-day course. She drinks water and tea, and occasionally adds electrolytes to her water to maintain hydration.  Last dose antibiotics 06/02/23  - Today reports overall has had symptoms after discharge. Symptoms of abdominal bloating constipation abdominal pain discomfort  - New medications on discharge: Augmentin Keflex - Changes to current meds on discharge: none  I have reviewed the discharge medication list, and have reconciled the current and discharge medications today.   Current Outpatient Medications:    amLODipine (NORVASC) 2.5 MG tablet, Take 1 tablet (2.5 mg total) by mouth daily., Disp: 90 tablet, Rfl: 3   amoxicillin-clavulanate (AUGMENTIN) 875-125 MG tablet, Take 1 tablet by mouth 2 (two) times daily., Disp: , Rfl:    rivaroxaban (XARELTO) 20 MG TABS tablet, Take 1 tablet (20 mg total) by mouth daily with supper., Disp: 30 tablet, Rfl: 0   amiodarone (PACERONE) 200 MG tablet, Take 200 mg by mouth daily. (Patient not taking: Reported on 05/25/2023), Disp: , Rfl:    cephALEXin (KEFLEX) 500 MG capsule, Take 1 capsule (500 mg total) by mouth 3 (three) times daily. For 7 days (Patient not taking: Reported on 05/25/2023), Disp: 21 capsule, Rfl: 0  ------------------------------------------------------------------------- Social History   Tobacco Use   Smoking status: Never  Vaping Use   Vaping status: Never Used  Substance Use Topics   Alcohol use: Yes    Alcohol/week: 4.0 standard drinks of alcohol    Types: 4 Standard drinks or equivalent per week   Drug use: Never    Review  of Systems Per HPI unless specifically indicated above     Objective:    BP (!) 142/84 (BP Location: Left Arm, Cuff Size: Normal)   Pulse 63   Ht 5\' 4"  (1.626 m)   Wt 175 lb (79.4 kg)   SpO2 96%   BMI 30.04 kg/m   Wt Readings from Last  3 Encounters:  05/25/23 175 lb (79.4 kg)  05/09/23 175 lb (79.4 kg)  03/05/23 171 lb 3.2 oz (77.7 kg)    Physical Exam Vitals and nursing note reviewed.  Constitutional:      General: She is not in acute distress.    Appearance: Normal appearance. She is well-developed. She is not diaphoretic.     Comments: Well-appearing, comfortable, cooperative  HENT:     Head: Normocephalic and atraumatic.  Eyes:     General:        Right eye: No discharge.        Left eye: No discharge.     Conjunctiva/sclera: Conjunctivae normal.  Cardiovascular:     Rate and Rhythm: Normal rate.  Pulmonary:     Effort: Pulmonary effort is normal.  Abdominal:     General: There is distension.     Palpations: There is no mass.     Tenderness: There is abdominal tenderness. There is no guarding or rebound.     Hernia: No hernia is present.     Comments: Hyperactive bowel sounds  Skin:    General: Skin is warm and dry.     Findings: No erythema or rash.  Neurological:     Mental Status: She is alert and oriented to person, place, and time.  Psychiatric:        Mood and Affect: Mood normal.        Behavior: Behavior normal.        Thought Content: Thought content normal.     Comments: Well groomed, good eye contact, normal speech and thoughts     I have personally reviewed the radiology report from 05/18/23 CTA.  CLINICAL DATA:  Gross hematuria, on Xarelto   EXAM: CT ABDOMEN AND PELVIS WITHOUT AND WITH CONTRAST   TECHNIQUE: Multidetector CT imaging of the abdomen and pelvis was performed following the standard protocol before and following the bolus administration of intravenous contrast.   RADIATION DOSE REDUCTION: This exam was performed according to the departmental dose-optimization program which includes automated exposure control, adjustment of the mA and/or kV according to patient size and/or use of iterative reconstruction technique.   CONTRAST:  OMNIPAQUE IOHEXOL 300 MG/ML   SOLN   COMPARISON:  None Available.   FINDINGS: Lower chest: No acute abnormality.   Hepatobiliary: No solid liver abnormality is seen. Gallstone. No gallbladder wall thickening, or biliary dilatation.   Pancreas: Unremarkable. No pancreatic ductal dilatation or surrounding inflammatory changes.   Spleen: Normal in size without significant abnormality.   Adrenals/Urinary Tract: Adrenal glands are unremarkable. Nonobstructive calculus in the left inferior pole collecting system measuring 0.8 cm (series 2, image 26). Additional punctuate nonobstructive calculi and medullary calcifications scattered throughout both kidneys. No ureteral calculi or hydronephrosis. Bladder is unremarkable.   Stomach/Bowel: Stomach is within normal limits. Appendix not clearly visualized. No evidence of bowel wall thickening, distention, or inflammatory changes. Large burden of stool throughout the colon and rectum.   Vascular/Lymphatic: Aortic atherosclerosis. No enlarged abdominal or pelvic lymph nodes.   Reproductive: No mass or other significant abnormality.   Other: No abdominal wall hernia or abnormality. No ascites.  Musculoskeletal: No acute or significant osseous findings.   IMPRESSION: 1. Nonobstructive calculus in the left inferior pole collecting system measuring 0.8 cm. Additional punctuate nonobstructive calculi and medullary calcifications scattered throughout both kidneys. No ureteral calculi or hydronephrosis. 2. No evidence of renal mass or suspicious contrast enhancement. 3. Large burden of stool throughout the colon and rectum. 4. Cholelithiasis.   Aortic Atherosclerosis (ICD10-I70.0).     Electronically Signed   By: Jearld Lesch M.D.   On: 05/09/2023 16:44  Results for orders placed or performed in visit on 05/09/23  POCT Urinalysis Dipstick   Collection Time: 05/09/23  1:46 PM  Result Value Ref Range   Color, UA red    Clarity, UA clear    Glucose, UA  Negative Negative   Bilirubin, UA negative    Ketones, UA negative    Spec Grav, UA 1.020 1.010 - 1.025   Blood, UA large    pH, UA 7.5 5.0 - 8.0   Protein, UA Negative Negative   Urobilinogen, UA 0.2 0.2 or 1.0 E.U./dL   Nitrite, UA negative    Leukocytes, UA Moderate (2+) (A) Negative   Appearance     Odor negative   Urine Culture   Collection Time: 05/09/23  2:25 PM   Specimen: Urine  Result Value Ref Range   MICRO NUMBER: [id number]    SPECIMEN QUALITY: Adequate    Sample Source URINE, CLEAN CATCH    STATUS: FINAL    ISOLATE 1: Escherichia coli (A)       Susceptibility   Escherichia coli - URINE CULTURE, REFLEX    AMOX/CLAVULANIC 4 Sensitive     AMPICILLIN 16 Intermediate     AMPICILLIN/SULBACTAM 4 Sensitive     CEFAZOLIN* <=4 Not Reportable      * For infections other than uncomplicated UTI caused by E. coli, K. pneumoniae or P. mirabilis: Cefazolin is resistant if MIC > or = 8 mcg/mL. (Distinguishing susceptible versus intermediate for isolates with MIC < or = 4 mcg/mL requires additional testing.) For uncomplicated UTI caused by E. coli, K. pneumoniae or P. mirabilis: Cefazolin is susceptible if MIC <32 mcg/mL and predicts susceptible to the oral agents cefaclor, cefdinir, cefpodoxime, cefprozil, cefuroxime, cephalexin and loracarbef.     CEFTAZIDIME <=1 Sensitive     CEFEPIME <=1 Sensitive     CEFTRIAXONE <=1 Sensitive     CIPROFLOXACIN <=0.25 Sensitive     LEVOFLOXACIN <=0.12 Sensitive     GENTAMICIN <=1 Sensitive     IMIPENEM <=0.25 Sensitive     NITROFURANTOIN 32 Sensitive     PIP/TAZO <=4 Sensitive     TOBRAMYCIN <=1 Sensitive     TRIMETH/SULFA* <=20 Sensitive      * For infections other than uncomplicated UTI caused by E. coli, K. pneumoniae or P. mirabilis: Cefazolin is resistant if MIC > or = 8 mcg/mL. (Distinguishing susceptible versus intermediate for isolates with MIC < or = 4 mcg/mL requires additional testing.) For uncomplicated UTI caused by  E. coli, K. pneumoniae or P. mirabilis: Cefazolin is susceptible if MIC <32 mcg/mL and predicts susceptible to the oral agents cefaclor, cefdinir, cefpodoxime, cefprozil, cefuroxime, cephalexin and loracarbef. Legend: S = Susceptible  I = Intermediate R = Resistant  NS = Not susceptible SDD = Susceptible Dose Dependent * = Not Tested  NR = Not Reported **NN = See Therapy Comments       Assessment & Plan:   Problem List Items Addressed This Visit   None Visit Diagnoses  Constipation, unspecified constipation type    -  Primary     Hydronephrosis with ureteral stricture, not elsewhere classified           Kidney Stone and Hydronephrosis Recent hospitalization for kidney rupture due to hydronephrosis. Kidney stone remains in place. Currently on antibiotics for local infection. Surgical procedure cystourethrscopy planned after infection clears. -Continue antibiotics as prescribed. -Contact [facility] team for preoperative evaluation as scheduled 10 days before surgery Discussion today that she should follow-up with [facility] after she resolves the constipation issue and improves appetite early next week to check status of further management, and if they need to evaluate her before upcoming procedure.  Constipation Reports of bloating and abdominal pain. Previous imaging showed large volume of stool. Current symptoms suggest constipation with overflow diarrhea / liquid stool around the constipation, prior CT demonstrated large burden of colonic stool  -Start Miralax, 2-4 capfuls daily for bowel cleanout. -Adjust dose based on response and stool consistency.  Concern dark stools reported. At risk of GI bleed while on Xarelto, if noted already experienced hematuria issues. Question if anticoagulation causing notable side effects. Will need to be re-visited once acute situation is resolved.  Acute Blood Loss Anemia Mild Anemia, Hgb 10.3, showed some improvement from 9  range, last lab 3 days ago, too soon to repeat has upcoming pre-op anticipated repeat labs.  Reviewed oral rehydration strategy  Poor Appetite and Weight Gain Likely related to abdominal discomfort from constipation and recent hospitalization. -Address constipation and monitor appetite response. -Encourage hydration with electrolyte solutions.  Follow-up Monitor response to Miralax and improvement in abdominal discomfort. Reevaluate as needed.         No orders of the defined types were placed in this encounter.   Follow up plan: Return if symptoms worsen or fail to improve.   Saralyn Pilar, DO [facility] 05/25/2023, 2:51 PM

## 2023-05-25 NOTE — Patient Instructions (Addendum)
Thank you for coming to the office today.  For Constipation (less frequent bowel movement that can be hard dry or involve straining).  Recommend trying OTC Miralax 17g = 1 capful in large glass water once daily for now, try several days to see if working, goal is soft stool or BM 1-2 times daily, if too loose then reduce dose or try every other day. If not effective may need to increase it to 2 doses at once in AM or may do 1 in morning and 1 in afternoon/evening  - This medicine is very safe and can be used often without any problem and will not make you dehydrated. It is good for use on AS NEEDED BASIS or even MAINTENANCE therapy for longer term for several days to weeks at a time to help regulate bowel movements  Other more natural remedies or preventative treatment: - Increase hydration with water - Increase fiber in diet (high fiber foods = vegetables, leafy greens, oats/grains) - May take OTC Fiber supplement (metamucil powder or pill/gummy) - May try OTC Probiotic   Please schedule a Follow-up Appointment to: Return if symptoms worsen or fail to improve.  If you have any other questions or concerns, please feel free to call the office or send a message through MyChart. You may also schedule an earlier appointment if necessary.  Additionally, you may be receiving a survey about your experience at our office within a few days to 1 week by e-mail or mail. We value your feedback.  Saralyn Pilar, DO [facility], [place]

## 2023-05-25 NOTE — Telephone Encounter (Signed)
 Chief Complaint: stomach pain  Symptoms: bloating, diarrhea, loss of appetite   Frequency: comes   Disposition: [] ED /[] Urgent Care (no appt availability in office) / [x] Appointment(In office/virtual)/ []  [place] [facility]/ [] Home Care/ [] Refused Recommended Disposition /[] [place] [facility]/ []  Follow-up with PCP Additional Notes:Daughter Huntley Dec is  calling  on behalf of pt with stomach pain from bloating and diarrhea.Pt also has loss of appetite. Pt hasn't eaten much in last 7 days. Pt was hospitalized on 2/14 for kidney infection and was discharged on 2/18. Last night the bloating/diarrhea started. Pt rated pain 4-5.  Pt has taken tylenol for pain and gets some relief. Pt is currently taking Amoxicillin for 10 days.  Per protocol, pt to be seen within 4 hours. Pt has appt today at 1420 with PCP. RN gave care advice and pt verbalized understanding.            Copied from CRM [id number]. Topic: Clinical - Red Word Triage >> May 25, 2023  8:34 AM Geroge Baseman wrote: Red Word that prompted transfer to Nurse Triage: Patient is at home taking antibiotics for kidney infection. She was just recently in the hospital at [facility]. Now having a different pain than before that feels worse, very bloated and no desire to eat. Pain feels similar to period cramps. Reason for Disposition  [1] MILD-MODERATE pain AND [2] constant AND [3] present > 2 hours  Answer Assessment - Initial Assessment Questions 1. LOCATION: "Where does it hurt?"     Whole lower belly  2. RADIATION: "Does the pain shoot anywhere else?" (e.g., chest, back)     Shoots to left and around back  3. ONSET: "When did the pain begin?" (e.g., minutes, hours or days ago)      Yesterday  4. SUDDEN: "Gradual or sudden onset?"     Sudden  5. PATTERN "Does the pain come and go, or is it constant?"    - If it comes and goes: "How [place] does it last?" "Do you have pain now?"     (Note: Comes and goes means the pain is intermittent. It  goes away completely between bouts.)    - If constant: "Is it getting better, staying the same, or getting worse?"      (Note: Constant means the pain never goes away completely; most serious pain is constant and gets worse.)      Comes and goes 6. SEVERITY: "How bad is the pain?"  (e.g., Scale 1-10; mild, moderate, or severe)    - MILD (1-3): Doesn't interfere with normal activities, abdomen soft and not tender to touch.     - MODERATE (4-7): Interferes with normal activities or awakens from sleep, abdomen tender to touch.     - SEVERE (8-10): Excruciating pain, doubled over, unable to do any normal activities.       4-5 7. RECURRENT SYMPTOM: "Have you ever had this type of stomach pain before?" If Yes, ask: "When was the last time?" and "What happened that time?"      Reminds of her of menstrual cramps  8. CAUSE: "What do you think is causing the stomach pain?"     Unsure  9. RELIEVING/AGGRAVATING FACTORS: "What makes it better or worse?" (e.g., antacids, bending or twisting motion, bowel movement)     Taking Tylenol and heating pad 10. OTHER SYMPTOMS: "Do you have any other symptoms?" (e.g., back pain, diarrhea, fever, urination pain, vomiting)  Diarrhea and bloating, loss of appetite  Protocols used: Abdominal Pain -  Female-A-AH

## 2023-06-04 ENCOUNTER — Encounter: Payer: Self-pay | Admitting: Family Medicine

## 2023-06-04 ENCOUNTER — Ambulatory Visit: Payer: Medicare HMO | Admitting: Family Medicine

## 2023-06-04 VITALS — BP 138/84 | HR 54 | Ht 64.0 in | Wt 167.0 lb

## 2023-06-04 DIAGNOSIS — R202 Paresthesia of skin: Secondary | ICD-10-CM | POA: Diagnosis not present

## 2023-06-04 DIAGNOSIS — I4819 Other persistent atrial fibrillation: Secondary | ICD-10-CM

## 2023-06-04 MED ORDER — RIVAROXABAN 20 MG PO TABS: 20.0000 mg | ORAL_TABLET | Freq: Every day | ORAL | 0 refills | Status: DC

## 2023-06-04 MED ORDER — ALPHA-LIPOIC ACID 600 MG PO TABS: 600.0000 mg | ORAL_TABLET | Freq: Three times a day (TID) | ORAL | Status: DC

## 2023-06-04 NOTE — Patient Instructions (Addendum)
 Thank you for coming to the office today.  X-ray Lumbar spine anytime when ready, Mon-Thurs 8am-4pm. Walk in, no apt.  After X-ray - we can refer to Neurologist for Nerve Conduction study as well if we need to learn more.  Try Alpha Lipoic Acid 600mg  3 times a day for nerve health supplement / vitamin  --------------  2 more weeks Xarelto ordered, take until your procedure, talk with Cardiology about how [place] to continue afterward.  May stop Miralax and use AS NEEDED  Future discussion on cholesterol medication. Not required today  For Mammogram screening for breast cancer   Call the Imaging Center below anytime to schedule your own appointment now that order has been placed.  [facility] [address], [address] [place], [place] [address] Phone: [phone]  ---------------------     Please schedule a Follow-up Appointment to: Return if symptoms worsen or fail to improve.  If you have any other questions or concerns, please feel free to call the office or send a message through MyChart. You may also schedule an earlier appointment if necessary.  Additionally, you may be receiving a survey about your experience at our office within a few days to 1 week by e-mail or mail. We value your feedback.  Saralyn Pilar, DO [facility], [place]

## 2023-06-04 NOTE — Progress Notes (Signed)
 Subjective:    Patient ID: Rachel Whitehead, female    DOB: [birth year], 68 y.o.   MRN: [id number]  Rachel Whitehead is a 68 y.o. female presenting on 06/04/2023 for Atrial Fibrillation   HPI  Discussed the use of AI scribe software for clinical note transcription with the patient, who gave verbal consent to proceed.  History of Present Illness   Rachel Whitehead is a 68 year old female who presents for a routine follow-up.  Updates since last routine visit 3 month ago and recent hospitalization  She has been experiencing diarrhea for the past sixteen days. She has been using Miralax, which may have contributed to the symptoms, and mentions being on a 'really heavy duty antibiotic.' Imaging showed significant stool buildup, which she hopes has cleared out. Still having loose stools.  Persistent Atrial Fibrillation Followed by [facility] Cardiology Dr Darrold Junker and EP Dr Gerre Pebbles Ablation > temporary successful > then Afib > Cardioversion > switch from Flecanide to Amiodarone now off both meds. Continues on Xarelto 20mg  daily Has upcoming Watchman procedure. She is awaiting a Watchman procedure, recommended after a second opinion from Dr. Lupita Shutter at [facility], who deemed her an excellent candidate. Her insurance initially denied the procedure, and an appeal is in process. The procedure is tentatively scheduled for June 27, 2023. She is taking Xarelto 20 mg daily with supper and has a two-week supply ordered. She has previously been on amiodarone and flecainide, which are no longer part of her regimen.  Gross Hematuria / Kidney Stone She is dealing with a kidney stone issue, with a scope procedure scheduled for June 18, 2023, to address hematuria and the stone, which she describes as a 'time bomb.' She is concerned about the potential for more bleeding due to her current medication, Xarelto.  Lower Extremity Paresthesia / Neuropathy She reports experiencing tingling and numbness in her right leg,  described as 'like pins and needles' and 'like I have two different legs.' This sensation occurs daily, lasting for hours, and is not associated with any specific activity or position. No back pain, and the sensation is more pronounced in the lower leg.  She is due for a mammogram screening but prefers to wait until after current health issues are resolved.      OSA on CPAP   Hyperlipidemia The patient's lab results were generally good, with normal sugar, thyroid, blood count, kidney, and liver function. However, the patient's LDL cholesterol level increased from 102 to 133. The patient has gained some weight, which could be contributing to the elevated cholesterol. The patient is considering cholesterol medication but is not ready to commit to it yet.   CHRONIC HTN: She has done well with improved BP Current Meds - Amlodipine 2.5 intermittently Reports good compliance, took meds today. Tolerating well, w/o complaints. Lifestyle: - Diet: balanced, she does a cleanse - Exercise: walking, gardening, active. Denies CP, dyspnea, HA, edema, dizziness / lightheadedness      06/04/2023   11:31 AM 05/25/2023    2:32 PM 03/05/2023    1:40 PM  Depression screen PHQ 2/9  Decreased Interest 1 1 0  Down, Depressed, Hopeless 0 0 1  PHQ - 2 Score 1 1 1   Altered sleeping 1 1 2   Tired, decreased energy 2 3 2   Change in appetite 2 3 0  Feeling bad or failure about yourself  0 1 1  Trouble concentrating 1 2 2   Moving slowly or fidgety/restless 0 0 0  Suicidal thoughts 0 0  0  PHQ-9 Score 7 11 8   Difficult doing work/chores Very difficult Very difficult Somewhat difficult       06/04/2023   11:31 AM 05/25/2023    2:32 PM 03/05/2023    1:40 PM 10/02/2022    3:07 PM  GAD 7 : Generalized Anxiety Score  Nervous, Anxious, on Edge 0 0 1 0  Control/stop worrying 0 0 1 0  Worry too much - different things 0 0 1 0  Trouble relaxing 0 0 1 0  Restless 0 0 0 0  Easily annoyed or irritable 0 0 0 0  Afraid -  awful might happen 0 0 1 0  Total GAD 7 Score 0 0 5 0  Anxiety Difficulty Not difficult at all   Not difficult at all    Social History   Tobacco Use   Smoking status: Never  Vaping Use   Vaping status: Never Used  Substance Use Topics   Alcohol use: Yes    Alcohol/week: 4.0 standard drinks of alcohol    Types: 4 Standard drinks or equivalent per week   Drug use: Never    Review of Systems Per HPI unless specifically indicated above     Objective:    BP (!) 142/84   Pulse (!) 54   Ht 5\' 4"  (1.626 m)   Wt 167 lb (75.8 kg)   SpO2 99%   BMI 28.67 kg/m   Wt Readings from Last 3 Encounters:  06/04/23 167 lb (75.8 kg)  05/25/23 175 lb (79.4 kg)  05/09/23 175 lb (79.4 kg)    Physical Exam Vitals and nursing note reviewed.  Constitutional:      General: She is not in acute distress.    Appearance: Normal appearance. She is well-developed. She is not diaphoretic.     Comments: Well-appearing, comfortable, cooperative  HENT:     Head: Normocephalic and atraumatic.  Eyes:     General:        Right eye: No discharge.        Left eye: No discharge.     Conjunctiva/sclera: Conjunctivae normal.  Cardiovascular:     Rate and Rhythm: Normal rate.  Pulmonary:     Effort: Pulmonary effort is normal.  Skin:    General: Skin is warm and dry.     Findings: No erythema or rash.  Neurological:     Mental Status: She is alert and oriented to person, place, and time.  Psychiatric:        Mood and Affect: Mood normal.        Behavior: Behavior normal.        Thought Content: Thought content normal.     Comments: Well groomed, good eye contact, normal speech and thoughts     Results for orders placed or performed in visit on 05/09/23  POCT Urinalysis Dipstick   Collection Time: 05/09/23  1:46 PM  Result Value Ref Range   Color, UA red    Clarity, UA clear    Glucose, UA Negative Negative   Bilirubin, UA negative    Ketones, UA negative    Spec Grav, UA 1.020 1.010 -  1.025   Blood, UA large    pH, UA 7.5 5.0 - 8.0   Protein, UA Negative Negative   Urobilinogen, UA 0.2 0.2 or 1.0 E.U./dL   Nitrite, UA negative    Leukocytes, UA Moderate (2+) (A) Negative   Appearance     Odor negative   Urine Culture   Collection  Time: 05/09/23  2:25 PM   Specimen: Urine  Result Value Ref Range   MICRO NUMBER: [id number]    SPECIMEN QUALITY: Adequate    Sample Source URINE, CLEAN CATCH    STATUS: FINAL    ISOLATE 1: Escherichia coli (A)       Susceptibility   Escherichia coli - URINE CULTURE, REFLEX    AMOX/CLAVULANIC 4 Sensitive     AMPICILLIN 16 Intermediate     AMPICILLIN/SULBACTAM 4 Sensitive     CEFAZOLIN* <=4 Not Reportable      * For infections other than uncomplicated UTI caused by E. coli, K. pneumoniae or P. mirabilis: Cefazolin is resistant if MIC > or = 8 mcg/mL. (Distinguishing susceptible versus intermediate for isolates with MIC < or = 4 mcg/mL requires additional testing.) For uncomplicated UTI caused by E. coli, K. pneumoniae or P. mirabilis: Cefazolin is susceptible if MIC <32 mcg/mL and predicts susceptible to the oral agents cefaclor, cefdinir, cefpodoxime, cefprozil, cefuroxime, cephalexin and loracarbef.     CEFTAZIDIME <=1 Sensitive     CEFEPIME <=1 Sensitive     CEFTRIAXONE <=1 Sensitive     CIPROFLOXACIN <=0.25 Sensitive     LEVOFLOXACIN <=0.12 Sensitive     GENTAMICIN <=1 Sensitive     IMIPENEM <=0.25 Sensitive     NITROFURANTOIN 32 Sensitive     PIP/TAZO <=4 Sensitive     TOBRAMYCIN <=1 Sensitive     TRIMETH/SULFA* <=20 Sensitive      * For infections other than uncomplicated UTI caused by E. coli, K. pneumoniae or P. mirabilis: Cefazolin is resistant if MIC > or = 8 mcg/mL. (Distinguishing susceptible versus intermediate for isolates with MIC < or = 4 mcg/mL requires additional testing.) For uncomplicated UTI caused by E. coli, K. pneumoniae or P. mirabilis: Cefazolin is susceptible if MIC <32 mcg/mL and  predicts susceptible to the oral agents cefaclor, cefdinir, cefpodoxime, cefprozil, cefuroxime, cephalexin and loracarbef. Legend: S = Susceptible  I = Intermediate R = Resistant  NS = Not susceptible SDD = Susceptible Dose Dependent * = Not Tested  NR = Not Reported **NN = See Therapy Comments       Assessment & Plan:   Problem List Items Addressed This Visit     Persistent atrial fibrillation (HCC)   Relevant Medications   rivaroxaban (XARELTO) 20 MG TABS tablet   Other Visit Diagnoses       Paresthesia of right lower extremity    -  Primary   Relevant Medications   Alpha-Lipoic Acid 600 MG TABS   Other Relevant Orders   DG Lumbar Spine Complete        Gastrointestinal Disturbance Recent prolonged diarrhea, possibly secondary to antibiotic use. Stool impaction noted on previous imaging. Improvement noted with Miralax use. -Discontinue regular use of Miralax and use as needed for constipation.  Hyperlipidemia Last cholesterol panel checked in November. Decision to delay rechecking and reconsideration of cholesterol treatment due to recent health events. -Plan to recheck cholesterol panel later in the year.  Atrial Fibrillation Currently on Xarelto 20mg  daily with supper. Recent bleeding complications possibly related to anticoagulation. -Continue Xarelto 20mg  daily with supper. -Order two-week supply of Xarelto. Notify us or cardiology if need re order or continuation  Nephrolithiasis / Urolithiasis Followed by Urology Upcoming urology procedures scheduled to address kidney stone and hematuria issues. -Continue with planned urology procedures and follow-up.  RLE Paresthesia Daily, frequent tingling in the right leg, possibly nerve-related. No clear positional triggers. No back pain reported. -Order x-ray  of lower back and hips to assess for possible nerve impingement. -Consider referral to neurologist for nerve function test if x-ray results suggest nerve  impingement. -Trial of alpha lipoic acid 600mg  three times daily as a nerve health supplement.  General Health Maintenance -Mammogram screening due. Patient to decide on scheduling. -Schedule follow-up appointment in three months for general check-up and possible cholesterol discussion.         Orders Placed This Encounter  Procedures   DG Lumbar Spine Complete    Standing Status:   Future    Expiration Date:   06/03/2024    Reason for Exam (SYMPTOM  OR DIAGNOSIS REQUIRED):   right lower extremity paresthesia    Preferred imaging location?:   [facility]    Meds ordered this encounter  Medications   rivaroxaban (XARELTO) 20 MG TABS tablet    Sig: Take 1 tablet (20 mg total) by mouth daily with supper.    Dispense:  15 tablet    Refill:  0    2 week supply   Alpha-Lipoic Acid 600 MG TABS    Sig: Take 600 mg by mouth 3 (three) times daily.    Follow up plan: Return in about 3 months (around 09/04/2023) for 3 month Follow-up .   Saralyn Pilar, DO [facility] 06/04/2023, 10:56 AM

## 2023-06-18 DIAGNOSIS — N132 Hydronephrosis with renal and ureteral calculous obstruction: Secondary | ICD-10-CM | POA: Insufficient documentation

## 2023-06-20 ENCOUNTER — Encounter: Payer: Self-pay | Admitting: Family Medicine

## 2023-06-20 ENCOUNTER — Ambulatory Visit: Payer: Self-pay

## 2023-06-20 ENCOUNTER — Telehealth: Payer: Self-pay

## 2023-06-20 ENCOUNTER — Telehealth: Admitting: Family Medicine

## 2023-06-20 DIAGNOSIS — N12 Tubulo-interstitial nephritis, not specified as acute or chronic: Secondary | ICD-10-CM | POA: Diagnosis not present

## 2023-06-20 DIAGNOSIS — A0472 Enterocolitis due to Clostridium difficile, not specified as recurrent: Secondary | ICD-10-CM | POA: Diagnosis not present

## 2023-06-20 NOTE — Telephone Encounter (Signed)
 Chief Complaint: diarrhea Symptoms: diarrhea, C. Diff positive Frequency: over a month Pertinent Negatives: Patient denies nausea, vomiting, abd pain, fever Disposition: [] ED /[] Urgent Care (no appt availability in office) / [x] Appointment(In office/virtual)/ []  [place] [facility]/ [] Home Care/ [] Refused Recommended Disposition /[] [place] [facility]/ []  Follow-up with PCP Additional Notes: Pt reports diarrhea for over a month. Pt has been taking Keflex for pyelonephritis. Pt was hospitalized in February for 5 days and recently had a stent placed. Pt was seen yesterday at urology and tested positive for C. Diff. Pt was prescribed Vanc but has not picked it up or taken it. Pt states she would prefer alternative therapy being that she has been on so many antibiotics. Pt is seeking to have a fecal transplant instead and is looking to be referred to GI for that. Pt denies N/V or abdominal pain. States diarrhea varies, sometimes she has it 7x/day and sometimes less. RN advised pt she would need to be seen within 24 hrs. RN scheduled pt in the office for today at 1440. Pt verbalized understanding.  After ending the call with the pt, RN realized that the office may not want the pt arriving in person given that she has C. Diff. RN called the CAL. CAL asked that the pt not come into the office and stated they can see the pt virtually at the same time. RN called the pt back to confirm that pt has video capability for a virtual appt. RN dialed pt's number many different ways but received the message "call cannot be completed as dialed." RN not able to get in touch with the pt to alert her that this appt would need to be held virtually.  Office now on lunch break. Please follow-up with the patient.   Reason for Disposition  MODERATE diarrhea (e.g., 4-6 times / day more than normal)  Answer Assessment - Initial Assessment Questions 1. ANTIBIOTIC: "What antibiotic are you taking?" "How many times per  day?"     Keflex 2. ANTIBIOTIC ONSET: "When was the antibiotic started?"     2/5 per chart  3. DIARRHEA SEVERITY: "How bad is the diarrhea?" "How many more stools have you had in the past 24 hours than normal?"    - NO DIARRHEA (SCALE 0)   - MILD (SCALE 1-3): Few loose or mushy BMs; increase of 1-3 stools over normal daily number of stools; mild increase in ostomy output.   -  MODERATE (SCALE 4-7): Increase of 4-6 stools daily over normal; moderate increase in ostomy output. * SEVERE (SCALE 8-10; OR 'WORST POSSIBLE'): Increase of 7 or more stools daily over normal; moderate increase in ostomy output; incontinence.     "Sometimes 4, sometimes 8, sometimes just a little bit" 4. ONSET: "When did the diarrhea begin?"      Over 1 month ago 5. BM CONSISTENCY: "How loose or watery is the diarrhea?"      Very watery 6. VOMITING: "Are you also vomiting?" If Yes, ask: "How many times in the past 24 hours?"      No N/V 7. ABDOMEN PAIN: "Are you having any abdomen pain?" If Yes, ask: "What does it feel like?" (e.g., crampy, dull, intermittent, constant)      No 8. ABDOMEN PAIN SEVERITY: If present, ask: "How bad is the pain?"  (e.g., Scale 1-10; mild, moderate, or severe)   - MILD (1-3): doesn't interfere with normal activities, abdomen soft and not tender to touch    - MODERATE (4-7): interferes with normal  activities or awakens from sleep, abdomen tender to touch    - SEVERE (8-10): excruciating pain, doubled over, unable to do any normal activities       No 9. ORAL INTAKE: If vomiting, "Have you been able to drink liquids?" "How much liquids have you had in the past 24 hours?"     N/A 10. HYDRATION: "Any signs of dehydration?" (e.g., dry mouth [not just dry lips], too weak to stand, dizziness, new weight loss) "When did you last urinate?"       No - "I drink a lot", states she is still urinating (states she had stent placed to "drain the infection") 11. EXPOSURE: "Have you traveled to a foreign  country recently?" "Have you been exposed to anyone with diarrhea?" "Could you have eaten any food that was spoiled?"       No 12. OTHER SYMPTOMS: "Do you have any other symptoms?" (e.g., fever, blood in stool)       Was in the ED for a kidney stone, stayed in the hospital for 5 days (2/14) and has had diarrhea since then. Currently taking Keflex. Was going to have stone removed but they were unable to do that because the infection was so bad, per pt. Stent was placed. Now pt has C. Diff and was prescribed Vanc, states she has not picked it up or taken it. Looking for alternative treatment. Needs a referral to GI for a fecal transplant.  Protocols used: Diarrhea on Antibiotics-A-AH

## 2023-06-20 NOTE — Progress Notes (Signed)
 Subjective:    Patient ID: Benjamine Mola, female    DOB: 1955/11/12, 68 y.o.   MRN: [id number]  Levenia Skalicky is a 68 y.o. female presenting on 06/20/2023 for C Diff   Virtual / Telehealth Encounter - Video Visit via MyChart The purpose of this virtual visit is to provide medical care while limiting exposure to the novel coronavirus (COVID19) for both patient and office staff.  Consent was obtained for remote visit:  Yes.   Answered questions that patient had about telehealth interaction:  Yes.   I discussed the limitations, risks, security and privacy concerns of performing an evaluation and management service by video/telephone. I also discussed with the patient that there may be a patient responsible charge related to this service. The patient expressed understanding and agreed to proceed.  Patient Location: Home Provider Location: [facility] (Office)  Participants in virtual visit: - Patient: Cerena Baine / Son Kathee Polite - CMA: Fuller Plan CMA - Provider: Dr Althea Charon   HPI  Discussed the use of AI scribe software for clinical note transcription with the patient, who gave verbal consent to proceed.  History of Present Illness   Karoline Fleer is a 68 year old female who presents with concerns about Clostridioides difficile (C. diff) infection following recent antibiotic use. She is accompanied by her son.  She has been on multiple antibiotics over the past month, including a broad-spectrum antibiotic during a recent hospitalization, followed by IV ceftriaxone. She was discharged with Augmentin (amoxicillin clavulanate) and later prescribed cefalexin, which she did not take.  She was hospitalized due to kidney stone complications, which led to a kidney infection. The infection persisted despite initial antibiotic treatment due to a blockage that prevented the antibiotic from reaching the infection site. A stent was placed for drainage, and she was prescribed repeat  Keflex course but has not started.   Goal to clear the infection before her scheduled kidney stone procedure on March 31st.  She reports experiencing diarrhea, which was initially thought to be overflow diarrhea related to stool buildup. However, a stool test confirmed the presence of C. diff. She has been experiencing ongoing symptoms since March 17th, when she was scheduled for a procedure but was found to still have signs of infection.   She was given rx Vancomycin oral for C Diff but has not filled it yet.  She has not had C Diff before. Given her situation requiring to treat this before can have cystoscopy/ ureteroscopy to remove stone she is interested to treat the C Diff with FMT      06/04/2023   11:31 AM 05/25/2023    2:32 PM 03/05/2023    1:40 PM  Depression screen PHQ 2/9  Decreased Interest 1 1 0  Down, Depressed, Hopeless 0 0 1  PHQ - 2 Score 1 1 1   Altered sleeping 1 1 2   Tired, decreased energy 2 3 2   Change in appetite 2 3 0  Feeling bad or failure about yourself  0 1 1  Trouble concentrating 1 2 2   Moving slowly or fidgety/restless 0 0 0  Suicidal thoughts 0 0 0  PHQ-9 Score 7 11 8   Difficult doing work/chores Very difficult Very difficult Somewhat difficult       06/04/2023   11:31 AM 05/25/2023    2:32 PM 03/05/2023    1:40 PM 10/02/2022    3:07 PM  GAD 7 : Generalized Anxiety Score  Nervous, Anxious, on Edge 0 0 1 0  Control/stop worrying 0 0 1 0  Worry too much - different things 0 0 1 0  Trouble relaxing 0 0 1 0  Restless 0 0 0 0  Easily annoyed or irritable 0 0 0 0  Afraid - awful might happen 0 0 1 0  Total GAD 7 Score 0 0 5 0  Anxiety Difficulty Not difficult at all   Not difficult at all    Social History   Tobacco Use   Smoking status: Never  Vaping Use   Vaping status: Never Used  Substance Use Topics   Alcohol use: Yes    Alcohol/week: 4.0 standard drinks of alcohol    Types: 4 Standard drinks or equivalent per week   Drug use: Never     Review of Systems Per HPI unless specifically indicated above     Objective:    There were no vitals taken for this visit.  Wt Readings from Last 3 Encounters:  06/04/23 167 lb (75.8 kg)  05/25/23 175 lb (79.4 kg)  05/09/23 175 lb (79.4 kg)     Physical Exam  Note examination was completely remotely via video observation objective data only  Gen - well-appearing, no acute distress or apparent pain, comfortable HEENT - eyes appear clear without discharge or redness Heart/Lungs - cannot examine virtually - observed no evidence of coughing or labored breathing. Abd - cannot examine virtually  Skin - face visible today- no rash Neuro - awake, alert, oriented Psych - not anxious appearing   Results for orders placed or performed in visit on 05/09/23  POCT Urinalysis Dipstick   Collection Time: 05/09/23  1:46 PM  Result Value Ref Range   Color, UA red    Clarity, UA clear    Glucose, UA Negative Negative   Bilirubin, UA negative    Ketones, UA negative    Spec Grav, UA 1.020 1.010 - 1.025   Blood, UA large    pH, UA 7.5 5.0 - 8.0   Protein, UA Negative Negative   Urobilinogen, UA 0.2 0.2 or 1.0 E.U./dL   Nitrite, UA negative    Leukocytes, UA Moderate (2+) (A) Negative   Appearance     Odor negative   Urine Culture   Collection Time: 05/09/23  2:25 PM   Specimen: Urine  Result Value Ref Range   MICRO NUMBER: [id number]    SPECIMEN QUALITY: Adequate    Sample Source URINE, CLEAN CATCH    STATUS: FINAL    ISOLATE 1: Escherichia coli (A)       Susceptibility   Escherichia coli - URINE CULTURE, REFLEX    AMOX/CLAVULANIC 4 Sensitive     AMPICILLIN 16 Intermediate     AMPICILLIN/SULBACTAM 4 Sensitive     CEFAZOLIN* <=4 Not Reportable      * For infections other than uncomplicated UTI caused by E. coli, K. pneumoniae or P. mirabilis: Cefazolin is resistant if MIC > or = 8 mcg/mL. (Distinguishing susceptible versus intermediate for isolates with MIC < or = 4  mcg/mL requires additional testing.) For uncomplicated UTI caused by E. coli, K. pneumoniae or P. mirabilis: Cefazolin is susceptible if MIC <32 mcg/mL and predicts susceptible to the oral agents cefaclor, cefdinir, cefpodoxime, cefprozil, cefuroxime, cephalexin and loracarbef.     CEFTAZIDIME <=1 Sensitive     CEFEPIME <=1 Sensitive     CEFTRIAXONE <=1 Sensitive     CIPROFLOXACIN <=0.25 Sensitive     LEVOFLOXACIN <=0.12 Sensitive     GENTAMICIN <=1 Sensitive  IMIPENEM <=0.25 Sensitive     NITROFURANTOIN 32 Sensitive     PIP/TAZO <=4 Sensitive     TOBRAMYCIN <=1 Sensitive     TRIMETH/SULFA* <=20 Sensitive      * For infections other than uncomplicated UTI caused by E. coli, K. pneumoniae or P. mirabilis: Cefazolin is resistant if MIC > or = 8 mcg/mL. (Distinguishing susceptible versus intermediate for isolates with MIC < or = 4 mcg/mL requires additional testing.) For uncomplicated UTI caused by E. coli, K. pneumoniae or P. mirabilis: Cefazolin is susceptible if MIC <32 mcg/mL and predicts susceptible to the oral agents cefaclor, cefdinir, cefpodoxime, cefprozil, cefuroxime, cephalexin and loracarbef. Legend: S = Susceptible  I = Intermediate R = Resistant  NS = Not susceptible SDD = Susceptible Dose Dependent * = Not Tested  NR = Not Reported **NN = See Therapy Comments       Assessment & Plan:   Problem List Items Addressed This Visit   None Visit Diagnoses       C. difficile colitis    -  Primary   Relevant Orders   Ambulatory referral to Gastroenterology     Pyelonephritis            Pyelonephritis / Kidney infection Nephrolithiasis Followed by Urology  Persistent due to blockage, requiring stent placement. Scheduled for kidney stone removal on July 02, 2023. Cephalexin necessary for infection clearance and procedural readiness. Start Keflex as prescribed by Urology - understanding that it could increase risk of further C Diff concerns. Ensure  readiness for kidney stone removal on July 02, 2023.  Clostridioides difficile (C. diff) infection Likely from recent broad-spectrum antibiotic use.   Oral vancomycin is traditional treatment; FMT considered for recurrent cases Referral for FMT uncertain for first episode. Patient requesting this option as first line treatment if possible before her Kidney surgery.  [place] discussion today about treatment plan for C Diff and options. Discussed oral antibiotic Vancomycin effective as oral agent for C Diff and does not get absorbed by body She should pick up oral rx and start Vancomycin while we wait this time.  Referral sent for FMT evaluation by GI       Orders Placed This Encounter  Procedures   Ambulatory referral to Gastroenterology    Referral Priority:   Routine    Referral Type:   Consultation    Referral Reason:   Specialty Services Required    Number of Visits Requested:   1    No orders of the defined types were placed in this encounter.   Follow up plan: Return if symptoms worsen or fail to improve.   Patient verbalizes understanding with the above medical recommendations including the limitation of remote medical advice.  Specific follow-up and call-back criteria were given for patient to follow-up or seek medical care more urgently if needed.  Total duration of direct patient care provided via video conference: 20 minutes   Saralyn Pilar, DO [facility] 06/20/2023, 2:59 PM

## 2023-06-20 NOTE — Telephone Encounter (Signed)
 Copied from CRM [id number]. Topic: Clinical - Prescription Issue >> Jun 20, 2023  9:45 AM Rachel Whitehead wrote: Reason for CRM: Pt called and stated that she has been diagnoised with c-diff and was given an antibiotic for this. However she is already on an antibiotic and was wondering is another course of treatment could be recommended for this.  There is a treatment center in [facility] for this. I sent a separate CRM for referral

## 2023-06-20 NOTE — Telephone Encounter (Signed)
 Spoke with patient. Confirmed this is to be virtual due to the infection. Patient was confused, about in person or virtual she will log in at appointment time not to come in office.

## 2023-06-20 NOTE — Patient Instructions (Addendum)

## 2023-06-20 NOTE — Telephone Encounter (Signed)
 Copied from CRM [id number]. Topic: Appointments - Scheduling Inquiry for Clinic >> Jun 20, 2023 12:52 PM Rachel Whitehead L wrote: Reason for CRM: Patient requesting clarification if visit today is supposed to be in person or virtual. Patient states she did not request it to be virtual and would like it to be in person.   Patient requesting callback, [phone]

## 2023-06-20 NOTE — Telephone Encounter (Signed)
 Copied from CRM [id number]. Topic: Referral - Request for Referral >> Jun 20, 2023  9:40 AM Priscille Loveless wrote: Did the patient discuss referral with their provider in the last year? No   Appointment offered? No  Type of order/referral and detailed reason for visit: Pt has been diagnoised with c-diffile toxin tia (gastroenterologist at [facility])   Preference of office, provider, location: [facility]  If referral order, have you been seen by this specialty before? No (If Yes, this issue or another issue? When? Where?  Can we respond through MyChart? Yes

## 2023-06-20 NOTE — Telephone Encounter (Signed)
 She has virtual visit today for this reason. We will address it at visit. I see other note for referral as well.  Saralyn Pilar, DO [facility] 06/20/2023, 12:10 PM

## 2023-06-21 ENCOUNTER — Ambulatory Visit: Payer: Medicare HMO | Admitting: Urology

## 2023-06-26 DIAGNOSIS — Z01818 Encounter for other preprocedural examination: Secondary | ICD-10-CM | POA: Insufficient documentation

## 2023-09-04 ENCOUNTER — Other Ambulatory Visit: Payer: Self-pay | Admitting: Family Medicine

## 2023-09-04 ENCOUNTER — Ambulatory Visit
Admission: RE | Admit: 2023-09-04 | Discharge: 2023-09-04 | Disposition: A | Discharge status: Home / Self Care | Source: Ambulatory Visit | Attending: Family Medicine | Admitting: Family Medicine

## 2023-09-04 ENCOUNTER — Ambulatory Visit (INDEPENDENT_AMBULATORY_CARE_PROVIDER_SITE_OTHER): Admitting: Family Medicine

## 2023-09-04 ENCOUNTER — Encounter: Payer: Self-pay | Admitting: Family Medicine

## 2023-09-04 VITALS — BP 136/80 | HR 54 | Ht 64.0 in | Wt 171.4 lb

## 2023-09-04 DIAGNOSIS — E7800 Pure hypercholesterolemia, unspecified: Secondary | ICD-10-CM | POA: Diagnosis not present

## 2023-09-04 DIAGNOSIS — I4819 Other persistent atrial fibrillation: Secondary | ICD-10-CM | POA: Diagnosis not present

## 2023-09-04 DIAGNOSIS — I10 Essential (primary) hypertension: Secondary | ICD-10-CM

## 2023-09-04 DIAGNOSIS — R202 Paresthesia of skin: Secondary | ICD-10-CM | POA: Diagnosis present

## 2023-09-04 DIAGNOSIS — R7309 Other abnormal glucose: Secondary | ICD-10-CM

## 2023-09-04 DIAGNOSIS — N200 Calculus of kidney: Secondary | ICD-10-CM

## 2023-09-04 DIAGNOSIS — E559 Vitamin D deficiency, unspecified: Secondary | ICD-10-CM

## 2023-09-04 DIAGNOSIS — Z0000 Encounter for general adult medical examination without abnormal findings: Secondary | ICD-10-CM

## 2023-09-04 DIAGNOSIS — E538 Deficiency of other specified B group vitamins: Secondary | ICD-10-CM

## 2023-09-04 NOTE — Patient Instructions (Addendum)
 Thank you for coming to the office today.  Lumbar Spine X-ray today  Results will be on mychart / call if needed, we can consider PT and or Orthopedic based on results.  Keep on current meds per Cardiology  DUE for FASTING BLOOD WORK (no food or drink after midnight before the lab appointment, only water or coffee without cream/sugar on the morning of)  SCHEDULE "Lab Only" visit in the morning at the clinic for lab draw in 6 MONTHS   - Make sure Lab Only appointment is at about 1 week before your next appointment, so that results will be available  For Lab Results, once available within 2-3 days of blood draw, you can can log in to MyChart online to view your results and a brief explanation. Also, we can discuss results at next follow-up visit.   Please schedule a Follow-up Appointment to: Return for 6 month fasting lab > 1 week later Annual Physical.  If you have any other questions or concerns, please feel free to call the office or send a message through MyChart. You may also schedule an earlier appointment if necessary.  Additionally, you may be receiving a survey about your experience at our office within a few days to 1 week by e-mail or mail. We value your feedback.  Domingo Friend, DO [facility], [place]

## 2023-09-04 NOTE — Addendum Note (Signed)
 Addended by: Raina Bunting on: 09/04/2023 11:52 PM   Modules accepted: Orders

## 2023-09-04 NOTE — Progress Notes (Signed)
 Subjective:    Patient ID: Rachel Whitehead, female    DOB: Nov 06, 1955, 68 y.o.   MRN: [id number]  Rachel Whitehead is a 68 y.o. female presenting on 09/04/2023 for Atrial Fibrillation   HPI  Discussed the use of AI scribe software for clinical note transcription with the patient, who gave verbal consent to proceed.  History of Present Illness   Rachel Whitehead is a 68 year old female with recurrent atrial fibrillation who presents for follow-up after recent procedures and medication adjustments.      Paroxysmal Atrial Fibrillation   Followed by [facility] History of prior ablations in 2024, then Watchman procedure 06/27/23 She has persistent AFib with rate controlled and has episodic flares and episodes mostly daily They discontinued Xarelto , and now on Dual Anti Platelet therapy low dose Aspirin 45mg  + Plavix for 4.5mg  Continues on Flecainide per Cardiology, with goal to transition to AS NEEDED  She completed a Holter monitor for 7 days, waiting on results  CHRONIC HTN: Reports home BP 130/80s She was previously on metoprolol, which was discontinued due to bradycardia. Current Meds - Amlodipine  2.5mg    Denies CP, dyspnea, HA, edema, dizziness / lightheadedness  She has a history of kidney stones, for which she underwent lithotripsy and removal in April 2025. There have been two or three attempts to address the kidney stones.  She has a history of C. difficile infection, which has resolved, with a follow-up appointment scheduled for September 17, 2023.   RLE Sciatica / Lumbar DDD She experiences right leg symptoms described as 'pins and needles' and paresthesia, attributed to sciatica. These symptoms are intermittent. A lumbar spine x-ray was planned to investigate these symptoms further.  Health Maintenance:  Decline Pneumonia vaccine  Next Cologuard in July 2027  Future Mammogram     09/04/2023    8:32 AM 06/04/2023   11:31 AM 05/25/2023    2:32 PM  Depression screen  PHQ 2/9  Decreased Interest 0 1 1  Down, Depressed, Hopeless 0 0 0  PHQ - 2 Score 0 1 1  Altered sleeping 2 1 1   Tired, decreased energy 1 2 3   Change in appetite 0 2 3  Feeling bad or failure about yourself  0 0 1  Trouble concentrating 1 1 2   Moving slowly or fidgety/restless 0 0 0  Suicidal thoughts 0 0 0  PHQ-9 Score 4 7 11   Difficult doing work/chores Somewhat difficult Very difficult Very difficult       09/04/2023    8:32 AM 06/04/2023   11:31 AM 05/25/2023    2:32 PM 03/05/2023    1:40 PM  GAD 7 : Generalized Anxiety Score  Nervous, Anxious, on Edge 0 0 0 1  Control/stop worrying 0 0 0 1  Worry too much - different things 0 0 0 1  Trouble relaxing 1 0 0 1  Restless 0 0 0 0  Easily annoyed or irritable 0 0 0 0  Afraid - awful might happen 0 0 0 1  Total GAD 7 Score 1 0 0 5  Anxiety Difficulty Somewhat difficult Not difficult at all      Social History   Tobacco Use   Smoking status: Never  Vaping Use   Vaping status: Never Used  Substance Use Topics   Alcohol use: Yes    Alcohol/week: 4.0 standard drinks of alcohol    Types: 4 Standard drinks or equivalent per week   Drug use: Never    Review of  Systems Per HPI unless specifically indicated above     Objective:     BP 136/80 (BP Location: Left Arm, Cuff Size: Normal)   Pulse (!) 54   Ht 5\' 4"  (1.626 m)   Wt 171 lb 6 oz (77.7 kg)   SpO2 98%   BMI 29.42 kg/m   Wt Readings from Last 3 Encounters:  09/04/23 171 lb 6 oz (77.7 kg)  06/04/23 167 lb (75.8 kg)  05/25/23 175 lb (79.4 kg)    Physical Exam Vitals and nursing note reviewed.  Constitutional:      General: She is not in acute distress.    Appearance: She is well-developed. She is not diaphoretic.     Comments: Well-appearing, comfortable, cooperative  HENT:     Head: Normocephalic and atraumatic.  Eyes:     General:        Right eye: No discharge.        Left eye: No discharge.     Conjunctiva/sclera: Conjunctivae normal.  Neck:      Thyroid: No thyromegaly.  Cardiovascular:     Rate and Rhythm: Regular rhythm. Bradycardia present.     Heart sounds: Normal heart sounds. No murmur heard.    Comments: No ectopy Pulmonary:     Effort: Pulmonary effort is normal. No respiratory distress.     Breath sounds: Normal breath sounds. No wheezing or rales.  Musculoskeletal:        General: Normal range of motion.     Cervical back: Normal range of motion and neck supple.  Lymphadenopathy:     Cervical: No cervical adenopathy.  Skin:    General: Skin is warm and dry.     Findings: No erythema or rash.  Neurological:     Mental Status: She is alert and oriented to person, place, and time.  Psychiatric:        Behavior: Behavior normal.     Comments: Well groomed, good eye contact, normal speech and thoughts     Results for orders placed or performed in visit on 05/09/23  POCT Urinalysis Dipstick   Collection Time: 05/09/23  1:46 PM  Result Value Ref Range   Color, UA red    Clarity, UA clear    Glucose, UA Negative Negative   Bilirubin, UA negative    Ketones, UA negative    Spec Grav, UA 1.020 1.010 - 1.025   Blood, UA large    pH, UA 7.5 5.0 - 8.0   Protein, UA Negative Negative   Urobilinogen, UA 0.2 0.2 or 1.0 E.U./dL   Nitrite, UA negative    Leukocytes, UA Moderate (2+) (A) Negative   Appearance     Odor negative   Urine Culture   Collection Time: 05/09/23  2:25 PM   Specimen: Urine  Result Value Ref Range   MICRO NUMBER: [id number]    SPECIMEN QUALITY: Adequate    Sample Source URINE, CLEAN CATCH    STATUS: FINAL    ISOLATE 1: Escherichia coli (A)       Susceptibility   Escherichia coli - URINE CULTURE, REFLEX    AMOX/CLAVULANIC 4 Sensitive     AMPICILLIN 16 Intermediate     AMPICILLIN/SULBACTAM 4 Sensitive     CEFAZOLIN* <=4 Not Reportable      * For infections other than uncomplicated UTI caused by E. coli, K. pneumoniae or P. mirabilis: Cefazolin is resistant if MIC > or = 8  mcg/mL. (Distinguishing susceptible versus intermediate for isolates with MIC < or =  4 mcg/mL requires additional testing.) For uncomplicated UTI caused by E. coli, K. pneumoniae or P. mirabilis: Cefazolin is susceptible if MIC <32 mcg/mL and predicts susceptible to the oral agents cefaclor, cefdinir, cefpodoxime, cefprozil, cefuroxime, cephalexin  and loracarbef.     CEFTAZIDIME <=1 Sensitive     CEFEPIME <=1 Sensitive     CEFTRIAXONE <=1 Sensitive     CIPROFLOXACIN <=0.25 Sensitive     LEVOFLOXACIN <=0.12 Sensitive     GENTAMICIN <=1 Sensitive     IMIPENEM <=0.25 Sensitive     NITROFURANTOIN 32 Sensitive     PIP/TAZO <=4 Sensitive     TOBRAMYCIN <=1 Sensitive     TRIMETH/SULFA* <=20 Sensitive      * For infections other than uncomplicated UTI caused by E. coli, K. pneumoniae or P. mirabilis: Cefazolin is resistant if MIC > or = 8 mcg/mL. (Distinguishing susceptible versus intermediate for isolates with MIC < or = 4 mcg/mL requires additional testing.) For uncomplicated UTI caused by E. coli, K. pneumoniae or P. mirabilis: Cefazolin is susceptible if MIC <32 mcg/mL and predicts susceptible to the oral agents cefaclor, cefdinir, cefpodoxime, cefprozil, cefuroxime, cephalexin  and loracarbef. Legend: S = Susceptible  I = Intermediate R = Resistant  NS = Not susceptible SDD = Susceptible Dose Dependent * = Not Tested  NR = Not Reported **NN = See Therapy Comments       Assessment & Plan:   Problem List Items Addressed This Visit     Essential hypertension - Primary   Relevant Medications   aspirin EC 81 MG tablet   flecainide (TAMBOCOR) 100 MG tablet   Persistent atrial fibrillation (HCC)   Relevant Medications   aspirin EC 81 MG tablet   flecainide (TAMBOCOR) 100 MG tablet   Pure hypercholesterolemia   Relevant Medications   aspirin EC 81 MG tablet   flecainide (TAMBOCOR) 100 MG tablet   Other Visit Diagnoses       Paresthesia of right lower extremity          Left nephrolithiasis             Paroxysmal Atrial Fibrillation Followed by EP and Cardiology, S/p ablations and watchman procedure Recurrent episodes managed with dual antiplatelet therapy and flecainide. Holter monitor used for episode assessment.  Plan to transition to AS NEEDED approach with flecainide. - Continue dual antiplatelet therapy with low-dose aspirin and Plavix for 4.5 months. - Continue flecainide for rhythm control. - Review Holter monitor results when available.  RLE Sciatica / Low Back Pain Ongoing sciatica with suspected lumbar nerve involvement. Lumbar spine x-ray pending to assess for nerve impingement or arthritic changes. - Order lumbar spine x-ray today. - Review x-ray results on MyChart or via call. - Consider referral to physical therapy or orthopedic specialist based on x-ray results.  Hypertension Blood pressure controlled with amlodipine . Office and home readings are reasonable. - Continue amlodipine  2.5 mg for blood pressure management. - Monitor blood pressure at home.  General Health Maintenance Mammogram due, pneumonia vaccine declined, Cologuard test up to date, advanced cholesterol panel planned. - Schedule mammogram when ready. - Decline pneumonia vaccine. - Order advanced cholesterol panel for next routine check-up in six months.      No orders of the defined types were placed in this encounter.   No orders of the defined types were placed in this encounter.   Follow up plan: Return for 6 month fasting lab > 1 week later Annual Physical.  Future labs ordered for 03/05/24 including adv lipid  panel  Domingo Friend, DO [facility] 09/04/2023, 8:36 AM

## 2023-09-11 ENCOUNTER — Ambulatory Visit: Payer: Self-pay | Admitting: Family Medicine

## 2023-09-20 ENCOUNTER — Other Ambulatory Visit: Payer: Self-pay | Admitting: Family Medicine

## 2023-09-20 DIAGNOSIS — M51362 Other intervertebral disc degeneration, lumbar region with discogenic back pain and lower extremity pain: Secondary | ICD-10-CM

## 2023-12-10 DIAGNOSIS — N3946 Mixed incontinence: Secondary | ICD-10-CM | POA: Insufficient documentation

## 2023-12-24 DIAGNOSIS — I340 Nonrheumatic mitral (valve) insufficiency: Secondary | ICD-10-CM | POA: Insufficient documentation

## 2023-12-24 DIAGNOSIS — Z8679 Personal history of other diseases of the circulatory system: Secondary | ICD-10-CM | POA: Insufficient documentation

## 2024-01-14 DIAGNOSIS — I5032 Chronic diastolic (congestive) heart failure: Secondary | ICD-10-CM | POA: Insufficient documentation

## 2024-01-16 DIAGNOSIS — Z9889 Other specified postprocedural states: Secondary | ICD-10-CM | POA: Insufficient documentation

## 2024-01-21 ENCOUNTER — Telehealth: Payer: Self-pay

## 2024-01-21 NOTE — Transitions of Care (Post Inpatient/ED Visit) (Signed)
   01/21/2024  Name: Rachel Whitehead MRN: [id number] DOB: Aug 28, 1955  Today's TOC FU Call Status: Today's TOC FU Call Status:: Successful TOC FU Call Completed TOC FU Call Complete Date: 01/21/24 Patient's Name and Date of Birth confirmed.  Transition Care Management Follow-up Telephone Call Date of Discharge: 01/18/24 Discharge Facility: Other [facility]) Name of Other (Non[facility]) Discharge Facility: [facility] Type of Discharge: Inpatient Admission Primary Inpatient Discharge Diagnosis:: afib How have you been since you were released from the hospital?: Better Any questions or concerns?: No  Items Reviewed: Did you receive and understand the discharge instructions provided?: Yes Medications obtained,verified, and reconciled?: Yes (Medications Reviewed) Any new allergies since your discharge?: No Dietary orders reviewed?: Yes Do you have support at home?: No  Medications Reviewed Today: Medications Reviewed Today     Reviewed by Emmitt Pan, LPN (Licensed Practical Nurse) on 01/21/24 at 1040  Med List Status: <None>   Medication Order Taking? Sig Documenting Provider Last Dose Status Informant  amLODipine  (NORVASC ) 2.5 MG tablet [id number] Yes Take 1 tablet (2.5 mg total) by mouth daily. Edman Marsa PARAS, DO  Active   aspirin EC 81 MG tablet [id number] Yes Take 81 mg by mouth. [provider]  Active   clopidogrel (PLAVIX) 75 MG tablet [id number] Yes Take 75 mg by mouth. [provider]  Active   flecainide (TAMBOCOR) 100 MG tablet [id number] Yes Take 100 mg by mouth. [provider]  Active   Multiple Vitamin (MULTIVITAMIN) tablet [id number] Yes Take 1 tablet by mouth daily. [provider]  Active             Home Care and Equipment/Supplies: Were Home Health Services Ordered?: NA Any new equipment or medical supplies ordered?: NA  Functional Questionnaire: Do you need assistance with bathing/showering or dressing?: No Do  you need assistance with meal preparation?: No Do you need assistance with eating?: No Do you have difficulty maintaining continence: No Do you need assistance with getting out of bed/getting out of a chair/moving?: No Do you have difficulty managing or taking your medications?: No  Follow up appointments reviewed: PCP Follow-up appointment confirmed?: NA Specialist Hospital Follow-up appointment confirmed?: Yes Date of Specialist follow-up appointment?: 02/05/24 Follow-Up Specialty Provider:: surgeon Do you need transportation to your follow-up appointment?: No Do you understand care options if your condition(s) worsen?: Yes-patient verbalized understanding    SIGNATURE Pan Emmitt, LPN [facility] Nurse Health Advisor Direct Dial [phone]

## 2024-01-24 ENCOUNTER — Other Ambulatory Visit: Payer: Self-pay

## 2024-01-24 ENCOUNTER — Emergency Department

## 2024-01-24 ENCOUNTER — Observation Stay
Admission: EM | Admit: 2024-01-24 | Discharge: 2024-01-26 | Disposition: A | Discharge status: Home / Self Care | Attending: Hospitalist | Admitting: Hospitalist

## 2024-01-24 ENCOUNTER — Ambulatory Visit: Payer: Self-pay | Admitting: *Deleted

## 2024-01-24 ENCOUNTER — Observation Stay

## 2024-01-24 DIAGNOSIS — I480 Paroxysmal atrial fibrillation: Secondary | ICD-10-CM | POA: Diagnosis not present

## 2024-01-24 DIAGNOSIS — J449 Chronic obstructive pulmonary disease, unspecified: Secondary | ICD-10-CM | POA: Diagnosis not present

## 2024-01-24 DIAGNOSIS — R29709 NIHSS score 9: Secondary | ICD-10-CM | POA: Diagnosis not present

## 2024-01-24 DIAGNOSIS — I639 Cerebral infarction, unspecified: Principal | ICD-10-CM | POA: Diagnosis present

## 2024-01-24 DIAGNOSIS — E785 Hyperlipidemia, unspecified: Secondary | ICD-10-CM | POA: Insufficient documentation

## 2024-01-24 DIAGNOSIS — I2510 Atherosclerotic heart disease of native coronary artery without angina pectoris: Secondary | ICD-10-CM | POA: Insufficient documentation

## 2024-01-24 DIAGNOSIS — I63542 Cerebral infarction due to unspecified occlusion or stenosis of left cerebellar artery: Secondary | ICD-10-CM | POA: Diagnosis present

## 2024-01-24 DIAGNOSIS — I10 Essential (primary) hypertension: Secondary | ICD-10-CM | POA: Insufficient documentation

## 2024-01-24 DIAGNOSIS — Z79899 Other long term (current) drug therapy: Secondary | ICD-10-CM | POA: Insufficient documentation

## 2024-01-24 DIAGNOSIS — Z7982 Long term (current) use of aspirin: Secondary | ICD-10-CM | POA: Insufficient documentation

## 2024-01-24 LAB — CBC
HCT: 40.2 % (ref 36.0–46.0)
Hemoglobin: 13.1 g/dL (ref 12.0–15.0)
MCH: 29.5 pg (ref 26.0–34.0)
MCHC: 32.6 g/dL (ref 30.0–36.0)
MCV: 90.5 fL (ref 80.0–100.0)
Platelets: 375 K/uL (ref 150–400)
RBC: 4.44 MIL/uL (ref 3.87–5.11)
RDW: 13.2 % (ref 11.5–15.5)
WBC: 6.8 K/uL (ref 4.0–10.5)
nRBC: 0 % (ref 0.0–0.2)

## 2024-01-24 LAB — DIFFERENTIAL
Abs Immature Granulocytes: 0.03 K/uL (ref 0.00–0.07)
Basophils Absolute: 0 K/uL (ref 0.0–0.1)
Basophils Relative: 1 %
Eosinophils Absolute: 0.2 K/uL (ref 0.0–0.5)
Eosinophils Relative: 3 %
Immature Granulocytes: 0 %
Lymphocytes Relative: 25 %
Lymphs Abs: 1.7 K/uL (ref 0.7–4.0)
Monocytes Absolute: 0.5 K/uL (ref 0.1–1.0)
Monocytes Relative: 8 %
Neutro Abs: 4.3 K/uL (ref 1.7–7.7)
Neutrophils Relative %: 63 %

## 2024-01-24 LAB — COMPREHENSIVE METABOLIC PANEL WITH GFR
ALT: 17 U/L (ref 0–44)
AST: 29 U/L (ref 15–41)
Albumin: 3.8 g/dL (ref 3.5–5.0)
Alkaline Phosphatase: 69 U/L (ref 38–126)
Anion gap: 13 (ref 5–15)
BUN: 22 mg/dL (ref 8–23)
CO2: 20 mmol/L — ABNORMAL LOW (ref 22–32)
Calcium: 8.8 mg/dL — ABNORMAL LOW (ref 8.9–10.3)
Chloride: 101 mmol/L (ref 98–111)
Creatinine, Ser: 0.83 mg/dL (ref 0.44–1.00)
GFR, Estimated: >60 mL/min (ref >60)
Glucose, Bld: 92 mg/dL (ref 70–99)
Potassium: 4.6 mmol/L (ref 3.5–5.1)
Sodium: 134 mmol/L — ABNORMAL LOW (ref 135–145)
Total Bilirubin: 2 mg/dL — ABNORMAL HIGH (ref 0.0–1.2)
Total Protein: 7.7 g/dL (ref 6.5–8.1)

## 2024-01-24 LAB — ETHANOL: Alcohol, Ethyl (B): <15 mg/dL (ref <15)

## 2024-01-24 LAB — PROTIME-INR
INR: 1 (ref 0.8–1.2)
Prothrombin Time: 13.3 s (ref 11.4–15.2)

## 2024-01-24 LAB — CBG MONITORING, ED: Glucose-Capillary: 88 mg/dL (ref 70–99)

## 2024-01-24 LAB — HEMOGLOBIN A1C
Hgb A1c MFr Bld: 5 % (ref 4.8–5.6)
Mean Plasma Glucose: 96.8 mg/dL

## 2024-01-24 LAB — APTT: aPTT: 31 s (ref 24–36)

## 2024-01-24 MED ORDER — STROKE: EARLY STAGES OF RECOVERY BOOK: Freq: Once | Status: AC

## 2024-01-24 MED ORDER — CLOPIDOGREL BISULFATE 75 MG PO TABS: 75.0000 mg | ORAL_TABLET | Freq: Every day | ORAL | Status: DC

## 2024-01-24 MED ORDER — LABETALOL HCL 5 MG/ML IV SOLN
10.0000 mg | INTRAVENOUS | Status: DC | PRN
Start: 2024-01-24 — End: 2024-01-26

## 2024-01-24 MED ORDER — CLOPIDOGREL BISULFATE 75 MG PO TABS
75.0000 mg | ORAL_TABLET | Freq: Every day | ORAL | Status: DC
  Administered 2024-01-25 – 2024-01-26 (×2): 75 mg via ORAL
  Filled 2024-01-24 (×2): qty 1

## 2024-01-24 MED ORDER — SENNOSIDES-DOCUSATE SODIUM 8.6-50 MG PO TABS: 1.0000 | ORAL_TABLET | Freq: Every evening | ORAL | Status: DC | PRN

## 2024-01-24 MED ORDER — ACETAMINOPHEN 325 MG PO TABS: 975.0000 mg | ORAL_TABLET | Freq: Four times a day (QID) | ORAL | Status: DC | PRN

## 2024-01-24 MED ORDER — LORAZEPAM 0.5 MG PO TABS
0.5000 mg | ORAL_TABLET | Freq: Four times a day (QID) | ORAL | Status: DC | PRN
  Administered 2024-01-25: 0.5 mg via ORAL
  Filled 2024-01-24 (×2): qty 1

## 2024-01-24 MED ORDER — ASPIRIN 81 MG PO TBEC
81.0000 mg | DELAYED_RELEASE_TABLET | Freq: Every day | ORAL | Status: DC
  Administered 2024-01-25 – 2024-01-26 (×2): 81 mg via ORAL
  Filled 2024-01-24 (×2): qty 1

## 2024-01-24 MED ORDER — METOPROLOL SUCCINATE ER 25 MG PO TB24
12.5000 mg | ORAL_TABLET | Freq: Every day | ORAL | Status: DC
  Administered 2024-01-26: 12.5 mg via ORAL
  Filled 2024-01-24 (×2): qty 1

## 2024-01-24 MED ORDER — ASPIRIN 81 MG PO CHEW
324.0000 mg | CHEWABLE_TABLET | Freq: Once | ORAL | Status: AC
  Administered 2024-01-24: 324 mg via ORAL
  Filled 2024-01-24: qty 4

## 2024-01-24 MED ORDER — FLECAINIDE ACETATE 100 MG PO TABS
100.0000 mg | ORAL_TABLET | Freq: Two times a day (BID) | ORAL | Status: DC
  Administered 2024-01-24 – 2024-01-26 (×4): 100 mg via ORAL
  Filled 2024-01-24 (×5): qty 1

## 2024-01-24 MED ORDER — IOHEXOL 350 MG/ML SOLN
75.0000 mL | Freq: Once | INTRAVENOUS | Status: AC | PRN
  Administered 2024-01-24: 75 mL via INTRAVENOUS

## 2024-01-24 MED ORDER — SODIUM CHLORIDE 0.9% FLUSH
3.0000 mL | Freq: Once | INTRAVENOUS | Status: AC
  Administered 2024-01-24: 3 mL via INTRAVENOUS

## 2024-01-24 MED ORDER — SODIUM CHLORIDE 0.9 % IV SOLN: INTRAVENOUS | Status: DC

## 2024-01-24 MED ORDER — ASPIRIN 81 MG PO TBEC: 81.0000 mg | DELAYED_RELEASE_TABLET | Freq: Every day | ORAL | Status: DC

## 2024-01-24 MED ORDER — CLOPIDOGREL BISULFATE 75 MG PO TABS
75.0000 mg | ORAL_TABLET | ORAL | Status: AC
  Administered 2024-01-24: 75 mg via ORAL
  Filled 2024-01-24: qty 1

## 2024-01-24 MED ORDER — FUROSEMIDE 20 MG PO TABS
20.0000 mg | ORAL_TABLET | Freq: Every day | ORAL | Status: DC
  Administered 2024-01-26: 20 mg via ORAL
  Filled 2024-01-24 (×2): qty 1

## 2024-01-24 MED ORDER — ENOXAPARIN SODIUM 40 MG/0.4ML IJ SOSY
40.0000 mg | PREFILLED_SYRINGE | INTRAMUSCULAR | Status: DC
Start: 2024-01-24 — End: 2024-01-26
  Administered 2024-01-24 – 2024-01-25 (×2): 40 mg via SUBCUTANEOUS
  Filled 2024-01-24 (×2): qty 0.4

## 2024-01-24 NOTE — ED Notes (Signed)
 MD at bedside.

## 2024-01-24 NOTE — ED Notes (Signed)
 Pt to room, neurologist at bedside discussing TNK administration with pt and pts son

## 2024-01-24 NOTE — ED Triage Notes (Addendum)
 Approx 0930 pt noted that she had difficulty walking to the bathroom the right leg wasn't working right ad she felt wobbily, pt is unable to hold the right leg up in triage, pt has decreased sensation to the right lower leg  Cbg 88

## 2024-01-24 NOTE — Plan of Care (Signed)
  Problem: Education: Goal: Knowledge of disease or condition will improve Outcome: Progressing   Problem: Ischemic Stroke/TIA Tissue Perfusion: Goal: Complications of ischemic stroke/TIA will be minimized Outcome: Progressing   Problem: Coping: Goal: Will verbalize positive feelings about self Outcome: Progressing   Problem: Health Behavior/Discharge Planning: Goal: Ability to manage health-related needs will improve Outcome: Progressing   Problem: Self-Care: Goal: Ability to participate in self-care as condition permits will improve Outcome: Progressing   Problem: Nutrition: Goal: Risk of aspiration will decrease Outcome: Progressing   Problem: Clinical Measurements: Goal: Ability to maintain clinical measurements within normal limits will improve Outcome: Progressing

## 2024-01-24 NOTE — ED Notes (Signed)
 1130-Pt wants to discuss TNK with her family prior to consenting to medication  1149- Pt spoke with son with Dr. Matthews on the phone. Pt wants to wait on her son to get to the hospital before she makes decision on TNK.  1151- Pt having improvement in her symptoms. Pt is now able to move her right leg, pt can lift her right leg off the bed now.   Primary RN Hospital doctor informed to continue to do Q 30 minute NIH stroke scale until pt is out of TNK window (1400) per Dr. Matthews.

## 2024-01-24 NOTE — Plan of Care (Signed)
  Problem: Education: Goal: Knowledge of disease or condition will improve Outcome: Progressing   Problem: Ischemic Stroke/TIA Tissue Perfusion: Goal: Complications of ischemic stroke/TIA will be minimized Outcome: Progressing   Problem: Coping: Goal: Will verbalize positive feelings about self Outcome: Progressing   Problem: Health Behavior/Discharge Planning: Goal: Ability to manage health-related needs will improve Outcome: Progressing   Problem: Self-Care: Goal: Ability to participate in self-care as condition permits will improve Outcome: Progressing   Problem: Nutrition: Goal: Risk of aspiration will decrease Outcome: Progressing   Problem: Clinical Measurements: Goal: Ability to maintain clinical measurements within normal limits will improve Outcome: Progressing   Problem: Safety: Goal: Ability to remain free from injury will improve Outcome: Progressing   Problem: Skin Integrity: Goal: Risk for impaired skin integrity will decrease Outcome: Progressing

## 2024-01-24 NOTE — Progress Notes (Signed)
  Chaplain On-Call responded to Code Stroke notification at 1108.  The patient was at the CT Scan area for tests.  Chaplain assured Staff of availability as needed.  Chaplain Bebe Ardean EMERSON Hershal., [facility]

## 2024-01-24 NOTE — ED Notes (Signed)
 Code stoke called to [facility]

## 2024-01-24 NOTE — Progress Notes (Signed)
 CODE STROKE- PHARMACY COMMUNICATION   Time CODE STROKE called/page received: 11:13  Time response to CODE STROKE was made (in person or via phone): 11:20, in person  Time Stroke Kit retrieved from Pyxis (only if needed): 11:13.  Initial plan was to administer tenecteplase (TNK) to the patient. Based on the patient's weight (80.1 kg), a TNK dose of 20 mg (4 mL) was prepared for administration. However, due to improvement in symptoms and right leg movement, the patient declined TNK administration and would just monitor until out of window for administration. The prepared dose will be returned to the pharmacy.  Name of Provider/Nurse contacted: Dr. Matthews  Past Medical History:  Diagnosis Date   Hypertension    Urinary incontinence    Prior to Admission medications   Medication Sig Start Date End Date Taking? Authorizing Provider  acetaminophen  (TYLENOL ) 325 MG tablet Take 975 mg by mouth every 6 (six) hours as needed for mild pain (pain score 1-3). 01/18/24  Yes [provider]  furosemide  (LASIX ) 20 MG tablet Take 20 mg by mouth daily. 01/18/24 01/25/24 Yes [provider]  metoprolol succinate (TOPROL-XL) 25 MG 24 hr tablet Take 12.5 mg by mouth daily. 01/18/24 02/17/24 Yes [provider]  potassium chloride (KLOR-CON) 10 MEQ tablet Take 10 mEq by mouth daily. 01/18/24 01/25/24 Yes [provider]  amLODipine  (NORVASC ) 2.5 MG tablet Take 1 tablet (2.5 mg total) by mouth daily. 03/05/23   Karamalegos, Marsa PARAS, DO  aspirin EC 81 MG tablet Take 81 mg by mouth. 08/15/23 08/14/24  [provider]  clopidogrel (PLAVIX) 75 MG tablet Take 75 mg by mouth. 08/15/23 08/14/24  [provider]  flecainide (TAMBOCOR) 100 MG tablet Take 100 mg by mouth. 06/28/23 06/27/24  [provider]  Multiple Vitamin (MULTIVITAMIN) tablet Take 1 tablet by mouth daily.    [provider]    Ransom Blanch PGY-1 Pharmacy Resident  [place] -  [facility]  01/24/2024 5:05 PM

## 2024-01-24 NOTE — H&P (Addendum)
 History and Physical    Twilia Yaklin FMW:[id number] DOB: 24-Dec-1955 DOA: 01/24/2024  PCP: Edman Marsa PARAS, DO (Confirm with patient/family/NH records and if not entered, this has to be entered at [facility] point of entry) Patient coming from: Home  I have personally briefly reviewed patient's old medical records in [facility]  Chief Complaint: Right leg weakness  HPI: Andres Escandon is a 68 y.o. female with medical history significant of PAF status post cardioversion and recent ablation, status post Maze procedure, poor tolerance to anticoagulation due to recurrent severe hematuria, previous stroke  left eye, HTN, presented with acute onset of right leg weakness.  Symptoms started this morning around 9:30 AM, patient started develop sudden onset of numbness and weakness of the right lower extremity  could not stand on right foot.  Came to ED, CT head showed acute/subacute left cerebellar.  Patient was offered TNK therapy however she declined after knowing about side effects.  Right leg weakness significantly improved during ED stay.  Review of Systems: As per HPI otherwise 14 point review of systems negative.    Past Medical History:  Diagnosis Date   Hypertension    Urinary incontinence     Past Surgical History:  Procedure Laterality Date   CARDIOVERSION N/A 02/14/2022   Procedure: CARDIOVERSION;  Surgeon: Ammon Marsa, MD;  Location: [facility] ORS;  Service: Cardiovascular;  Laterality: N/A;   CARDIOVERSION N/A 02/28/2022   Procedure: CARDIOVERSION;  Surgeon: Ammon Marsa, MD;  Location: [facility] ORS;  Service: Cardiovascular;  Laterality: N/A;   CARDIOVERSION N/A 03/30/2022   Procedure: CARDIOVERSION;  Surgeon: Ammon Marsa, MD;  Location: [facility] ORS;  Service: Cardiovascular;  Laterality: N/A;   CARDIOVERSION N/A 10/19/2022   Procedure: CARDIOVERSION;  Surgeon: Dewane Shiner, DO;  Location: [facility] ORS;  Service: Cardiovascular;  Laterality: N/A;    STRABISMUS SURGERY Left    1961     reports that she has never smoked. She does not have any smokeless tobacco history on file. She reports current alcohol use of about 4.0 standard drinks of alcohol per week. She reports that she does not use drugs.  No Known Allergies  Family History  Problem Relation Age of Onset   Cancer Father    Breast cancer Sister 62     Prior to Admission medications   Medication Sig Start Date End Date Taking? Authorizing Provider  acetaminophen  (TYLENOL ) 325 MG tablet Take 975 mg by mouth every 6 (six) hours as needed for mild pain (pain score 1-3). 01/18/24  Yes [provider]  furosemide  (LASIX ) 20 MG tablet Take 20 mg by mouth daily. 01/18/24 01/25/24 Yes [provider]  metoprolol succinate (TOPROL-XL) 25 MG 24 hr tablet Take 12.5 mg by mouth daily. 01/18/24 02/17/24 Yes [provider]  potassium chloride (KLOR-CON) 10 MEQ tablet Take 10 mEq by mouth daily. 01/18/24 01/25/24 Yes [provider]  amLODipine  (NORVASC ) 2.5 MG tablet Take 1 tablet (2.5 mg total) by mouth daily. 03/05/23   Karamalegos, Marsa PARAS, DO  aspirin EC 81 MG tablet Take 81 mg by mouth. 08/15/23 08/14/24  [provider]  clopidogrel (PLAVIX) 75 MG tablet Take 75 mg by mouth. 08/15/23 08/14/24  [provider]  flecainide (TAMBOCOR) 100 MG tablet Take 100 mg by mouth. 06/28/23 06/27/24  [provider]  Multiple Vitamin (MULTIVITAMIN) tablet Take 1 tablet by mouth daily.    [provider]    Physical Exam: Vitals:   01/24/24 1150 01/24/24 1153 01/24/24 1300 01/24/24 1330  BP: ROLLEN)  185/71  127/87 139/73  Pulse: 67  (!) 54 (!) 53  Resp: 20  20 17   Temp: 97.9 F (36.6 C)     TempSrc: Oral     SpO2: 100%  100% 100%  Weight:  80.1 kg    Height:  5' 4 (1.626 m)      Constitutional: NAD, calm, comfortable Vitals:   01/24/24 1150 01/24/24 1153 01/24/24 1300 01/24/24 1330  BP: (!) 185/71  127/87 139/73   Pulse: 67  (!) 54 (!) 53  Resp: 20  20 17   Temp: 97.9 F (36.6 C)     TempSrc: Oral     SpO2: 100%  100% 100%  Weight:  80.1 kg    Height:  5' 4 (1.626 m)     Eyes: PERRL, lids and conjunctivae normal ENMT: Mucous membranes are moist. Posterior pharynx clear of any exudate or lesions.Normal dentition.  Neck: normal, supple, no masses, no thyromegaly Respiratory: clear to auscultation bilaterally, no wheezing, no crackles. Normal respiratory effort. No accessory muscle use.  Cardiovascular: Regular rate and rhythm, no murmurs / rubs / gallops. No extremity edema. 2+ pedal pulses. No carotid bruits.  Abdomen: no tenderness, no masses palpated. No hepatosplenomegaly. Bowel sounds positive.  Musculoskeletal: no clubbing / cyanosis. No joint deformity upper and lower extremities. Good ROM, no contractures. Normal muscle tone.  Skin: no rashes, lesions, ulcers. No induration Neurologic: CN 2-12 grossly intact. Sensation intact, DTR normal. Strength 3/5 in right lower extremity versus 5/5 on left side Psychiatric: Normal judgment and insight. Alert and oriented x 3. Normal mood.     Labs on Admission: I have personally reviewed following labs and imaging studies  CBC: Recent Labs  Lab 01/24/24 1143  WBC 6.8  NEUTROABS 4.3  HGB 13.1  HCT 40.2  MCV 90.5  PLT 375   Basic Metabolic Panel: Recent Labs  Lab 01/24/24 1143  NA 134*  K 4.6  CL 101  CO2 20*  GLUCOSE 92  BUN 22  CREATININE 0.83  CALCIUM 8.8*   GFR: Estimated Creatinine Clearance: 64.6 mL/min (by C-G formula based on SCr of 0.83 mg/dL). Liver Function Tests: Recent Labs  Lab 01/24/24 1143  AST 29  ALT 17  ALKPHOS 69  BILITOT 2.0*  PROT 7.7  ALBUMIN 3.8   No results for input(s): LIPASE, AMYLASE in the last 168 hours. No results for input(s): AMMONIA in the last 168 hours. Coagulation Profile: Recent Labs  Lab 01/24/24 1143  INR 1.0   Cardiac Enzymes: No results for input(s): CKTOTAL,  CKMB, CKMBINDEX, TROPONINI in the last 168 hours. BNP (last 3 results) No results for input(s): PROBNP in the last 8760 hours. HbA1C: No results for input(s): HGBA1C in the last 72 hours. CBG: Recent Labs  Lab 01/24/24 1100  GLUCAP 88   Lipid Profile: No results for input(s): CHOL, HDL, LDLCALC, TRIG, CHOLHDL, LDLDIRECT in the last 72 hours. Thyroid Function Tests: No results for input(s): TSH, T4TOTAL, FREET4, T3FREE, THYROIDAB in the last 72 hours. Anemia Panel: No results for input(s): VITAMINB12, FOLATE, FERRITIN, TIBC, IRON, RETICCTPCT in the last 72 hours. Urine analysis:    Component Value Date/Time   BILIRUBINUR negative 05/09/2023 1346   PROTEINUR Negative 05/09/2023 1346   UROBILINOGEN 0.2 05/09/2023 1346   NITRITE negative 05/09/2023 1346   LEUKOCYTESUR Moderate (2+) (A) 05/09/2023 1346    Radiological Exams on Admission: CT ANGIO HEAD NECK W WO CM (CODE STROKE) Result Date: 01/24/2024 EXAM: CTA HEAD AND NECK WITHOUT AND WITH 01/24/2024  11:31:08 AM TECHNIQUE: CTA of the head and neck was performed without and with the administration of 75 mL of iohexol  (OMNIPAQUE ) 350 MG/ML injection. Multiplanar 2D and/or 3D reformatted images are provided for review. Automated exposure control, iterative reconstruction, and/or weight based adjustment of the mA/kV was utilized to reduce the radiation dose to as low as reasonably achievable. Stenosis of the internal carotid arteries measured using NASCET criteria. COMPARISON: Head CT reported separately today. Brain MRI 10/30/2007. CLINICAL HISTORY: 68 year old female. Neuro deficit, acute, stroke suspected; RLE acute paralysis c/f L ACA acute infarct. FINDINGS: CTA NECK: AORTIC ARCH AND ARCH VESSELS: Normal 3 vessel arch. Mild aortic arch tortuosity. No dissection or arterial injury. No significant stenosis of the brachiocephalic or subclavian arteries. Mildly tortuous proximal subclavian arteries  with no plaque or stenosis. CERVICAL CAROTID ARTERIES: Bilateral cervical carotid arteries are mildly to moderately tortuous. No atherosclerosis or stenosis associated. Ectatic appearance of the distal ICAs without beading or irregularity of the vessel walls (series 7, image 136). No dissection or arterial injury. CERVICAL VERTEBRAL ARTERIES: Bilateral vertebral artery origins are mildly tortuous with no plaque or stenosis. Dominant right vertebral artery is widely patent and mildly ectatic to the skull base. Nondominant left vertebral artery is normal to the skull base. Normal right PICA origin. No dissection or arterial injury. LUNGS AND MEDIASTINUM: Unremarkable. SOFT TISSUES: No acute abnormality. BONES: Mild for age spine degeneration. No acute abnormality. CTA HEAD: ANTERIOR CIRCULATION: Both ICA siphons are ectatic, more so the right, with no plaque or stenosis. Normal right posterior communicating artery origin. Normal MCA and ACA origin. Diminutive or absent anterior communicating artery. Bilateral ACA branches are within normal limits. Tortuous MCA M1 segments. Patent MCA bifurcations without stenosis. Right MCA branches are within normal limits. Left MCA bifurcation or trifurcation is positive for a tiny saccular aneurysm directed laterally, 1 to 2 mm in size, best seen on series 8, image 140. Left MCA branches otherwise within normal limits. POSTERIOR CIRCULATION: The distal left vertebral artery terminates in PICA. The right vertebral artery supplies the basilar artery. Tortuous distal right vertebral artery without stenosis. Tortuous basilar artery without stenosis. Normal SCA and left PCA origins. Fetal right PCA origin. Diminutive or absent left posterior communicating artery. No significant stenosis of the posterior cerebral arteries. No significant stenosis of the vertebral arteries. No aneurysm. OTHER: Early intracranial venous enhancement. Superior sagittal sinus is patent. No dural venous sinus  thrombosis on this non-dedicated study. . These results were communicated to Dr. Matthews at 1139 hours on 01/24/2024 by text page via the [facility] messaging system. IMPRESSION: 1. No large vessel occlusion. Generalized arterial ectasia in the head and neck. Minimal atherosclerosis. No stenosis 2. 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 3. These results were communicated to Dr. Matthews at 11:39 hours on 01/24/2024 by text page via the [facility] messaging system. Electronically signed by: Helayne Hurst MD 01/24/2024 11:41 AM EDT RP Workstation: [id number]   CT HEAD CODE STROKE WO CONTRAST Result Date: 01/24/2024 EXAM: CT HEAD WITHOUT CONTRAST 01/24/2024 11:09:32 AM TECHNIQUE: CT of the head was performed without the administration of intravenous contrast. Automated exposure control, iterative reconstruction, and/or weight based adjustment of the mA/kV was utilized to reduce the radiation dose to as low as reasonably achievable. COMPARISON: Head CT and MRI 10/29/2017. CLINICAL HISTORY: Neuro deficit, acute, stroke suspected. Right leg numbness and weakness. Code stroke. FINDINGS: BRAIN AND VENTRICLES: A 1 cm hypodensity in the left cerebellar hemisphere is new and suspicious for  an acute or subacute infarct. No acute supratentorial infarct, intracranial hemorrhage, mass, midline shift, hydrocephalus, or extra-axial fluid collection is identified. No age-advanced cerebral atrophy is evident. Encephalomalacia in the posteroinferior right temporal lobe is unchanged. Patchy hypodensity elsewhere in the cerebral white matter bilaterally have progressed and are nonspecific but compatible with moderate chronic small vessel ischemic disease. No hyperdense vessel. ORBITS: No acute abnormality. SINUSES: No acute abnormality. SOFT TISSUES AND SKULL: No acute soft tissue abnormality. No skull fracture. [place] Stroke Program Early CT Score (ASPECTS) ----- Ganglionic (caudate, IC, lentiform nucleus, insula, M1-M3): 7  Supraganglionic (M4-M6): 3 Total: 10 IMPRESSION: 1. Small acute/subacute left cerebellar infarct. 2. ASPECTS of 10. No intracranial hemorrhage. 3. Moderate chronic small vessel ischemic disease. 4. These results were communicated to Dr. Matthews at 11:18 AM on 01/24/2024 by secure text page via the [facility] messaging system. Electronically signed by: Dasie Hamburg MD 01/24/2024 11:36 AM EDT RP Workstation: [id number]    EKG: Independently reviewed.  Sinus rhythm, no acute ST changes.  Assessment/Plan Principal Problem:   Stroke [facility]) Active Problems:   Stroke (cerebrum) (HCC)  (please populate well all problems here in Problem List. (For example, if patient is on BP meds at home and you resume or decide to hold them, it is a problem that needs to be her. Same for CAD, COPD, HLD and so on)  Acute right lower extremity paresis Acute/subacute left cerebellar infarct - Aspirin and Plavix - PT evaluation - Allow permissive hypertension, hold off home BP meds, start as needed labetalol - MRI  PAF status post recent ablation - No acute concerns - Continue beta-blocker-, continue flecainide - History of poor tolerance to anticoagulation due to recurrent severe hematuria, s/p Maze procedure this year.  HTN - Hold off home BP meds - As needed labetalol  Total time spent on patient care 75 minutes.  DVT prophylaxis: Lovenox Code Status: Full code Family Communication: ED physician and neurology talk to patient family over the phone earlier Disposition Plan: Expect less than 2 midnight hospital stay Consults called: Neurology Admission status: Maitri observation   Cort ONEIDA Mana MD [facility] Pager [phone]  01/24/2024, 2:47 PM

## 2024-01-24 NOTE — Telephone Encounter (Signed)
 911 notified   FYI Only or Action Required?: FYI only for provider.  Patient was last seen in primary care on 09/04/2023 by Edman Marsa PARAS, DO.  Called Nurse Triage reporting Neurologic Problem.  Symptoms began today.  Interventions attempted: Nothing.  Symptoms are: rapidly worsening.  Triage Disposition: Call EMS 911 Now  Patient/caregiver understands and will follow disposition?: Yes     Copied from CRM #[id number]. Topic: Clinical - Red Word Triage >> Jan 24, 2024  9:38 AM Harlene ORN wrote: Red Word that prompted transfer to Nurse Triage: just got surgery done on an conversion ablasion on the heart on 10/14 In the 5-10 now, strange symptoms in her legs; cannot stand on her right leg Reason for Disposition  [1] SEVERE weakness (e.g., unable to walk or barely able to walk, requires support) AND [2] new-onset or getting worse  Answer Assessment - Initial Assessment Questions Contacted 911. EMS arrived on scene.     1. SYMPTOM: What is the main symptom you are concerned about? (e.g., weakness, numbness)     Weakness right leg unable to stand weakness  2. ONSET: When did this start? (e.g., minutes, hours, days; while sleeping)     15 minutes  3. LAST NORMAL: When was the last time you (the patient) were normal (no symptoms)?     15 minutes ago  4. PATTERN Does this come and go, or has it been constant since it started?  Is it present now?     Present  5. CARDIAC SYMPTOMS: Have you had any of the following symptoms: chest pain, difficulty breathing, palpitations?     No  6. NEUROLOGIC SYMPTOMS: Have you had any of the following symptoms: headache, dizziness, vision loss, double vision, changes in speech, unsteady on your feet?     Dizziness , can not lift or stand on right leg. Has taken medication today .  7. OTHER SYMPTOMS: Do you have any other symptoms?     No  8. PREGNANCY: Is there any chance you are pregnant? When was your last menstrual  period?     na  Protocols used: Neurologic Deficit-A-AH

## 2024-01-24 NOTE — ED Notes (Signed)
Pt assisted with use of bedpan

## 2024-01-24 NOTE — Consult Note (Signed)
 NEUROLOGY CONSULT NOTE   Date of service: January 24, 2024 Patient Name: Rachel Whitehead MRN:  [id number] DOB:  01-Mar-1956 Chief Complaint: RLE weakness acute onset Requesting Provider: Dicky Anes, MD  History of Present Illness  Rachel Whitehead is a 68 y.o. 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  Last known well 9:30 AM after which all of a sudden her right leg became numb and she was unable to move her right leg at all.  On exam she has left lateral visual field deficit in her left eye only, right nasolabial fold flattening (chronic per son), no movement right lower extremity, sensory deficit right lower extremity.  Distal pulses intact in bilateral lower extremities.  CT head on personal review showed small acute/subacute left cerebellar infarct.  When asked specifically about dizziness she said that she felt the room spinning for a couple minutes after 9:30 AM today but had never had this sensation before and is now resolved.  CTA showed no LVO. Risks, benefits, and alternatives to TNK were discussed.  I explained specifically that she would be at a slightly greater increased risk of bleeding complications from TNK due to her recurrent hematuria however based on chart review and discussion of symptoms patient does not have any findings concerning for current kidney stones or hematuria.  She was not sure whether she wanted to take the medication and preferred to wait for her son to arrive to the hospital.  Subsequently her symptoms began to rapidly improve and within 15 minutes she was able to lift RLE and hold it without drift. Due to rapidly improving symptoms at that time I recommended that we not administer TNK and  continue to monitor q 30 min until she was outside of the TNK window.   LKW: 0930 Modified rankin score: 1-No significant post stroke disability and can perform usual duties with stroke symptoms IV Thrombolysis: no, rapidly improving symptoms EVT: no, no LVO  NIHSS components Score: Comment  1a Level of Conscious 0[x]  1[]  2[]  3[]      1b LOC Questions 0[x]  1[]  2[]       1c LOC Commands 0[x]  1[]  2[]       2 Best Gaze 0[x]  1[]  2[]       3 Visual 0[]  1[]  2[x]  3[]      4 Facial Palsy 0[]  1[x]  2[]  3[]      5a Motor Arm - left 0[x]  1[]  2[]  3[]  4[]  UN[]    5b Motor Arm - Right 0[x]  1[]  2[]  3[]  4[]  UN[]    6a Motor Leg - Left 0[x]  1[]  2[]  3[]  4[]  UN[]    6b Motor Leg - Right 0[]  1[]  2[]  3[]  4[x]  UN[]    7 Limb Ataxia 0[x]  1[]  2[]  UN[]      8 Sensory 0[]  1[x]  2[]  UN[]      9 Best Language 0[x]  1[]  2[]  3[]      10 Dysarthria 0[]  1[x]  2[]  UN[]      11 Extinct. and Inattention 0[x]  1[]  2[]       TOTAL:  9      ROS   Comprehensive ROS performed and pertinent positives documented in HPI   Past History   Past Medical History:  Diagnosis Date   Hypertension    Urinary incontinence  Past Surgical History:  Procedure Laterality Date   CARDIOVERSION N/A 02/14/2022   Procedure: CARDIOVERSION;  Surgeon: Ammon Blunt, MD;  Location: [facility];  Service: Cardiovascular;  Laterality: N/A;   CARDIOVERSION N/A 02/28/2022   Procedure: CARDIOVERSION;  Surgeon: Ammon Blunt, MD;  Location: [facility];  Service: Cardiovascular;  Laterality: N/A;   CARDIOVERSION N/A 03/30/2022   Procedure: CARDIOVERSION;  Surgeon: Ammon Blunt, MD;  Location: [facility];  Service: Cardiovascular;  Laterality: N/A;   CARDIOVERSION N/A 10/19/2022   Procedure: CARDIOVERSION;  Surgeon: Dewane Shiner, DO;  Location: [facility];  Service: Cardiovascular;  Laterality: N/A;   STRABISMUS SURGERY Left    1961    Family History: Family History  Problem Relation Age of Onset   Cancer Father    Breast cancer  Sister 84    Social History  reports that she has never smoked. She does not have any smokeless tobacco history on file. She reports current alcohol use of about 4.0 standard drinks of alcohol per week. She reports that she does not use drugs.  No Known Allergies  Medications   Current Facility-Administered Medications:    aspirin chewable tablet 324 mg, 324 mg, Oral, Once, Quale, Mark, MD   clopidogrel (PLAVIX) tablet 75 mg, 75 mg, Oral, STAT, Quale, Mark, MD   sodium chloride  flush (NS) 0.9 % injection 3 mL, 3 mL, Intravenous, Once, Quale, Mark, MD  Current Outpatient Medications:    acetaminophen  (TYLENOL ) 325 MG tablet, Take 975 mg by mouth every 6 (six) hours as needed for mild pain (pain score 1-3)., Disp: , Rfl:    furosemide  (LASIX ) 20 MG tablet, Take 20 mg by mouth daily., Disp: , Rfl:    metoprolol succinate (TOPROL-XL) 25 MG 24 hr tablet, Take 12.5 mg by mouth daily., Disp: , Rfl:    potassium chloride (KLOR-CON) 10 MEQ tablet, Take 10 mEq by mouth daily., Disp: , Rfl:    amLODipine  (NORVASC ) 2.5 MG tablet, Take 1 tablet (2.5 mg total) by mouth daily., Disp: 90 tablet, Rfl: 3   aspirin EC 81 MG tablet, Take 81 mg by mouth., Disp: , Rfl:    clopidogrel (PLAVIX) 75 MG tablet, Take 75 mg by mouth., Disp: , Rfl:    flecainide (TAMBOCOR) 100 MG tablet, Take 100 mg by mouth., Disp: , Rfl:    Multiple Vitamin (MULTIVITAMIN) tablet, Take 1 tablet by mouth daily., Disp: , Rfl:   Vitals   Vitals:   23-Feb-2024 1150 02-23-2024 1153 02/23/24 1300 02-23-24 1330  BP: (!) 185/71  127/87 139/73  Pulse: 67  (!) 54 (!) 53  Resp: 20  20 17   Temp: 97.9 F (36.6 C)     TempSrc: Oral     SpO2: 100%  100% 100%  Weight:  80.1 kg    Height:  5' 4 (1.626 m)      Body mass index is 30.31 kg/m.   Physical Exam   Gen: patient lying in bed, NAD CV: extremities appear well-perfused Resp: normal WOB  Neurologic exam MS: alert, oriented x4, follows commands Speech: minimal dysarthria, no  aphasia CN: PERRL, L lateral hemifield deficit L eye only, EOMI, sensation intact, R NLF flattening, hearing intact to voice Motor: 5/5 strength throughout with no drift, BUE and LLE. No movement RLE Sensory: Sensory impairment RLE Coordination: FNF intact bilat, HTS intact on L Gait: deferred  Labs/Imaging/Neurodiagnostic studies   CBC:  Recent Labs  Lab 02-23-2024 1143  WBC 6.8  NEUTROABS 4.3  HGB 13.1  HCT 40.2  MCV 90.5  PLT 375   Basic Metabolic Panel:  Lab Results  Component Value Date   NA 134 (L) 01/24/2024   K 4.6 01/24/2024   CO2 20 (L) 01/24/2024   GLUCOSE 92 01/24/2024   BUN 22 01/24/2024   CREATININE 0.83 01/24/2024   CALCIUM 8.8 (L) 01/24/2024   GFRNONAA >60 01/24/2024   GFRAA >60 10/29/2017   Lipid Panel:  Lab Results  Component Value Date   LDLCALC 133 (H) 02/26/2023   HgbA1c:  Lab Results  Component Value Date   HGBA1C 5.2 02/26/2023   Urine Drug Screen: No results found for: LABOPIA, COCAINSCRNUR, LABBENZ, AMPHETMU, THCU, LABBARB  Alcohol Level     Component Value Date/Time   [facility] <15 01/24/2024 1143   INR  Lab Results  Component Value Date   INR 1.0 01/24/2024   APTT  Lab Results  Component Value Date   APTT 31 01/24/2024   AED levels: No results found for: PHENYTOIN, ZONISAMIDE, LAMOTRIGINE, LEVETIRACETA  CT Head without contrast(Personally reviewed): Acute/subacute L cerebellar infarct  CT angio Head and Neck with contrast(Personally reviewed): No LVO  ASSESSMENT   Rachel Whitehead is a 68 y.o. 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 Initially NIHSS = 9 with no movement RLE however symptoms began to rapidly  improve therefore TNK was not administered at that time. CTA H&N no LVO. Patient was monitored with VS and NIHSS q 30 min until she was outside the TNK window and her symptoms continued to improve but have not resolved.  RECOMMENDATIONS   - Admit for stroke workup - Permissive HTN x48 hrs from sx onset or until stroke ruled out by MRI goal BP <220/110. PRN labetalol or hydralazine  if BP above these parameters. Avoid oral antihypertensives. - MRI brain wo contrast - TTE w/ bubble - Check A1c and LDL + add statin per guidelines - ASA 81mg  daily + plavix 75mg  daily x21 days f/b plavix 75mg  daily monotherapy after that - q4 hr neuro checks - STAT head CT for any change in neuro exam - Tele - PT/OT/SLP - Stroke education - Amb referral to neurology upon discharge   Neurology will continue to follow.  ______________________________________________________________________    Signed, Elida CHRISTELLA Ross, MD [facility]

## 2024-01-24 NOTE — ED Provider Notes (Signed)
 [facility] Provider Note    Event Date/Time   First MD Initiated Contact with Patient 01/24/24 1105     (approximate)   History   Code Stroke and Extremity Weakness   HPI  Rachel Whitehead is a 68 y.o. female with a history of atrial fibrillation followed through [facility].  Also seeing [facility] electrophysiology.  Also has a history of ablation and prior persistent A-fib.  Notes that [facility] seemed indicates she had a recent cardioversion, was briefly in normal sinus but returned to A-fib.  Continued outpatient treatment with flecainide and beta-blocker  Patient reports rather abrupt onset of weakness in her right leg starting about 830 this morning.  Also some numbing feeling in the same area.   Review of her recent medication list from [facility] does not show use of an anticoagulant, however in the patient's assessment and active problems from [facility] it does show chronic anticoagulation Scheduled Meds:   acetaminophen   975 mg Oral Q6H SCH   amLODIPine   2.5 mg Oral Daily   aspirin  81 mg Oral Daily   flecainide  100 mg Oral Q12H SCH   metoprolol SUCCinate  12.5 mg Oral Daily   pantoprazole  40 mg Oral Daily   sennosides-docusate  1 tablet Oral BID    Physical Exam   Triage Vital Signs: ED Triage Vitals  Encounter Vitals Group     BP      Girls Systolic BP Percentile      Girls Diastolic BP Percentile      Boys Systolic BP Percentile      Boys Diastolic BP Percentile      Pulse      Resp      Temp      Temp src      SpO2      Weight      Height      Head Circumference      Peak Flow      Pain Score      Pain Loc      Pain Education      Exclude from Growth Chart     Most recent vital signs: Vitals:   01/24/24 1300 01/24/24 1330  BP: 127/87 139/73  Pulse: (!) 54 (!) 53  Resp: 20 17  Temp:    SpO2: 100% 100%     General: Awake, no distress.  Seated pleasantly in wheelchair CV:  Good peripheral perfusion.  Normal tone Resp:  Normal effort.   Clear respirations.  Fully alert and oriented Abd:  No distention.  No distention Other:  Weakness and decreased sensation over the right lower leg.     ED Results / Procedures / Treatments   Labs (all labs ordered are listed, but only abnormal results are displayed) Labs Reviewed  COMPREHENSIVE METABOLIC PANEL WITH GFR - Abnormal; Notable for the following components:      Result Value   Sodium 134 (*)    CO2 20 (*)    Calcium 8.8 (*)    Total Bilirubin 2.0 (*)    All other components within normal limits  PROTIME-INR  APTT  CBC  DIFFERENTIAL  ETHANOL  CBG MONITORING, ED  I-STAT CREATININE, ED     EKG  Interpreted by me at 1250 heart rate 60 QRS 120 QTc 430 Normal sinus rhythm, no evidence of acute ischemia.  Mild nonspecific T wave abnormality   RADIOLOGY  CT head inter by me is grossly negative for hemorrhage.  CT ANGIO  HEAD NECK W WO CM (CODE STROKE) Result Date: 01/24/2024 EXAM: CTA HEAD AND NECK WITHOUT AND WITH 01/24/2024 11:31:08 AM TECHNIQUE: CTA of the head and neck was performed without and with the administration of 75 mL of iohexol  (OMNIPAQUE ) 350 MG/ML injection. Multiplanar 2D and/or 3D reformatted images are provided for review. Automated exposure control, iterative reconstruction, and/or weight based adjustment of the mA/kV was utilized to reduce the radiation dose to as low as reasonably achievable. Stenosis of the internal carotid arteries measured using NASCET criteria. COMPARISON: Head CT reported separately today. Brain MRI 10/30/2007. CLINICAL HISTORY: 68 year old female. Neuro deficit, acute, stroke suspected; RLE acute paralysis c/f L ACA acute infarct. FINDINGS: CTA NECK: AORTIC ARCH AND ARCH VESSELS: Normal 3 vessel arch. Mild aortic arch tortuosity. No dissection or arterial injury. No significant stenosis of the brachiocephalic or subclavian arteries. Mildly tortuous proximal subclavian arteries with no plaque or stenosis. CERVICAL CAROTID  ARTERIES: Bilateral cervical carotid arteries are mildly to moderately tortuous. No atherosclerosis or stenosis associated. Ectatic appearance of the distal ICAs without beading or irregularity of the vessel walls (series 7, image 136). No dissection or arterial injury. CERVICAL VERTEBRAL ARTERIES: Bilateral vertebral artery origins are mildly tortuous with no plaque or stenosis. Dominant right vertebral artery is widely patent and mildly ectatic to the skull base. Nondominant left vertebral artery is normal to the skull base. Normal right PICA origin. No dissection or arterial injury. LUNGS AND MEDIASTINUM: Unremarkable. SOFT TISSUES: No acute abnormality. BONES: Mild for age spine degeneration. No acute abnormality. CTA HEAD: ANTERIOR CIRCULATION: Both ICA siphons are ectatic, more so the right, with no plaque or stenosis. Normal right posterior communicating artery origin. Normal MCA and ACA origin. Diminutive or absent anterior communicating artery. Bilateral ACA branches are within normal limits. Tortuous MCA M1 segments. Patent MCA bifurcations without stenosis. Right MCA branches are within normal limits. Left MCA bifurcation or trifurcation is positive for a tiny saccular aneurysm directed laterally, 1 to 2 mm in size, best seen on series 8, image 140. Left MCA branches otherwise within normal limits. POSTERIOR CIRCULATION: The distal left vertebral artery terminates in PICA. The right vertebral artery supplies the basilar artery. Tortuous distal right vertebral artery without stenosis. Tortuous basilar artery without stenosis. Normal SCA and left PCA origins. Fetal right PCA origin. Diminutive or absent left posterior communicating artery. No significant stenosis of the posterior cerebral arteries. No significant stenosis of the vertebral arteries. No aneurysm. OTHER: Early intracranial venous enhancement. Superior sagittal sinus is patent. No dural venous sinus thrombosis on this non-dedicated study. .  These results were communicated to Dr. Matthews at 1139 hours on 01/24/2024 by text page via the [facility] messaging system. IMPRESSION: 1. No large vessel occlusion. Generalized arterial ectasia in the head and neck. Minimal atherosclerosis. No stenosis 2. 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 3. These results were communicated to Dr. Matthews at 11:39 hours on 01/24/2024 by text page via the [facility] messaging system. Electronically signed by: Helayne Hurst MD 01/24/2024 11:41 AM EDT RP Workstation: [id number]   CT HEAD CODE STROKE WO CONTRAST Result Date: 01/24/2024 EXAM: CT HEAD WITHOUT CONTRAST 01/24/2024 11:09:32 AM TECHNIQUE: CT of the head was performed without the administration of intravenous contrast. Automated exposure control, iterative reconstruction, and/or weight based adjustment of the mA/kV was utilized to reduce the radiation dose to as low as reasonably achievable. COMPARISON: Head CT and MRI 10/29/2017. CLINICAL HISTORY: Neuro deficit, acute, stroke suspected. Right leg numbness and weakness. Code  stroke. FINDINGS: BRAIN AND VENTRICLES: A 1 cm hypodensity in the left cerebellar hemisphere is new and suspicious for an acute or subacute infarct. No acute supratentorial infarct, intracranial hemorrhage, mass, midline shift, hydrocephalus, or extra-axial fluid collection is identified. No age-advanced cerebral atrophy is evident. Encephalomalacia in the posteroinferior right temporal lobe is unchanged. Patchy hypodensity elsewhere in the cerebral white matter bilaterally have progressed and are nonspecific but compatible with moderate chronic small vessel ischemic disease. No hyperdense vessel. ORBITS: No acute abnormality. SINUSES: No acute abnormality. SOFT TISSUES AND SKULL: No acute soft tissue abnormality. No skull fracture. [place] Stroke Program Early CT Score (ASPECTS) ----- Ganglionic (caudate, IC, lentiform nucleus, insula, M1-M3): 7 Supraganglionic (M4-M6): 3 Total: 10  IMPRESSION: 1. Small acute/subacute left cerebellar infarct. 2. ASPECTS of 10. No intracranial hemorrhage. 3. Moderate chronic small vessel ischemic disease. 4. These results were communicated to Dr. Matthews at 11:18 AM on 01/24/2024 by secure text page via the [facility] messaging system. Electronically signed by: Dasie Hamburg MD 01/24/2024 11:36 AM EDT RP Workstation: [id number]   Radiologist did note small subacute to acute left cerebellar infarct   PROCEDURES:  Critical Care performed: Yes, see critical care procedure note(s)  CRITICAL CARE Performed by: Oneil Budge   Total critical care time: 30 minutes  Critical care time was exclusive of separately billable procedures and treating other patients.  Critical care was necessary to treat or prevent imminent or life-threatening deterioration.  Critical care was time spent personally by me on the following activities: development of treatment plan with patient and/or surrogate as well as nursing, discussions with consultants, evaluation of patient's response to treatment, examination of patient, obtaining history from patient or surrogate, ordering and performing treatments and interventions, ordering and review of laboratory studies, ordering and review of radiographic studies, pulse oximetry and re-evaluation of patient's condition.   Procedures   MEDICATIONS ORDERED IN ED: Medications  sodium chloride  flush (NS) 0.9 % injection 3 mL (has no administration in time range)  aspirin chewable tablet 324 mg (has no administration in time range)  clopidogrel (PLAVIX) tablet 75 mg (has no administration in time range)  iohexol  (OMNIPAQUE ) 350 MG/ML injection 75 mL (75 mLs Intravenous Contrast Given 01/24/24 1130)     IMPRESSION / MDM / ASSESSMENT AND PLAN / ED COURSE  I reviewed the triage vital signs and the nursing notes.                              Differential diagnosis includes, but is not limited to, stroke, embolic, hemorrhage, etc.   With the patient's clinical history and evaluation it seems stroke is highly likely.  Code stroke was activated on arrival.  Patient's presentation is most consistent with acute presentation with potential threat to life or bodily function.   The patient is on the cardiac monitor to evaluate for evidence of arrhythmia and/or significant heart rate changes.  Dr. Matthews discussed thrombolytic treatment with the patient, her symptoms are slowly improving, and the patient does not wish to proceed with thrombolytic at this time as of 12 PM.  She advising that she has a history of renal hemorrhage while on anticoagulation previously which is the reason she is no longer taking.  Neurology team has discussed with the patient reviewed stroke treatment options, and at this time current plan is to continue with every 30 minute NIH scores, evaluate for worsening symptoms that could lead to a decision to treat up until  the end of her thrombolytic window at 2 PM.  There is no evidence of large vessel occlusion.  Dr. Matthews advising no treatment with Plavix or salicylate at this time until decision is finalized as to whether the patient will need thrombolytic  Clinical Course as of 01/24/24 1406  Thu Jan 24, 2024  1201 Further clarified with the patient, and she reports her last known well and symptoms started abruptly at 9:30 AM now.  9:30 AM [MQ]  1244 Metabolic panel no significant abnormalities slightly reduced CO2 at 20.  Minimally elevated bilirubin at 2 with no associate abdominal symptoms.  CBC is normal including platelet count [MQ]  1245 Current plan is to observe the patient with ongoing every 30 minute stroke scales until 2 PM. [MQ]  1245 Side on disposition at that time we will do, if no need for thrombolytic at that point would admit to hospitalist service [MQ]    Clinical Course User Index [MQ] Dicky Anes, MD   ----------------------------------------- 12:06 PM on  01/24/2024 ----------------------------------------- The patient is alert and oriented.  She is showing improved strength able to lift the right leg against gravity now.  ----------------------------------------- 2:06 PM on 01/24/2024 ----------------------------------------- Patient admitted to hospitalist service under the care of Dr. Laurita.  Aspirin and Plavix ordered as dose  directed by Dr. Matthews  Patient and son very agreeable with plan for admission  FINAL CLINICAL IMPRESSION(S) / ED DIAGNOSES   Final diagnoses:  Ischemic stroke [facility])     Rx / DC Orders   ED Discharge Orders     None        Note:  This document was prepared using Dragon voice recognition software and may include unintentional dictation errors.   Dicky Anes, MD 01/24/24 [phone]

## 2024-01-24 NOTE — ED Notes (Signed)
 Pt would like to wait for son to arrive before she decides to do TNK.

## 2024-01-24 NOTE — Plan of Care (Signed)

## 2024-01-24 NOTE — ED Triage Notes (Addendum)
 First Nurse Note:  Pt via [facility] from home. Pt c/o R leg numbness and unable to hold up her R leg that started around 0930 when she attempted to go to the bathroom.  EMS reports Normal grip strength. No facial droop. Pt has a cardiac ablation on 10/14. Pt is A&Ox4 and NAD   EMS reports 75 HR, 152/89 BP, 99% on RA, 112 CBG, 97.4 oral

## 2024-01-24 NOTE — ED Notes (Signed)
 Pt transported to MRI

## 2024-01-25 ENCOUNTER — Observation Stay

## 2024-01-25 ENCOUNTER — Observation Stay
Admit: 2024-01-25 | Discharge: 2024-01-25 | Disposition: A | Discharge status: Home / Self Care | Attending: Internal Medicine | Admitting: Internal Medicine

## 2024-01-25 ENCOUNTER — Encounter: Payer: Self-pay | Admitting: Internal Medicine

## 2024-01-25 DIAGNOSIS — I639 Cerebral infarction, unspecified: Secondary | ICD-10-CM | POA: Diagnosis not present

## 2024-01-25 LAB — ECHOCARDIOGRAM COMPLETE
AR max vel: 2.58 cm2
AV Area VTI: 2.76 cm2
AV Area mean vel: 2.63 cm2
AV Mean grad: 5 mmHg
AV Peak grad: 9.9 mmHg
Ao pk vel: 1.57 m/s
Area-P 1/2: 3.27 cm2
Calc EF: 60.3 %
Height: 64 in
MV VTI: 2.62 cm2
S' Lateral: 1.8 cm
Single Plane A2C EF: 61.3 %
Single Plane A4C EF: 66 %
Weight: 2825.6 [oz_av]

## 2024-01-25 LAB — LIPID PANEL
Cholesterol: 212 mg/dL — ABNORMAL HIGH (ref 0–200)
HDL: 57 mg/dL (ref >40)
LDL Cholesterol: 143 mg/dL — ABNORMAL HIGH (ref 0–99)
Total CHOL/HDL Ratio: 3.7 ratio
Triglycerides: 62 mg/dL (ref <150)
VLDL: 12 mg/dL (ref 0–40)

## 2024-01-25 LAB — HIV ANTIBODY (ROUTINE TESTING W REFLEX): HIV Screen 4th Generation wRfx: NONREACTIVE

## 2024-01-25 MED ORDER — ATORVASTATIN CALCIUM 20 MG PO TABS
80.0000 mg | ORAL_TABLET | Freq: Every day | ORAL | Status: DC
  Administered 2024-01-25 – 2024-01-26 (×2): 80 mg via ORAL
  Filled 2024-01-25 (×2): qty 4

## 2024-01-25 NOTE — Evaluation (Signed)
 Physical Therapy Evaluation Patient Details Name: Rachel Whitehead MRN: [id number] DOB: [birth year] Today's Date: 01/25/2024  History of Present Illness  Pt is a 68 y.o. female with medical history significant of PAF status post cardioversion and recent ablation, status post Maze procedure, severe hematuria, previous stroke  left eye, HTN, presented with acute onset of right leg weakness and numbness.  MRI impression includes small acute infarct in the left precentral gyrus and small early subacute left cerebellar infarct.   Clinical Impression  Pt was pleasant and motivated to participate during the session and put forth good effort throughout. Pt with noted deficits in RLE strength and gross motor coordination compared to the LLE as well as minor differences in sensation to light touch.  RUE strength, coordination, and sensation all WNL and equal to LUE.  Pt's SLS time was actually more impaired on her LLE compared to her RLE on multiple attempts but RLE deficits also present.  Pt ambulated both with a RW and without an AD with noted improvement in cadence and drifting with the RW per below.  Pt reported no adverse symptoms during the session with SpO2 and HR WNL throughout on room air.  Pt will benefit from continued PT services upon discharge to safely address deficits listed in patient problem list for decreased caregiver assistance and eventual return to PLOF.           If plan is discharge home, recommend the following: A little help with walking and/or transfers;A little help with bathing/dressing/bathroom;Assistance with cooking/housework;Help with stairs or ramp for entrance;Assist for transportation   Can travel by private vehicle        Equipment Recommendations Rolling walker (2 wheels)  Recommendations for Other Services       Functional Status Assessment Patient has had a recent decline in their functional status and demonstrates the ability to make significant improvements in  function in a reasonable and predictable amount of time.     Precautions / Restrictions Precautions Precautions: Fall Recall of Precautions/Restrictions: Intact Restrictions Weight Bearing Restrictions Per Provider Order: No      Mobility  Bed Mobility               General bed mobility comments: NT, in recliner pre/post session    Transfers Overall transfer level: Needs assistance Equipment used: None   Sit to Stand: Supervision           General transfer comment: Good eccentric and concentric control and stability with min use of UEs on armrests    Ambulation/Gait Ambulation/Gait assistance: Contact guard assist Gait Distance (Feet): 30 Feet x 1, 150 Feet x 1 Assistive device: None, Rolling walker (2 wheels) Gait Pattern/deviations: Step-through pattern, Decreased step length - right, Decreased step length - left, Drifts right/left Gait velocity: decreased     General Gait Details: Pt able to amb 30 feet without an AD with slow, cautious cadence and min to mod drifting left and right but no overt LOB; pt able to amb 150 feet with a RW with notable decrease in drifting and with improved cadence grossly; decreased RLE stance time/LLE step length present both with and without an AD  Stairs            Wheelchair Mobility     Tilt Bed    Modified Rankin (Stroke Patients Only)       Balance Overall balance assessment: Needs assistance Sitting-balance support: No upper extremity supported, Feet supported Sitting balance-Leahy Scale: Good     Standing balance  support: During functional activity, No upper extremity supported Standing balance-Leahy Scale: Fair Standing balance comment: Improved stability with RW compared to no AD Single Leg Stance - Right Leg: 5 Single Leg Stance - Left Leg: 2           High Level Balance Comments: Decreased SLS time and stability on her stronger LLE compared to her involved RLE             Pertinent  Vitals/Pain Pain Assessment Pain Assessment: No/denies pain    Home Living Family/patient expects to be discharged to:: Private residence Living Arrangements: Alone Available Help at Discharge: Family;Available PRN/intermittently Type of Home: House Home Access: Ramped entrance       Home Layout: One level Home Equipment: None Additional Comments: Patient [place] on [place] in her own residence, son lives on property and will be able to check on patient daily but works during the day    Prior Function Prior Level of Function : Independent/Modified Independent             Mobility Comments: Ind amb community distances without an AD ADLs Comments: Ind with ADLs     Extremity/Trunk Assessment   Upper Extremity Assessment Upper Extremity Assessment: Overall WFL for tasks assessed    Lower Extremity Assessment Lower Extremity Assessment: RLE deficits/detail;LLE deficits/detail RLE Deficits / Details: Hip flexion, knee ext, and knee flex all 4-/5; sensation to light touch intact but different, sharp at times RLE Coordination: decreased gross motor LLE Deficits / Details: LLE strength WNL LLE Sensation: WNL LLE Coordination: WNL       Communication   Communication Communication: No apparent difficulties    Cognition Arousal: Alert Behavior During Therapy: WFL for tasks assessed/performed   PT - Cognitive impairments: No apparent impairments                         Following commands: Intact       Cueing Cueing Techniques: Verbal cues     General Comments      Exercises     Assessment/Plan    PT Assessment Patient needs continued PT services  PT Problem List Decreased strength;Decreased balance;Decreased mobility;Decreased coordination;Decreased knowledge of use of DME       PT Treatment Interventions DME instruction;Gait training;Stair training;Functional mobility training;Therapeutic activities;Therapeutic exercise;Balance  training;Neuromuscular re-education;Patient/family education    PT Goals (Current goals can be found in the Care Plan section)  Acute Rehab PT Goals Patient Stated Goal: Improved strength PT Goal Formulation: With patient Time For Goal Achievement: 02/07/24 Potential to Achieve Goals: Good    Frequency 7X/week     Co-evaluation               AM-PAC PT 6 Clicks Mobility  Outcome Measure Help needed turning from your back to your side while in a flat bed without using bedrails?: None Help needed moving from lying on your back to sitting on the side of a flat bed without using bedrails?: None Help needed moving to and from a bed to a chair (including a wheelchair)?: A Little Help needed standing up from a chair using your arms (e.g., wheelchair or bedside chair)?: A Little Help needed to walk in hospital room?: A Little Help needed climbing 3-5 steps with a railing? : A Little 6 Click Score: 20    End of Session Equipment Utilized During Treatment: Gait belt Activity Tolerance: Patient tolerated treatment well Patient left: in chair;with call bell/phone within reach;with chair alarm  set Nurse Communication: Mobility status PT Visit Diagnosis: Unsteadiness on feet (R26.81);Difficulty in walking, not elsewhere classified (R26.2);Hemiplegia and hemiparesis;Muscle weakness (generalized) (M62.81) Hemiplegia - Right/Left: Right Hemiplegia - dominant/non-dominant: Dominant Hemiplegia - caused by: Cerebral infarction    Time: 1036-1100 PT Time Calculation (min) (ACUTE ONLY): 24 min   Charges:   PT Evaluation $PT Eval Moderate Complexity: 1 Mod   PT General Charges $$ ACUTE PT VISIT: 1 Visit    D. Scott Scott Vanderveer PT, DPT 01/25/24, 11:25 AM

## 2024-01-25 NOTE — Plan of Care (Signed)

## 2024-01-25 NOTE — Progress Notes (Signed)
  PROGRESS NOTE    Rachel Whitehead  FMW:[id number] DOB: 1955-08-18 DOA: 01/24/2024 PCP: Edman Marsa PARAS, DO  108A/[id number]-AA  LOS: 0 days   Brief hospital course:   Assessment & Plan: Rachel Whitehead is a 68 y.o. female with medical history significant of PAF status post cardioversion and recent ablation, status post Maze procedure, poor tolerance to anticoagulation due to recurrent severe hematuria, previous stroke  left eye, HTN, presented with acute onset of right leg weakness.   Symptoms started this morning around 9:30 AM, patient started develop sudden onset of numbness and weakness of the right lower extremity  could not stand on right foot.  Came to ED, CT head showed acute/subacute left cerebellar.   Patient was offered TNK therapy however she declined after knowing about side effects.  Right leg weakness significantly improved during ED stay.   Acute right lower extremity paresis Acute/subacute left cerebellar infarct --ASA and plavix for 21 days, f/b plavix --start lipitor 80 mg daily --Outpatient referral to neurology.    PAF status post recent ablation - History of poor tolerance to anticoagulation due to recurrent severe hematuria, s/p Maze procedure this year. --cont flecainide   HTN --cont lasix  and Toprol  HLD --LDL 143 --start lipitor 80 mg daily   DVT prophylaxis: Lovenox SQ Code Status: Full code  Family Communication: god son updated at bedside today Level of care: Telemetry Medical Dispo:   The patient is from: home Anticipated d/c is to: home Anticipated d/c date is: tomorrow   Subjective and Interval History:  Pt reported weakness improved, but felt unsteady on her feet.  Did not feel comfortable going home today.   Objective: Vitals:   01/25/24 0744 01/25/24 1232 01/25/24 1636 01/25/24 1933  BP: (!) 105/56 122/65 111/66 123/72  Pulse: (!) 54 (!) 56 (!) 57 61  Resp: 16 16 16 18   Temp: 98.2 F (36.8 C) 98.2 F (36.8 C) 98 F (36.7 C)  98 F (36.7 C)  TempSrc:      SpO2: 100% 99% 99% 98%  Weight:      Height:        Intake/Output Summary (Last 24 hours) at 01/25/2024 2124 Last data filed at 01/25/2024 1954 Gross per 24 hour  Intake 720 ml  Output --  Net 720 ml   Filed Weights   01/24/24 1153  Weight: 80.1 kg    Examination:   Constitutional: NAD, AAOx3 HEENT: conjunctivae and lids normal, EOMI CV: No cyanosis.   RESP: normal respiratory effort, on RA Neuro: II - XII grossly intact.   Psych: Normal mood and affect.  Appropriate judgement and reason   Data Reviewed: I have personally reviewed labs and imaging studies  Time spent: 50 minutes  Ellouise Haber, MD [facility] If 7PM-7AM, please contact night-coverage 01/25/2024, 9:24 PM

## 2024-01-25 NOTE — Progress Notes (Signed)
 Brief assessment note  Patient is from home with an acute/subacute left cerebellar infarct with RLE weakness that has improved. No TOC needs identified at this time. Please reach out to [facility] if needs arise.

## 2024-01-25 NOTE — Evaluation (Signed)
 Occupational Therapy Evaluation Patient Details Name: Rachel Whitehead MRN: [id number] DOB: 1955-08-10 Today's Date: 01/25/2024   History of Present Illness   Rachel Whitehead is a 68 y.o. female with medical history significant of PAF status post cardioversion and recent ablation, status post Maze procedure, poor tolerance to anticoagulation due to recurrent severe hematuria, previous stroke  left eye, HTN, presented with acute onset of right leg weakness.     Symptoms started this morning around 9:30 AM, patient started develop sudden onset of numbness and weakness of the right lower extremity  could not stand on right foot.  Came to ED, CT head showed acute/subacute left cerebellar.     Patient was offered TNK therapy however she declined after knowing about side effects.  Right leg weakness significantly improved during ED stay.     Clinical Impressions Patient was seen for OT evaluation this date. Prior to hospital admission, patient was active and independent, ambulating without AD. Patient lives alone but with good support from son and other family who live close to patient. Patient has been hospitalized due to CVA. Patient performed bed mobility without A, transfers with SBA/CGA without AD, ADLs with increased time due to weakness in RLE, but no A was required. Patient presents with deficits in dynamic standing balance and standing tolerance affecting safe and optimal ADL completion. Patient is currently requiring increased time to complete ADLs and SBA to ensure balance maintained.  Paient would benefit from skilled OT services to address noted impairments and functional limitations (see below for any additional details) in order to maximize safety and independence while minimizing future risk of falls, injury, and readmission. Anticipate the need for follow up OT services upon acute hospital DC.      If plan is discharge home, recommend the following:   A little help with walking and/or  transfers;A little help with bathing/dressing/bathroom;Assistance with cooking/housework     Functional Status Assessment   Patient has had a recent decline in their functional status and demonstrates the ability to make significant improvements in function in a reasonable and predictable amount of time.     Equipment Recommendations   BSC/3in1     Recommendations for Other Services         Precautions/Restrictions   Precautions Precautions: Fall Recall of Precautions/Restrictions: Intact Restrictions Weight Bearing Restrictions Per Provider Order: No     Mobility Bed Mobility Overal bed mobility: Modified Independent             General bed mobility comments: HOB raised    Transfers Overall transfer level: Needs assistance Equipment used: 1 person hand held assist Transfers: Sit to/from Stand, Bed to chair/wheelchair/BSC Sit to Stand: Contact guard assist, Supervision     Step pivot transfers: Supervision, Contact guard assist            Balance Overall balance assessment: Needs assistance Sitting-balance support: No upper extremity supported, Feet supported Sitting balance-Leahy Scale: Good     Standing balance support: During functional activity Standing balance-Leahy Scale: Good                             ADL either performed or assessed with clinical judgement   ADL Overall ADL's : Needs assistance/impaired Eating/Feeding: Independent   Grooming: Wash/dry hands;Wash/dry face;Brushing hair;Supervision/safety   Upper Body Bathing: Supervision/ safety   Lower Body Bathing: Supervison/ safety   Upper Body Dressing : Supervision/safety   Lower Body Dressing: Supervision/safety   Toilet Transfer:  Supervision/safety   Toileting- Architect and Hygiene: Supervision/safety   Tub/ Engineer, structural: Supervision/safety   Functional mobility during ADLs: Contact guard assist General ADL Comments: able to perform  ADLs with set up A/SPV due to minor balance deficits     Vision         Perception Perception: Within Functional Limits       Praxis Praxis: WFL       Pertinent Vitals/Pain Pain Assessment Pain Assessment: 0-10 Pain Score: 1  Pain Location: right leg Pain Descriptors / Indicators: Tingling Pain Intervention(s): Monitored during session     Extremity/Trunk Assessment Upper Extremity Assessment Upper Extremity Assessment: Overall WFL for tasks assessed   Lower Extremity Assessment Lower Extremity Assessment: RLE deficits/detail RLE Deficits / Details: patient reports it feels weaker/tingly compared to LLE       Communication Communication Communication: No apparent difficulties   Cognition Arousal: Alert Behavior During Therapy: WFL for tasks assessed/performed Cognition: No apparent impairments                               Following commands: Intact       Cueing  General Comments   Cueing Techniques: Verbal cues      Exercises     Shoulder Instructions      Home Living Family/patient expects to be discharged to:: Private residence Living Arrangements: Alone Available Help at Discharge: Family Type of Home: House Home Access: Ramped entrance     Home Layout: One level     Bathroom Shower/Tub: Producer, television/film/video: Pharmacist, community:  (unsure)   Home Equipment: None   Additional Comments: patient lives on [place] acres in her own residence, son lives on property and will be able to check on patient daily      Prior Functioning/Environment Prior Level of Function : Independent/Modified Independent             Mobility Comments: ambulating without AD ADLs Comments: mod I    OT Problem List: Decreased activity tolerance;Impaired balance (sitting and/or standing)   OT Treatment/Interventions: Self-care/ADL training;Therapeutic exercise;Neuromuscular education;Energy conservation;DME and/or AE  instruction;Patient/family education;Balance training      OT Goals(Current goals can be found in the care plan section)   Acute Rehab OT Goals Patient Stated Goal: to be well enough to go home OT Goal Formulation: With patient Time For Goal Achievement: 02/08/24 Potential to Achieve Goals: Good ADL Goals Pt Will Perform Grooming: with modified independence;standing Pt Will Perform Lower Body Dressing: with modified independence;sit to/from stand Pt Will Transfer to Toilet: ambulating;regular height toilet;with modified independence   OT Frequency:  Min 2X/week    Co-evaluation              AM-PAC OT 6 Clicks Daily Activity     Outcome Measure Help from another person eating meals?: None Help from another person taking care of personal grooming?: None Help from another person toileting, which includes using toliet, bedpan, or urinal?: A Little Help from another person bathing (including washing, rinsing, drying)?: A Little Help from another person to put on and taking off regular upper body clothing?: A Little Help from another person to put on and taking off regular lower body clothing?: A Little 6 Click Score: 20   End of Session Nurse Communication: Other (comment) (patient ready for meds, felt slightly dizzy)  Activity Tolerance: Patient tolerated treatment well Patient left: in chair;with call bell/phone within reach;with chair  alarm set  OT Visit Diagnosis: Unsteadiness on feet (R26.81)                Time: 9078-9054 OT Time Calculation (min): 24 min Charges:  OT General Charges $OT Visit: 1 Visit OT Evaluation $OT Eval Low Complexity: 1 Low OT Treatments $Self Care/Home Management : 23-37 mins  Rogers Clause, OT/L MSOT, 01/25/2024

## 2024-01-25 NOTE — Care Management Obs Status (Signed)
 MEDICARE OBSERVATION STATUS NOTIFICATION   Patient Details  Name: Rachel Whitehead MRN: [id number] Date of Birth: Jun 12, 1955   Medicare Observation Status Notification Given:  Yes    Gertude Benito W, CMA 01/25/2024, 1:49 PM

## 2024-01-25 NOTE — Plan of Care (Signed)
  Problem: Education: Goal: Knowledge of disease or condition will improve Outcome: Progressing   Problem: Ischemic Stroke/TIA Tissue Perfusion: Goal: Complications of ischemic stroke/TIA will be minimized Outcome: Progressing   Problem: Coping: Goal: Will verbalize positive feelings about self Outcome: Progressing   Problem: Health Behavior/Discharge Planning: Goal: Ability to manage health-related needs will improve Outcome: Progressing   Problem: Self-Care: Goal: Ability to participate in self-care as condition permits will improve Outcome: Progressing   Problem: Education: Goal: Knowledge of General Education information will improve Description: Including pain rating scale, medication(s)/side effects and non-pharmacologic comfort measures Outcome: Progressing   Problem: Health Behavior/Discharge Planning: Goal: Ability to manage health-related needs will improve Outcome: Progressing   Problem: Clinical Measurements: Goal: Ability to maintain clinical measurements within normal limits will improve Outcome: Progressing   Problem: Activity: Goal: Risk for activity intolerance will decrease Outcome: Progressing   Problem: Nutrition: Goal: Adequate nutrition will be maintained Outcome: Progressing   Problem: Coping: Goal: Level of anxiety will decrease Outcome: Progressing   Problem: Elimination: Goal: Will not experience complications related to bowel motility Outcome: Progressing   Problem: Pain Managment: Goal: General experience of comfort will improve and/or be controlled Outcome: Progressing   Problem: Safety: Goal: Ability to remain free from injury will improve Outcome: Progressing   Problem: Skin Integrity: Goal: Risk for impaired skin integrity will decrease Outcome: Progressing

## 2024-01-26 ENCOUNTER — Other Ambulatory Visit: Payer: Self-pay

## 2024-01-26 DIAGNOSIS — I639 Cerebral infarction, unspecified: Secondary | ICD-10-CM | POA: Diagnosis not present

## 2024-01-26 MED ORDER — ATORVASTATIN CALCIUM 80 MG PO TABS: 80.0000 mg | ORAL_TABLET | Freq: Every day | ORAL | 2 refills | Status: DC

## 2024-01-26 MED ORDER — ASPIRIN 81 MG PO TBEC: 81.0000 mg | DELAYED_RELEASE_TABLET | Freq: Every day | ORAL | 0 refills | Status: DC

## 2024-01-26 MED ORDER — CLOPIDOGREL BISULFATE 75 MG PO TABS: 75.0000 mg | ORAL_TABLET | Freq: Every day | ORAL | 2 refills | Status: DC

## 2024-01-26 NOTE — Discharge Instructions (Signed)
 [facility] will be providing physical and occupational therapy in the home. The liaison will call and schedule the initial assessment 24-48 hours after discharge.

## 2024-01-26 NOTE — TOC Progression Note (Addendum)
 Transition of Care [facility]) - Progression Note    Patient Details  Name: Rachel Whitehead MRN: [id number] Date of Birth: 1955-08-01  Transition of [facility] CM/SW Contact  Erick Oxendine L [place], [place] Phone Number: 01/26/2024, 8:22 AM  Clinical Narrative:     Secure chat received. Patient pending discharge. Rolling Walker ordered through weekend intake for [facility]. Orders for Home Health reviewed. CSW sent offers through [employer] portal for [facility] to review. Offers pending. CSW will review agencies with patient and offer choice.    9:01pm: CSW called the patients son, Marsa, to review choice for [facility]. CSW attempted to reach patient but to no avail. CSW left a HIPAA compliant voicemail advising of DME order and [facility]. [facility] responded to the portal search and they have services available.                    Expected Discharge Plan and Services         Expected Discharge Date: 01/25/24                                     Social Drivers of Health (SDOH) Interventions SDOH Screenings   Food Insecurity: No Food Insecurity (01/24/2024)  Housing: Low Risk  (01/24/2024)  Transportation Needs: No Transportation Needs (01/24/2024)  Utilities: Not At Risk (01/24/2024)  Alcohol Screen: Low Risk  (10/02/2022)  Depression (PHQ2-9): Low Risk  (09/04/2023)  Financial Resource Strain: Low Risk  (06/19/2023)   Received from [facility]  Physical Activity: Sufficiently Active (03/05/2023)  Social Connections: Moderately Isolated (01/24/2024)  Stress: No Stress Concern Present (03/05/2023)  Tobacco Use: Low Risk  (01/15/2024)   Received from [facility]  Health Literacy: Adequate Health Literacy (03/05/2023)    Readmission Risk Interventions     No data to display

## 2024-01-26 NOTE — Progress Notes (Signed)
 Physical Therapy Treatment Patient Details Name: Rachel Whitehead MRN: [id number] DOB: 10/12/1955 Today's Date: 01/26/2024   History of Present Illness Pt is a 68 y.o. female with medical history significant of PAF status post cardioversion and recent ablation, status post Maze procedure, severe hematuria, previous stroke  left eye, HTN, presented with acute onset of right leg weakness and numbness.  MRI impression includes small acute infarct in the left precentral gyrus and small early subacute left cerebellar infarct.    PT Comments  Pt is making good progress with mobility demonstrating Mod I with bed mobility, transfers, short gait distances, stair negotiation, and supervision with dynamic higher level standing balance and longer community type gait distances.   Pt demonstrates fall risk for higher level dynamic standing balance involving SLS and narrow BOS on level surface.  Pt will benefit from continued PT services upon discharge to safely address deficits listed in patient problem list for decreased caregiver assistance and eventual return to PLOF.     If plan is discharge home, recommend the following: A little help with walking and/or transfers;A little help with bathing/dressing/bathroom;Assistance with cooking/housework;Help with stairs or ramp for entrance;Assist for transportation   Can travel by private vehicle        Equipment Recommendations  Rolling walker (2 wheels)    Recommendations for Other Services       Precautions / Restrictions Precautions Precautions: Fall Recall of Precautions/Restrictions: Intact Restrictions Weight Bearing Restrictions Per Provider Order: No     Mobility  Bed Mobility Overal bed mobility: Modified Independent                  Transfers Overall transfer level: Modified independent Equipment used: None Transfers: Sit to/from Stand, Bed to chair/wheelchair/BSC Sit to Stand: Modified independent (Device/Increase time)   Step  pivot transfers: Modified independent (Device/Increase time)       General transfer comment: demonostrated safety. no cues needed.    Ambulation/Gait Ambulation/Gait assistance: Supervision Gait Distance (Feet): 300 Feet Assistive device: None Gait Pattern/deviations: Step-through pattern, Decreased step length - right, WFL(Within Functional Limits) Gait velocity: WFL     General Gait Details: Demonstrated no drifitng, self selected speed with increase velocity and no LOB.  Slightly slower with R swing phase.   Stairs Stairs: Yes Stairs assistance: Modified independent (Device/Increase time) Stair Management: One rail Right, Alternating pattern Number of Stairs: 8 General stair comments: no LOB   Wheelchair Mobility     Tilt Bed    Modified Rankin (Stroke Patients Only)       Balance Overall balance assessment: Modified Independent   Sitting balance-Leahy Scale: Normal     Standing balance support: During functional activity, No upper extremity supported Standing balance-Leahy Scale: Good Standing balance comment: Requires intermittant UE support for alternate SLS (with tapping box activity)                            Communication Communication Communication: No apparent difficulties  Cognition Arousal: Alert Behavior During Therapy: WFL for tasks assessed/performed   PT - Cognitive impairments: No apparent impairments                         Following commands: Intact      Cueing Cueing Techniques: Verbal cues  Exercises      General Comments        Pertinent Vitals/Pain Pain Assessment Pain Assessment: No/denies pain    Home Living  Prior Function            PT Goals (current goals can now be found in the care plan section) Acute Rehab PT Goals Patient Stated Goal: Improved strength PT Goal Formulation: With patient Time For Goal Achievement: 02/07/24 Potential to Achieve  Goals: Good Progress towards PT goals: Progressing toward goals    Frequency    7X/week      PT Plan      Co-evaluation              AM-PAC PT 6 Clicks Mobility   Outcome Measure  Help needed turning from your back to your side while in a flat bed without using bedrails?: None Help needed moving from lying on your back to sitting on the side of a flat bed without using bedrails?: None Help needed moving to and from a bed to a chair (including a wheelchair)?: None Help needed standing up from a chair using your arms (e.g., wheelchair or bedside chair)?: None Help needed to walk in hospital room?: None Help needed climbing 3-5 steps with a railing? : A Little 6 Click Score: 23    End of Session Equipment Utilized During Treatment: Gait belt Activity Tolerance: Patient tolerated treatment well Patient left: in chair;with call bell/phone within reach Nurse Communication: Mobility status PT Visit Diagnosis: Unsteadiness on feet (R26.81);Difficulty in walking, not elsewhere classified (R26.2);Hemiplegia and hemiparesis;Muscle weakness (generalized) (M62.81) Hemiplegia - Right/Left: Right Hemiplegia - dominant/non-dominant: Dominant Hemiplegia - caused by: Cerebral infarction     Time: 9149-9086 PT Time Calculation (min) (ACUTE ONLY): 23 min  Charges:    $Gait Training: 8-22 mins $Therapeutic Activity: 8-22 mins PT General Charges $$ ACUTE PT VISIT: 1 Visit                     Harland Irving, PTA  01/26/24, 9:28 AM

## 2024-01-26 NOTE — Progress Notes (Signed)
 The beneficiary has a mobility limitation that significantly impairs his/her ability to participate in one or more mobility-related activities of daily living (MRADL) in the home. The patient is able to safely use the walker. The functional mobility deficit can be sufficiently resolved by use of walker.

## 2024-01-26 NOTE — Discharge Summary (Signed)
 Physician Discharge Summary   Rachel Whitehead  female DOB: 07/24/1955  FMW:[id number]  PCP: Edman Marsa PARAS, DO  Admit date: 01/24/2024 Discharge date: 01/26/2024  Admitted From: home Disposition:  home Home Health: Yes CODE STATUS: Full code  Discharge Instructions     Ambulatory referral to Neurology   Complete by: As directed    An appointment is requested in approximately: 4 weeks   Diet - low sodium heart healthy   Complete by: As directed    Discharge instructions   Complete by: As directed    Please take aspirin 81 mg and plavix 75 mg together for 21 days, and after that, just plavix 75 mg daily alone.  You are started on Lipitor 80 mg daily. [facility] Course:  For full details, please see H&P, progress notes, consult notes and ancillary notes.  Briefly,  Rachel Whitehead is a 68 y.o. female with medical history significant of PAF status post cardioversion and recent ablation, status post Maze procedure, poor tolerance to anticoagulation due to recurrent severe hematuria, previous stroke  left eye, HTN, presented with acute onset of right leg weakness.   In the ED, CT head showed acute/subacute left cerebellar.  Patient was offered TNK therapy however she declined after knowing about side effects.  Right leg weakness significantly improved while in the ED.   Acute right lower extremity paresis Acute/subacute left cerebellar infarct --CTA head/neck No large vessel occlusion.   --ASA and plavix for 21 days, f/b plavix monotherapy. --LDL 143.  started on lipitor 80 mg daily --Outpatient referral to neurology.    PAF status post recent ablation - History of poor tolerance to anticoagulation due to recurrent severe hematuria, s/p Maze procedure this year. --cont flecainide and Toprol   HTN --cont lasix  and Toprol   HLD --LDL 143 --started on lipitor 80 mg daily   Discharge Diagnoses:  Principal Problem:   Stroke [facility]) Active Problems:    Stroke (cerebrum) [facility])   Discharge Instructions:  Allergies as of 01/26/2024   No Known Allergies      Medication List     TAKE these medications    acetaminophen  325 MG tablet Commonly known as: TYLENOL  Take 975 mg by mouth every 6 (six) hours as needed for mild pain (pain score 1-3).   amLODipine  2.5 MG tablet Commonly known as: NORVASC  Take 1 tablet (2.5 mg total) by mouth daily.   aspirin EC 81 MG tablet Take 1 tablet (81 mg total) by mouth daily for 21 days. What changed: when to take this   atorvastatin 80 MG tablet Commonly known as: LIPITOR Take 1 tablet (80 mg total) by mouth daily.   clopidogrel 75 MG tablet Commonly known as: PLAVIX Take 1 tablet (75 mg total) by mouth daily. What changed: when to take this   flecainide 100 MG tablet Commonly known as: TAMBOCOR Take 100 mg by mouth 2 (two) times daily.   furosemide  20 MG tablet Commonly known as: LASIX  Take 20 mg by mouth daily.   metoprolol succinate 25 MG 24 hr tablet Commonly known as: TOPROL-XL Take 12.5 mg by mouth daily.   multivitamin tablet Take 1 tablet by mouth daily.   potassium chloride 10 MEQ tablet Commonly known as: KLOR-CON Take 10 mEq by mouth daily.               Durable Medical Equipment  (From admission, onward)           Start  Ordered   01/25/24 1830  For home use only DME Walker rolling  Once       Question Answer Comment  Walker: With 5 Inch Wheels   Patient needs a walker to treat with the following condition Stroke [facility])      01/25/24 1829             Follow-up Information     Edman Marsa PARAS, DO Follow up in 1 week(s).   Specialty: Family Medicine Contact information: [address] [place] [address] [phone]         Maree Jannett POUR, MD Follow up.   Specialty: Neurology Contact information: [address] [facility] [place] [address] [phone]                 No Known  Allergies   The results of significant diagnostics from this hospitalization (including imaging, microbiology, ancillary and laboratory) are listed below for reference.   Consultations:   Procedures/Studies: US  Venous Img Lower Bilateral (DVT) Result Date: 01/25/2024 CLINICAL DATA:  Embolic stroke, concern for DVT EXAM: BILATERAL LOWER EXTREMITY VENOUS DOPPLER ULTRASOUND TECHNIQUE: Gray-scale sonography with graded compression, as well as color Doppler and duplex ultrasound were performed to evaluate the lower extremity deep venous systems from the level of the common femoral vein and including the common femoral, femoral, profunda femoral, popliteal and calf veins including the posterior tibial, peroneal and gastrocnemius veins when visible. The superficial great saphenous vein was also interrogated. Spectral Doppler was utilized to evaluate flow at rest and with distal augmentation maneuvers in the common femoral, femoral and popliteal veins. COMPARISON:  None Available. FINDINGS: RIGHT LOWER EXTREMITY Common Femoral Vein: No evidence of thrombus. Normal compressibility, respiratory phasicity and response to augmentation. Saphenofemoral Junction: No evidence of thrombus. Normal compressibility and flow on color Doppler imaging. Profunda Femoral Vein: No evidence of thrombus. Normal compressibility and flow on color Doppler imaging. Femoral Vein: No evidence of thrombus. Normal compressibility, respiratory phasicity and response to augmentation. Popliteal Vein: No evidence of thrombus. Normal compressibility, respiratory phasicity and response to augmentation. Calf Veins: No evidence of thrombus. Normal compressibility and flow on color Doppler imaging. LEFT LOWER EXTREMITY Common Femoral Vein: No evidence of thrombus. Normal compressibility, respiratory phasicity and response to augmentation. Saphenofemoral Junction: No evidence of thrombus. Normal compressibility and flow on color Doppler imaging.  Profunda Femoral Vein: No evidence of thrombus. Normal compressibility and flow on color Doppler imaging. Femoral Vein: No evidence of thrombus. Normal compressibility, respiratory phasicity and response to augmentation. Popliteal Vein: No evidence of thrombus. Normal compressibility, respiratory phasicity and response to augmentation. Calf Veins: No evidence of thrombus. Normal compressibility and flow on color Doppler imaging. IMPRESSION: No evidence of deep venous thrombosis in either lower extremity. Electronically Signed   By: CHRISTELLA.  Shick M.D.   On: 01/25/2024 16:13   ECHOCARDIOGRAM COMPLETE Result Date: 01/25/2024    ECHOCARDIOGRAM REPORT   Patient Name:   Rachel Whitehead Date of Exam: 01/25/2024 Medical Rec #:  [id number]    Height:       64.0 in Accession #:    [id number]   Weight:       176.6 lb Date of Birth:  [birth year]     BSA:          1.856 m Patient Age:    68 years     BP:           153/89 mmHg Patient Gender: F  HR:           59 bpm. Exam Location:  [facility] Procedure: 2D Echo, Cardiac Doppler and Color Doppler (Both Spectral and Color            Flow Doppler were utilized during procedure). Indications:     Stroke I63.9  History:         Patient has no prior history of Echocardiogram examinations.                  Stroke.  Sonographer:     Rosina Dunk Referring Phys:  [id number] CORT ONEIDA MANA Diagnosing Phys: Keller Paterson IMPRESSIONS  1. Left ventricular ejection fraction, by estimation, is 60 to 65%. The left ventricle has normal function. The left ventricle has no regional wall motion abnormalities. Left ventricular diastolic parameters are consistent with Grade II diastolic dysfunction (pseudonormalization).  2. Right ventricular systolic function is normal. The right ventricular size is normal.  3. The mitral valve is normal in structure. Trivial mitral valve regurgitation.  4. The aortic valve is tricuspid. Aortic valve regurgitation is mild.  5. Aortic dilatation noted. There is  mild dilatation of the aortic root, measuring 39 mm.  6. The inferior vena cava is normal in size with greater than 50% respiratory variability, suggesting right atrial pressure of 3 mmHg. FINDINGS  Left Ventricle: Left ventricular ejection fraction, by estimation, is 60 to 65%. The left ventricle has normal function. The left ventricle has no regional wall motion abnormalities. The left ventricular internal cavity size was normal in size. There is  no left ventricular hypertrophy. Left ventricular diastolic parameters are consistent with Grade II diastolic dysfunction (pseudonormalization). The ratio of pulmonic flow to systemic flow (Qp/Qs ratio) is 1.70. Right Ventricle: The right ventricular size is normal. No increase in right ventricular wall thickness. Right ventricular systolic function is normal. Left Atrium: Left atrial size was normal in size. Right Atrium: Right atrial size was normal in size. Pericardium: There is no evidence of pericardial effusion. Mitral Valve: The mitral valve is normal in structure. Trivial mitral valve regurgitation. MV peak gradient, 5.5 mmHg. The mean mitral valve gradient is 2.0 mmHg. Tricuspid Valve: The tricuspid valve is normal in structure. Tricuspid valve regurgitation is mild. Aortic Valve: The aortic valve is tricuspid. Aortic valve regurgitation is mild. Aortic valve mean gradient measures 5.0 mmHg. Aortic valve peak gradient measures 9.9 mmHg. Aortic valve area, by VTI measures 2.76 cm. Pulmonic Valve: The pulmonic valve was not well visualized. Pulmonic valve regurgitation is trivial. Aorta: Aortic dilatation noted. There is mild dilatation of the aortic root, measuring 39 mm. Venous: The inferior vena cava is normal in size with greater than 50% respiratory variability, suggesting right atrial pressure of 3 mmHg. IAS/Shunts: The atrial septum is grossly normal. The ratio of pulmonic flow to systemic flow (Qp/Qs ratio) is 1.70.  LEFT VENTRICLE PLAX 2D LVIDd:          4.30 cm     Diastology LVIDs:         1.80 cm     LV e' medial:    4.35 cm/s LV PW:         1.10 cm     LV E/e' medial:  21.9 LV IVS:        0.90 cm     LV e' lateral:   8.93 cm/s LVOT diam:     2.10 cm     LV E/e' lateral: 10.7 LV SV:  89 LV SV Index:   48 LVOT Area:     3.46 cm  LV Volumes (MOD) LV vol d, MOD A2C: 51.1 ml LV vol d, MOD A4C: 61.2 ml LV vol s, MOD A2C: 19.8 ml LV vol s, MOD A4C: 20.8 ml LV SV MOD A2C:     31.3 ml LV SV MOD A4C:     61.2 ml LV SV MOD BP:      34.7 ml RIGHT VENTRICLE             IVC RV Basal diam:  4.10 cm     IVC diam: 1.80 cm RV Mid diam:    3.10 cm RV S prime:     11.70 cm/s RVOT diam:      3.00 cm TAPSE (M-mode): 1.4 cm LEFT ATRIUM             Index        RIGHT ATRIUM           Index LA Vol (A2C):   45.6 ml 24.57 ml/m  RA Area:     20.00 cm LA Vol (A4C):   54.3 ml 29.26 ml/m  RA Volume:   54.80 ml  29.53 ml/m LA Biplane Vol: 52.7 ml 28.40 ml/m  AORTIC VALVE                     PULMONIC VALVE AV Area (Vmax):    2.58 cm      PV Area (Vmax):   6.14 cm AV Area (Vmean):   2.63 cm      PV Area (Vmean):  6.68 cm AV Area (VTI):     2.76 cm      PV Area (VTI):    6.73 cm AV Vmax:           157.00 cm/s   PV Vmax:          0.92 m/s AV Vmean:          103.000 cm/s  PV Vmean:         65.900 cm/s AV VTI:            0.323 m       PV VTI:           0.227 m AV Peak Grad:      9.9 mmHg      PV Peak grad:     3.4 mmHg AV Mean Grad:      5.0 mmHg      PV Mean grad:     2.0 mmHg LVOT Vmax:         117.00 cm/s   PR End Diast Vel: 2.85 msec LVOT Vmean:        78.200 cm/s   RVOT Peak grad:   3 mmHg LVOT VTI:          0.257 m LVOT/AV VTI ratio: 0.80  AORTA Ao Root diam: 3.90 cm Ao Asc diam:  3.50 cm MITRAL VALVE               TRICUSPID VALVE MV Area (PHT): 3.27 cm    TR Peak grad:   22.5 mmHg MV Area VTI:   2.62 cm    TR Mean grad:   16.0 mmHg MV Peak grad:  5.5 mmHg    TR Vmax:        237.00 cm/s MV Mean grad:  2.0 mmHg    TR Vmean:       197.0 cm/s MV Vmax:  1.17 m/s MV Vmean:       57.2 cm/s   SHUNTS MV Decel Time: 232 msec    Systemic VTI:  0.26 m MV E velocity: 95.20 cm/s  Systemic Diam: 2.10 cm MV A velocity: 37.70 cm/s  Pulmonic VTI:  0.216 m MV E/A ratio:  2.53        Pulmonic Diam: 3.00 cm                            Qp/Qs:         1.72 Keller Paterson Electronically signed by Keller Paterson Signature Date/Time: 01/25/2024/1:59:28 PM    Final    MR BRAIN WO CONTRAST Result Date: 01/24/2024 EXAM: MRI BRAIN WITHOUT CONTRAST 01/24/2024 03:27:04 PM TECHNIQUE: Multiplanar multisequence MRI of the head/brain was performed without the administration of intravenous contrast. COMPARISON: Head CT and CTA 01/24/2024 and MRI 10/29/2017. CLINICAL HISTORY: Neuro deficit, acute, stroke suspected. FINDINGS: BRAIN AND VENTRICLES: There is a 1 cm focus of mildly restricted diffusion involving cortex and subcortical white matter in the medial aspect of the left precentral gyrus consistent with an acute infarct. A 1 cm focus of T2 and trace diffusion weighted signal hyperintensity in the left cerebellar hemisphere with normal ADC is most compatible with an early subacute infarct. Patchy T2 hyperintensities elsewhere in the cerebral white matter bilaterally have progressed from the prior MRI and are nonspecific but compatible with moderate chronic small vessel ischemic disease. There is a tiny chronic right cerebellar infarct. Encephalomalacia is again noted in the posteroinferior right temporal lobe. No age advanced cerebral atrophy, mass, midline shift, hydrocephalus, or extra axial fluid collection is evident. Numerous chronic cerebral microhemorrhages predominantly affect the deep gray nuclei suggestive of chronic hypertension. Major intracranial vascular flow voids are preserved. ORBITS: No acute abnormality. SINUSES AND MASTOIDS: No acute abnormality. BONES AND SOFT TISSUES: Normal marrow signal. No acute soft tissue abnormality. IMPRESSION: 1. Small acute infarct in the left precentral gyrus.  2. Small early subacute left cerebellar infarct. 3. Moderate chronic small vessel ischemic disease. 4. Numerous chronic cerebral microhemorrhages suggestive of chronic hypertension. Electronically signed by: Dasie Hamburg MD 01/24/2024 03:47 PM EDT RP Workstation: [id number]   CT ANGIO HEAD NECK W WO CM (CODE STROKE) Result Date: 01/24/2024 EXAM: CTA HEAD AND NECK WITHOUT AND WITH 01/24/2024 11:31:08 AM TECHNIQUE: CTA of the head and neck was performed without and with the administration of 75 mL of iohexol  (OMNIPAQUE ) 350 MG/ML injection. Multiplanar 2D and/or 3D reformatted images are provided for review. Automated exposure control, iterative reconstruction, and/or weight based adjustment of the mA/kV was utilized to reduce the radiation dose to as low as reasonably achievable. Stenosis of the internal carotid arteries measured using NASCET criteria. COMPARISON: Head CT reported separately today. Brain MRI 10/30/2007. CLINICAL HISTORY: 68 year old female. Neuro deficit, acute, stroke suspected; RLE acute paralysis c/f L ACA acute infarct. FINDINGS: CTA NECK: AORTIC ARCH AND ARCH VESSELS: Normal 3 vessel arch. Mild aortic arch tortuosity. No dissection or arterial injury. No significant stenosis of the brachiocephalic or subclavian arteries. Mildly tortuous proximal subclavian arteries with no plaque or stenosis. CERVICAL CAROTID ARTERIES: Bilateral cervical carotid arteries are mildly to moderately tortuous. No atherosclerosis or stenosis associated. Ectatic appearance of the distal ICAs without beading or irregularity of the vessel walls (series 7, image 136). No dissection or arterial injury. CERVICAL VERTEBRAL ARTERIES: Bilateral vertebral artery origins are mildly tortuous with no plaque or stenosis. Dominant right vertebral artery  is widely patent and mildly ectatic to the skull base. Nondominant left vertebral artery is normal to the skull base. Normal right PICA origin. No dissection or arterial injury.  LUNGS AND MEDIASTINUM: Unremarkable. SOFT TISSUES: No acute abnormality. BONES: Mild for age spine degeneration. No acute abnormality. CTA HEAD: ANTERIOR CIRCULATION: Both ICA siphons are ectatic, more so the right, with no plaque or stenosis. Normal right posterior communicating artery origin. Normal MCA and ACA origin. Diminutive or absent anterior communicating artery. Bilateral ACA branches are within normal limits. Tortuous MCA M1 segments. Patent MCA bifurcations without stenosis. Right MCA branches are within normal limits. Left MCA bifurcation or trifurcation is positive for a tiny saccular aneurysm directed laterally, 1 to 2 mm in size, best seen on series 8, image 140. Left MCA branches otherwise within normal limits. POSTERIOR CIRCULATION: The distal left vertebral artery terminates in PICA. The right vertebral artery supplies the basilar artery. Tortuous distal right vertebral artery without stenosis. Tortuous basilar artery without stenosis. Normal SCA and left PCA origins. Fetal right PCA origin. Diminutive or absent left posterior communicating artery. No significant stenosis of the posterior cerebral arteries. No significant stenosis of the vertebral arteries. No aneurysm. OTHER: Early intracranial venous enhancement. Superior sagittal sinus is patent. No dural venous sinus thrombosis on this non-dedicated study. . These results were communicated to Dr. Matthews at 1139 hours on 01/24/2024 by text page via the [facility] messaging system. IMPRESSION: 1. No large vessel occlusion. Generalized arterial ectasia in the head and neck. Minimal atherosclerosis. No stenosis 2. 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 3. These results were communicated to Dr. Matthews at 11:39 hours on 01/24/2024 by text page via the [facility] messaging system. Electronically signed by: Helayne Hurst MD 01/24/2024 11:41 AM EDT RP Workstation: [id number]   CT HEAD CODE STROKE WO CONTRAST Result Date: 01/24/2024 EXAM:  CT HEAD WITHOUT CONTRAST 01/24/2024 11:09:32 AM TECHNIQUE: CT of the head was performed without the administration of intravenous contrast. Automated exposure control, iterative reconstruction, and/or weight based adjustment of the mA/kV was utilized to reduce the radiation dose to as low as reasonably achievable. COMPARISON: Head CT and MRI 10/29/2017. CLINICAL HISTORY: Neuro deficit, acute, stroke suspected. Right leg numbness and weakness. Code stroke. FINDINGS: BRAIN AND VENTRICLES: A 1 cm hypodensity in the left cerebellar hemisphere is new and suspicious for an acute or subacute infarct. No acute supratentorial infarct, intracranial hemorrhage, mass, midline shift, hydrocephalus, or extra-axial fluid collection is identified. No age-advanced cerebral atrophy is evident. Encephalomalacia in the posteroinferior right temporal lobe is unchanged. Patchy hypodensity elsewhere in the cerebral white matter bilaterally have progressed and are nonspecific but compatible with moderate chronic small vessel ischemic disease. No hyperdense vessel. ORBITS: No acute abnormality. SINUSES: No acute abnormality. SOFT TISSUES AND SKULL: No acute soft tissue abnormality. No skull fracture. [place] Stroke Program Early CT Score (ASPECTS) ----- Ganglionic (caudate, IC, lentiform nucleus, insula, M1-M3): 7 Supraganglionic (M4-M6): 3 Total: 10 IMPRESSION: 1. Small acute/subacute left cerebellar infarct. 2. ASPECTS of 10. No intracranial hemorrhage. 3. Moderate chronic small vessel ischemic disease. 4. These results were communicated to Dr. Matthews at 11:18 AM on 01/24/2024 by secure text page via the [facility] messaging system. Electronically signed by: Dasie Hamburg MD 01/24/2024 11:36 AM EDT RP Workstation: [id number]      Labs: BNP (last 3 results) No results for input(s): BNP in the last 8760 hours. Basic Metabolic Panel: Recent Labs  Lab 01/24/24 1143  NA 134*  K 4.6  CL 101  CO2 20*  GLUCOSE 92  BUN 22  CREATININE  0.83  CALCIUM 8.8*   Liver Function Tests: Recent Labs  Lab 01/24/24 1143  AST 29  ALT 17  ALKPHOS 69  BILITOT 2.0*  PROT 7.7  ALBUMIN 3.8   No results for input(s): LIPASE, AMYLASE in the last 168 hours. No results for input(s): AMMONIA in the last 168 hours. CBC: Recent Labs  Lab 01/24/24 1143  WBC 6.8  NEUTROABS 4.3  HGB 13.1  HCT 40.2  MCV 90.5  PLT 375   Cardiac Enzymes: No results for input(s): CKTOTAL, CKMB, CKMBINDEX, TROPONINI in the last 168 hours. BNP: Invalid input(s): POCBNP CBG: Recent Labs  Lab 01/24/24 1100  GLUCAP 88   D-Dimer No results for input(s): DDIMER in the last 72 hours. Hgb A1c Recent Labs    01/24/24 1143  HGBA1C 5.0   Lipid Profile Recent Labs    01/25/24 0350  CHOL 212*  HDL 57  LDLCALC 143*  TRIG 62  CHOLHDL 3.7   Thyroid function studies No results for input(s): TSH, T4TOTAL, T3FREE, THYROIDAB in the last 72 hours.  Invalid input(s): FREET3 Anemia work up No results for input(s): VITAMINB12, FOLATE, FERRITIN, TIBC, IRON, RETICCTPCT in the last 72 hours. Urinalysis    Component Value Date/Time   BILIRUBINUR negative 05/09/2023 1346   PROTEINUR Negative 05/09/2023 1346   UROBILINOGEN 0.2 05/09/2023 1346   NITRITE negative 05/09/2023 1346   LEUKOCYTESUR Moderate (2+) (A) 05/09/2023 1346   Sepsis Labs Recent Labs  Lab 01/24/24 1143  WBC 6.8   Microbiology No results found for this or any previous visit (from the past 240 hours).   Total time spend on discharging this patient, including the last patient exam, discussing the hospital stay, instructions for ongoing care as it relates to all pertinent caregivers, as well as preparing the medical discharge records, prescriptions, and/or referrals as applicable, is 40 minutes.    Ellouise Haber, MD  [facility] 01/26/2024, 8:14 AM

## 2024-01-26 NOTE — Progress Notes (Signed)
 Mobility Specialist Progress Note:    01/26/24 1154  Mobility  Activity Ambulated with assistance  Level of Assistance Contact guard assist, steadying assist  Assistive Device None  Distance Ambulated (ft) 160 ft  Range of Motion/Exercises Active;All extremities  Activity Response Tolerated well  Mobility visit 1 Mobility  Mobility Specialist Start Time (ACUTE ONLY) 1141  Mobility Specialist Stop Time (ACUTE ONLY) 1153  Mobility Specialist Time Calculation (min) (ACUTE ONLY) 12 min   Pt received in chair, agreeable to mobility. Required CGA to stand and ambulate w no AD. Requires less assistance with RW. Tolerated well, asx throughout. Returned to chair, belongings in reach. All needs met.  Sherrilee Ditty Mobility Specialist Please contact via Special Educational Needs Teacher or  Rehab office at [phone]

## 2024-01-28 ENCOUNTER — Telehealth: Payer: Self-pay

## 2024-01-28 NOTE — Transitions of Care (Post Inpatient/ED Visit) (Signed)
 01/28/2024  Name: Rachel Whitehead MRN: [id number] DOB: 1955/12/05  Today's TOC FU Call Status: Today's TOC FU Call Status:: Successful TOC FU Call Completed TOC FU Call Complete Date: 01/28/24 Patient's Name and Date of Birth confirmed.  Transition Care Management Follow-up Telephone Call Date of Discharge: 01/26/24 Discharge Facility: [facility]) Type of Discharge: Inpatient Admission Primary Inpatient Discharge Diagnosis:: cerebral infarction How have you been since you were released from the hospital?: Better Any questions or concerns?: No  Items Reviewed: Did you receive and understand the discharge instructions provided?: No Medications obtained,verified, and reconciled?: Yes (Medications Reviewed) Any new allergies since your discharge?: No Dietary orders reviewed?: Yes Do you have support at home?: Yes People in Home [RPT]: child(ren), adult  Medications Reviewed Today: Medications Reviewed Today     Reviewed by Emmitt Pan, LPN (Licensed Practical Nurse) on 01/28/24 at 1543  Med List Status: <None>   Medication Order Taking? Sig Documenting Provider Last Dose Status Informant  acetaminophen  (TYLENOL ) 325 MG tablet [id number] Yes Take 975 mg by mouth every 6 (six) hours as needed for mild pain (pain score 1-3). [provider]  Active Self, Pharmacy Records  amLODipine  (NORVASC ) 2.5 MG tablet [id number] Yes Take 1 tablet (2.5 mg total) by mouth daily. Edman Marsa PARAS, DO  Active Self, Pharmacy Records  aspirin EC 81 MG tablet [id number] Yes Take 1 tablet (81 mg total) by mouth daily for 21 days. Awanda City, MD  Active   atorvastatin (LIPITOR) 80 MG tablet [id number] Yes Take 1 tablet (80 mg total) by mouth daily. Awanda City, MD  Active   clopidogrel (PLAVIX) 75 MG tablet [id number] Yes Take 1 tablet (75 mg total) by mouth daily. Awanda City, MD  Active   flecainide [facility]) 100 MG tablet [id number] Yes Take 100 mg by mouth 2 (two)  times daily. [provider]  Active Self, Pharmacy Records  furosemide  (LASIX ) 20 MG tablet [id number]  Take 20 mg by mouth daily.  Patient not taking: Reported on 01/28/2024   [provider]  Expired 01/25/24 2359 Self, Pharmacy Records  metoprolol succinate (TOPROL-XL) 25 MG 24 hr tablet [id number] Yes Take 12.5 mg by mouth daily. [provider]  Active Self, Pharmacy Records  Multiple Vitamin (MULTIVITAMIN) tablet [id number] Yes Take 1 tablet by mouth daily. [provider]  Active Self, Pharmacy Records  potassium chloride (KLOR-CON) 10 MEQ tablet [id number]  Take 10 mEq by mouth daily.  Patient not taking: Reported on 01/28/2024   [provider]  Expired 01/25/24 2359 Self, Pharmacy Records            Home Care and Equipment/Supplies: Were Home Health Services Ordered?: NA Any new equipment or medical supplies ordered?: NA  Functional Questionnaire: Do you need assistance with bathing/showering or dressing?: No Do you need assistance with meal preparation?: No Do you need assistance with eating?: No Do you have difficulty maintaining continence: No Do you need assistance with getting out of bed/getting out of a chair/moving?: No Do you have difficulty managing or taking your medications?: No  Follow up appointments reviewed: PCP Follow-up appointment confirmed?: Yes Date of PCP follow-up appointment?: 02/06/24 Follow-up Provider: [facility] Follow-up appointment confirmed?: No Follow-Up Specialty Provider:: neuro Reason Specialist Follow-Up Not Confirmed: Patient has Specialist Provider Number and will Call for Appointment Do you need transportation to your follow-up appointment?: No Do you understand care options if your condition(s) worsen?: Yes-patient verbalized understanding    SIGNATURE Pan Emmitt, LPN [facility] Nurse  Health Advisor Direct Dial [phone]

## 2024-01-29 ENCOUNTER — Telehealth: Payer: Self-pay

## 2024-01-29 NOTE — Telephone Encounter (Signed)
 Left message for patient to return call OK to advise    Per previous message from today by home health, they will begin PT/OT on 11/2

## 2024-01-29 NOTE — Telephone Encounter (Signed)
 Patient returned call back, I let her know about th pt/ot starting 11/ 2 but she says this isnt possible and wants a cb

## 2024-01-29 NOTE — Telephone Encounter (Signed)
 Copied from CRM [id number]. Topic: General - Other >> Jan 29, 2024  3:34 PM Myrick T wrote: Reason for CRM: Dorthea from [facility] called stated she will see patient for starter care of PT/OT on 11/2. She will call back with orders after the visit

## 2024-01-29 NOTE — Telephone Encounter (Signed)
 Copied from CRM [id number]. Topic: General - Other >> Jan 29, 2024  3:56 PM Fonda T wrote: Reason for CRM: Patient is calling inquiring on the status of home health care request.  Patient reports she was discharged from hospital on Saturday, an has not heard anything regarding Home Health.  Please call patient back to discuss further, can be reached at [phone]. Patient is requesting a return call today to inform further.

## 2024-01-30 NOTE — Progress Notes (Signed)
 Please see procedure note.

## 2024-01-30 NOTE — Procedures (Signed)
  Outpatient Procedure Note  DATE/TIME: 01/30/2024 9:55 AM  PRE-PROCEDURE DIAGNOSIS: Urge urinary incontinence  PROCEDURE: Posterior Tibial Nerve Stimulation  The risks and benefits of the procedure were reviewed and informed consent obtained. The patient received pre-procedure teaching and expressed understanding.  See separate timeout documentation.  PROCEDURE IN DETAIL: Pre-procedure pain score: 0   Settings:  Intensity set at low.  Frequency set at medium.  Fine frequency set to S.  Output dial set to: 6/10  Verbal and written consent for posterior tibial nerve stimulation was obtained. We discussed the risks of redness, bleeding, or pain at the needle insertion site or toe numbness. She denied the presence of an implantable defibrillator device or cardiac pacemaker  Today, patient reports: 12 daytime voids; 2  nighttime voids; and rates urinary urgency as 3 out of 4 (0=no urgency - 4=maximum urgency).  Reports 5 incontinence episodes per day.    Bladder symptoms: first session.  Procedure: 30 Minute Session # 1  The needle was placed after 1 attempt(s) in the right ankle.  She tolerated the procedure well and will return as scheduled for her next session. Post-procedure pain score: 0 The post-procedure instructions were reviewed with the patient, who expressed understanding. The patient does not have any barriers to learning.  Nurse Signature: DIANA SERO, RN  Physician/Provider Signature:  CALTON BONNITA GENTRY, NP

## 2024-01-30 NOTE — Telephone Encounter (Signed)
 Spoke with patient, she will call [facility] home [facility]. She has lots of questions for them.

## 2024-01-31 ENCOUNTER — Telehealth: Payer: Self-pay | Admitting: Family Medicine

## 2024-01-31 NOTE — Telephone Encounter (Signed)
 Copied from CRM [id number]. Topic: Referral - Question >> Jan 31, 2024  3:33 PM Tiffini S wrote: Reason for CRM: Patient called to schedule a referral with Neurology for Dr. Maree- they are seeing patient's in February 2026- patient needs a appointment in three weeks   Please contact the patient for update for a expedited sooner date at [phone]

## 2024-02-04 ENCOUNTER — Telehealth: Payer: Self-pay

## 2024-02-04 DIAGNOSIS — R29898 Other symptoms and signs involving the musculoskeletal system: Secondary | ICD-10-CM | POA: Insufficient documentation

## 2024-02-04 NOTE — Telephone Encounter (Signed)
 Copied from CRM #[id number]. Topic: Clinical - Home Health Verbal Orders >> Feb 04, 2024  8:59 AM Lonell PEDLAR wrote: Caller/Agency: Sonny, PT with [facility] Callback Number: [phone] Service Requested: Physical Therapy Frequency: 2 times a week, 1 week 1 time a week for 3 weeks Any new concerns about the patient? No

## 2024-02-04 NOTE — Telephone Encounter (Signed)
 Okay to proceed w/ verbal orders  Marsa Officer, DO [facility] 02/04/2024, 11:25 AM

## 2024-02-04 NOTE — Telephone Encounter (Signed)
 Spoke with Sonny verbal orders given

## 2024-02-05 NOTE — Telephone Encounter (Signed)
 I found this message on her chart, it looks like it was routed to the [facility] Admin pool incorrectly.  I also received an FMLA form for her family member, [name] (patient's daughter) for leave of absence due to her mother's health.  Rachel Whitehead was recently hospitalized 01/24/24 to 01/26/24 due to ischemic stroke event.  She has a HFU apt with me tomorrow on 02/06/24.  I will address the topics of her HFU, Neurology Referral and daughter's FMLA paperwork tomorrow  Marsa Officer, DO [facility] 02/05/2024, 7:14 PM

## 2024-02-06 ENCOUNTER — Ambulatory Visit: Admitting: Family Medicine

## 2024-02-06 ENCOUNTER — Encounter: Payer: Self-pay | Admitting: Family Medicine

## 2024-02-06 VITALS — BP 124/76 | HR 54 | Ht 64.0 in | Wt 178.1 lb

## 2024-02-06 DIAGNOSIS — I6930 Unspecified sequelae of cerebral infarction: Secondary | ICD-10-CM

## 2024-02-06 DIAGNOSIS — I4819 Other persistent atrial fibrillation: Secondary | ICD-10-CM

## 2024-02-06 DIAGNOSIS — I69351 Hemiplegia and hemiparesis following cerebral infarction affecting right dominant side: Secondary | ICD-10-CM

## 2024-02-06 NOTE — Patient Instructions (Addendum)
 Thank you for coming to the office today.  Keep on current therapy  Aspirin Plavix for 21 days, then only Plavix  Keep on Atorvastatin 80mg  for cholesterol and stroke prevention  We do recommend future Repatha injection for cholesterol / prevention but I would suggest bringing this issue up to the Neurologist next.  When your Home Health PT therapy is over, please contact us  ASAP and we can set up new referral for outpatient therapy and balance therapy.  Referral today to switch you from [facility] Maree over to [facility].  [facility]   Address: [address], [place], [place] [address] Hours: 8AM-5PM Phone: [phone]  FMLA / Paid Leave for Therisa for 3 months, pick up Monday  Please schedule a Follow-up Appointment to: Return if symptoms worsen or fail to improve.  If you have any other questions or concerns, please feel free to call the office or send a message through MyChart. You may also schedule an earlier appointment if necessary.  Additionally, you may be receiving a survey about your experience at our office within a few days to 1 week by e-mail or mail. We value your feedback.  Marsa Officer, DO [facility], [place]

## 2024-02-06 NOTE — Progress Notes (Unsigned)
 Subjective:    Patient ID: Rachel Whitehead, female    DOB: [birth year], 68 y.o.   MRN: [id number]  Rachel Whitehead is a 68 y.o. female presenting on 02/06/2024 for Hospitalization Follow-up   HPI  Discussed the use of AI scribe software for clinical note transcription with the patient, who gave verbal consent to proceed.  History of Present Illness     Rachel Whitehead is a 68 year old female with atrial fibrillation who presents for follow-up after a recent stroke. She is accompanied by her daughter Jyll Tomaro who has relocated down to [place]  from [place]  to help her mother as a caregiver at this time.   HOSPITAL FOLLOW-UP VISIT  Hospital/Location: [facility] Date of Admission: 01/24/24 Date of Discharge: 01/26/24 Transitions of care telephone call: Completed on 01/28/24 by Julian Lemmings LPN  Reason for Admission: Stroke  [facility] H&P and Discharge Summary have been reviewed - Patient presents today 11 days after recent hospitalization.  Cerebrovascular accident sequelae / Left Cerebellar ischemic stroke - Left cerebellar stroke occurred on January 24, 2024, presenting with sudden right leg weakness and complete loss of function initially - Gradual return of some right leg strength, but persistent difference in sensation and function compared to baseline - Difficulty with prolonged standing and wearing shoes due to right leg symptoms - Following discharge, she has been receiving physical therapy at home with plans to transition to outpatient therapy soon when [facility] finishes - Anticipates further improvement with continued therapy - She also has some residual brain fog, dizziness, lightheadedness, balance problems.  Persistent Atrial fibrillation and cardiac procedures - History of atrial fibrillation - Underwent cardioversion followed by ablation on January 15, 2024 - Currently taking metoprolol 12.5 mg once daily and flecainide for heart rate and rhythm  management  Antithrombotic therapy and adverse effects - Treated with aspirin and Plavix for 21 days post-stroke; aspirin discontinued, then will continue on Plavix - Significant bruising attributed to antithrombotic therapy  Dyslipidemia - Currently taking Lipitor 80 mg for elevated cholesterol - Recent LDL level of 143 prior to medication  Hypertension Controlled Current medications - Amlodipine  2.5mg  daily, Metoprolol XL 12.5mg  daily (half of 25)  Additional information  Patient's daughter, Therisa, is employed as a naval architect for [employer]. She is from [place]  and has taken leave from work to relocate down to [place] to provide care for her mother and serve as a primary caregiver right now. She is requesting FMLA paperwork completion. Her leave of absence was initiated on 01/29/24 and is expected to last duration of up to 3 months. She is needed to provide caregiver support to her mother with tasks of ADLs and iADLs, she is able to assist with medication administration, food preparation, daily hygiene, chores, cleaning, shopping, transportation to medical appointments and assisting her mother with managing her current significant illness.  I have reviewed the discharge medication list, and have reconciled the current and discharge medications today.   Current Outpatient Medications:    acetaminophen  (TYLENOL ) 325 MG tablet, Take 975 mg by mouth every 6 (six) hours as needed for mild pain (pain score 1-3)., Disp: , Rfl:    amLODipine  (NORVASC ) 2.5 MG tablet, Take 1 tablet (2.5 mg total) by mouth daily., Disp: 90 tablet, Rfl: 3   aspirin EC 81 MG tablet, Take 1 tablet (81 mg total) by mouth daily for 21 days., Disp: 21 tablet, Rfl: 0   atorvastatin (LIPITOR) 80 MG tablet, Take 1 tablet (80 mg total) by  mouth daily., Disp: 30 tablet, Rfl: 2   clopidogrel (PLAVIX) 75 MG tablet, Take 1 tablet (75 mg total) by mouth daily., Disp: 30 tablet, Rfl: 2   flecainide (TAMBOCOR) 100 MG tablet, Take  100 mg by mouth 2 (two) times daily., Disp: , Rfl:    metoprolol succinate (TOPROL-XL) 25 MG 24 hr tablet, Take 12.5 mg by mouth daily., Disp: , Rfl:    Multiple Vitamin (MULTIVITAMIN) tablet, Take 1 tablet by mouth daily., Disp: , Rfl:    furosemide  (LASIX ) 20 MG tablet, Take 20 mg by mouth daily. (Patient not taking: Reported on 02/06/2024), Disp: , Rfl:   ------------------------------------------------------------------------- Social History   Tobacco Use   Smoking status: Never  Vaping Use   Vaping status: Never Used  Substance Use Topics   Alcohol use: Yes    Alcohol/week: 4.0 standard drinks of alcohol    Types: 4 Standard drinks or equivalent per week   Drug use: Never    Review of Systems Per HPI unless specifically indicated above     Objective:    BP 124/76 (BP Location: Right Arm, Patient Position: Sitting, Cuff Size: Normal)   Pulse (!) 54   Ht 5' 4 (1.626 m)   Wt 178 lb 2 oz (80.8 kg)   SpO2 98%   BMI 30.58 kg/m   Wt Readings from Last 3 Encounters:  02/06/24 178 lb 2 oz (80.8 kg)  01/24/24 176 lb 9.6 oz (80.1 kg)  09/04/23 171 lb 6 oz (77.7 kg)    Physical Exam Vitals and nursing note reviewed.  Constitutional:      General: She is not in acute distress.    Appearance: She is well-developed. She is not diaphoretic.     Comments: Well-appearing, comfortable, cooperative  HENT:     Head: Normocephalic and atraumatic.  Eyes:     General:        Right eye: No discharge.        Left eye: No discharge.     Conjunctiva/sclera: Conjunctivae normal.  Neck:     Thyroid: No thyromegaly.  Cardiovascular:     Rate and Rhythm: Normal rate and regular rhythm.     Heart sounds: Normal heart sounds. No murmur heard. Pulmonary:     Effort: Pulmonary effort is normal. No respiratory distress.     Breath sounds: Normal breath sounds. No wheezing or rales.  Musculoskeletal:        General: Normal range of motion.     Cervical back: Normal range of motion and neck  supple.  Lymphadenopathy:     Cervical: No cervical adenopathy.  Skin:    General: Skin is warm and dry.     Findings: No erythema or rash.  Neurological:     Mental Status: She is alert and oriented to person, place, and time.     Cranial Nerves: No cranial nerve deficit.     Motor: Weakness (Right lower extremity 4/5 dorsiflexion and hip flex extension. mild reduced sensation lower extremity.) present.  Psychiatric:        Behavior: Behavior normal.     Comments: Well groomed, good eye contact, normal speech and thoughts      Results for orders placed or performed during the hospital encounter of 01/24/24  CBG monitoring, ED   Collection Time: 01/24/24 11:00 AM  Result Value Ref Range   Glucose-Capillary 88 70 - 99 mg/dL   Comment 1 Notify RN    Comment 2 Document in Chart   Protime-INR  Collection Time: 01/24/24 11:43 AM  Result Value Ref Range   Prothrombin Time 13.3 11.4 - 15.2 seconds   INR 1.0 0.8 - 1.2  APTT   Collection Time: 01/24/24 11:43 AM  Result Value Ref Range   aPTT 31 24 - 36 seconds  CBC   Collection Time: 01/24/24 11:43 AM  Result Value Ref Range   WBC 6.8 4.0 - 10.5 K/uL   RBC 4.44 3.87 - 5.11 MIL/uL   Hemoglobin 13.1 12.0 - 15.0 g/dL   HCT 59.7 63.9 - 53.9 %   MCV 90.5 80.0 - 100.0 fL   MCH 29.5 26.0 - 34.0 pg   MCHC 32.6 30.0 - 36.0 g/dL   RDW 86.7 88.4 - 84.4 %   Platelets 375 150 - 400 K/uL   nRBC 0.0 0.0 - 0.2 %  Differential   Collection Time: 01/24/24 11:43 AM  Result Value Ref Range   Neutrophils Relative % 63 %   Neutro Abs 4.3 1.7 - 7.7 K/uL   Lymphocytes Relative 25 %   Lymphs Abs 1.7 0.7 - 4.0 K/uL   Monocytes Relative 8 %   Monocytes Absolute 0.5 0.1 - 1.0 K/uL   Eosinophils Relative 3 %   Eosinophils Absolute 0.2 0.0 - 0.5 K/uL   Basophils Relative 1 %   Basophils Absolute 0.0 0.0 - 0.1 K/uL   Immature Granulocytes 0 %   Abs Immature Granulocytes 0.03 0.00 - 0.07 K/uL  Comprehensive metabolic panel   Collection Time:  01/24/24 11:43 AM  Result Value Ref Range   Sodium 134 (L) 135 - 145 mmol/L   Potassium 4.6 3.5 - 5.1 mmol/L   Chloride 101 98 - 111 mmol/L   CO2 20 (L) 22 - 32 mmol/L   Glucose, Bld 92 70 - 99 mg/dL   BUN 22 8 - 23 mg/dL   Creatinine, Ser 9.16 0.44 - 1.00 mg/dL   Calcium 8.8 (L) 8.9 - 10.3 mg/dL   Total Protein 7.7 6.5 - 8.1 g/dL   Albumin 3.8 3.5 - 5.0 g/dL   AST 29 15 - 41 U/L   ALT 17 0 - 44 U/L   Alkaline Phosphatase 69 38 - 126 U/L   Total Bilirubin 2.0 (H) 0.0 - 1.2 mg/dL   GFR, Estimated >39 >39 mL/min   Anion gap 13 5 - 15  Ethanol   Collection Time: 01/24/24 11:43 AM  Result Value Ref Range   Alcohol, Ethyl (B) <15 <15 mg/dL  Hemoglobin J8r   Collection Time: 01/24/24 11:43 AM  Result Value Ref Range   Hgb A1c MFr Bld 5.0 4.8 - 5.6 %   Mean Plasma Glucose 96.8 mg/dL  HIV Antibody (routine testing w rflx)   Collection Time: 01/25/24  3:50 AM  Result Value Ref Range   HIV Screen 4th Generation wRfx Non Reactive Non Reactive  Lipid panel   Collection Time: 01/25/24  3:50 AM  Result Value Ref Range   Cholesterol 212 (H) 0 - 200 mg/dL   Triglycerides 62 <849 mg/dL   HDL 57 >59 mg/dL   Total CHOL/HDL Ratio 3.7 RATIO   VLDL 12 0 - 40 mg/dL   LDL Cholesterol 856 (H) 0 - 99 mg/dL  ECHOCARDIOGRAM COMPLETE   Collection Time: 01/25/24  9:15 AM  Result Value Ref Range   Weight 2,825.6 oz   Height 64 in   BP 105/56 mmHg   Ao pk vel 1.57 m/s   AV Area VTI 2.76 cm2   AR max vel 2.58  cm2   AV Mean grad 5.0 mmHg   AV Peak grad 9.9 mmHg   Single Plane A2C EF 61.3 %   Single Plane A4C EF 66.0 %   Calc EF 60.3 %   S' Lateral 1.80 cm   AV Area mean vel 2.63 cm2   Area-P 1/2 3.27 cm2   MV VTI 2.62 cm2   Est EF 60 - 65%       Assessment & Plan:   Problem List Items Addressed This Visit     Hemiparesis affecting right side as late effect of cerebrovascular accident [facility])   Relevant Orders   Ambulatory referral to Neurology   History of cerebrovascular accident  (CVA) with residual deficit - Primary   Relevant Orders   Ambulatory referral to Neurology   Persistent atrial fibrillation [facility])   Relevant Orders   Ambulatory referral to Neurology     History of stroke, L cerebellar ischemic infarct with residual deficit (Hemiparesis R lower extremity) Sequelae post stroke, improving. Also has some cognitive / systemic residual deficits with some brain fog, dizziness, balance instability  Hospital Follow-up record reviewed in detail. Acute right leg hemiparesis post-left cerebellar infarction with gradual improvement. Persistent dizziness and balance issues likely due to stroke. - Finish DAPT ASA + Plavix 21 days (end date 02/14/24) then starting on 02/15/24 discontinue Aspirin and proceed w/ Plavix monotherapy - Hospital referred to Dr Maree [facility], however patient/family were unsuccessful in scheduling prompt hospital follow-up - Today I have submitted new urgent referral to [facility] for HFU - Expedite neurology appointment. - Continue home physical therapy, transition to outpatient therapy post-discharge. She will contact our office once completed HH and I can place new orders for ambulatory PT post stroke and balance therapy as indicated  Persistent Atrial fibrillation S/p Watchman procedure, MAZE, Cardioversions in past Managed by [facility] EP Failed anticoagulation in past w Xarelto  due to bleeding complications Recent cardiac ablation 01/15/24,1 week prior to stroke On flecainide for rhythm control, and metoprolol for rate control. - Continue flecainide. - Continue metoprolol. - Plan for ablation completion.  Hyperlipidemia LDL cholesterol elevated. Now on atorvastatin high intensity for management and stroke prevention.  Future switch to Repatha discussed, deferred until neurology consultation. - Continue atorvastatin 80 mg daily. - Discuss add on vs switch to Repatha [id number] with  neurologist post-consultation.  Hypertension Blood pressure well-controlled with metoprolol and amlodipine . - Continue metoprolol and amlodipine .      Family member FMLA Continuous Leave  Patient's daughter, Rushie Brazel works as a naval architect for [employer] in [place]. She has relocated to [place] to provide caregiver support to her mother after hospitalization / stroke.  Leave start date 01/29/24 Estimated leave end date 04/23/24 (12 weeks total)  She is needed to provide caregiver support to her mother with tasks of ADLs and iADLs, she is able to assist with medication administration, food preparation, daily hygiene, chores, cleaning, shopping, transportation to medical appointments and assisting her mother with managing her current significant illness.  Patient / daughter will be notified when paperwork is completed for pick up. She will submit her paperwork.   Contact Nikki regarding route referral expedited to [facility] post stroke hospital follow-up instead of [facility]   No orders of the defined types were placed in this encounter.   Follow up plan: Return if symptoms worsen or fail to improve.   Marsa Officer, DO [facility] 02/06/2024, 11:16 AM

## 2024-02-07 ENCOUNTER — Telehealth: Payer: Self-pay | Admitting: Family Medicine

## 2024-02-07 DIAGNOSIS — Z0279 Encounter for issue of other medical certificate: Secondary | ICD-10-CM

## 2024-02-07 NOTE — Telephone Encounter (Signed)
 FYI I attempted to call patient but did not reach her. If she calls back please let her know:  I have signed her referral to [facility]. Once the Neurologist has an opportunity to review the referral and work on scheduling they should contact her.  Also I have completed her daughter's FMLA paperwork and it is ready for pick up. Her daughter, Therisa, was planning to pick it up Monday 11/10.  Marsa Officer, DO [facility] 02/07/2024, 3:04 PM

## 2024-02-08 NOTE — Telephone Encounter (Signed)
 Patient notified of referral stated she has an appointment for next week. Also paperwork is complete

## 2024-02-13 ENCOUNTER — Encounter: Payer: Self-pay | Admitting: Neurology

## 2024-02-13 ENCOUNTER — Telehealth: Payer: Self-pay | Admitting: Neurology

## 2024-02-13 ENCOUNTER — Ambulatory Visit: Admitting: Neurology

## 2024-02-13 VITALS — BP 159/95 | HR 79 | Ht 64.0 in | Wt 178.2 lb

## 2024-02-13 DIAGNOSIS — Z95818 Presence of other cardiac implants and grafts: Secondary | ICD-10-CM

## 2024-02-13 DIAGNOSIS — I63412 Cerebral infarction due to embolism of left middle cerebral artery: Secondary | ICD-10-CM | POA: Diagnosis not present

## 2024-02-13 DIAGNOSIS — I482 Chronic atrial fibrillation, unspecified: Secondary | ICD-10-CM

## 2024-02-13 DIAGNOSIS — E782 Mixed hyperlipidemia: Secondary | ICD-10-CM

## 2024-02-13 DIAGNOSIS — R29898 Other symptoms and signs involving the musculoskeletal system: Secondary | ICD-10-CM

## 2024-02-13 MED ORDER — APIXABAN 5 MG PO TABS: 5.0000 mg | ORAL_TABLET | Freq: Two times a day (BID) | ORAL | 3 refills | Status: DC

## 2024-02-13 NOTE — Telephone Encounter (Signed)
 Faxed to [facility]   Phone [phone] Fax [phone]

## 2024-02-13 NOTE — Patient Instructions (Signed)
 I had a Rachel Whitehead d/w patient and her daughter about her  recent embolic stroke, atrial fibrillation and watchman device,risk for recurrent stroke/TIAs, personally independently reviewed imaging studies and stroke evaluation results and answered questions.I recommend she discontinue aspirin and Plavix and instead switch to Eliquis for secondary stroke prevention   for secondary stroke prevention and maintain strict control of hypertension with blood pressure goal below 130/90, diabetes with hemoglobin A1c goal below 6.5% and lipids with LDL cholesterol goal below 70 mg/dL.  I discussed risk-benefit of anticoagulation and since she has failed a Watchman device we have no choice but to try anticoagulation.  If she has recurrent hemorrhage may need to be considered.  I also advised the patient to eat a healthy diet with plenty of whole grains, cereals, fruits and vegetables, exercise regularly and maintain ideal body weight I also recommend doing a TEE to evaluate the Watchman device for presence of thrombus or leakage.  Patient will prefer to get this done at [facility] where she had her Watchman device..  Followup in the future with me in 6 to 8 months or call earlier if necessary.  Stroke Prevention Some medical conditions and behaviors can lead to a higher chance of having a stroke. You can help prevent a stroke by eating healthy, exercising, not smoking, and managing any medical conditions you have. Stroke is a leading cause of functional impairment. Primary prevention is particularly important because a majority of strokes are first-time events. Stroke changes the lives of not only those who experience a stroke but also their family and other caregivers. How can this condition affect me? A stroke is a medical emergency and should be treated right away. A stroke can lead to brain damage and can sometimes be life-threatening. If a person gets medical treatment right away, there is a better chance of surviving and  recovering from a stroke. What can increase my risk? The following medical conditions may increase your risk of a stroke: Cardiovascular disease. High blood pressure (hypertension). Diabetes. High cholesterol. Sickle cell disease. Blood clotting disorders (hypercoagulable state). Obesity. Sleep disorders (obstructive sleep apnea). Other risk factors include: Being older than age 51. Having a history of blood clots, stroke, or mini-stroke (transient ischemic attack, TIA). Genetic factors, such as race, ethnicity, or a family history of stroke. Smoking cigarettes or using other tobacco products. Taking birth control pills, especially if you also use tobacco. Heavy use of alcohol or drugs, especially cocaine and methamphetamine. Physical inactivity. What actions can I take to prevent this? Manage your health conditions High cholesterol levels. Eating a healthy diet is important for preventing high cholesterol. If cholesterol cannot be managed through diet alone, you may need to take medicines. Take any prescribed medicines to control your cholesterol as told by your health care provider. Hypertension. To reduce your risk of stroke, try to keep your blood pressure below 130/80. Eating a healthy diet and exercising regularly are important for controlling blood pressure. If these steps are not enough to manage your blood pressure, you may need to take medicines. Take any prescribed medicines to control hypertension as told by your health care provider. Ask your health care provider if you should monitor your blood pressure at home. Have your blood pressure checked every year, even if your blood pressure is normal. Blood pressure increases with age and some medical conditions. Diabetes. Eating a healthy diet and exercising regularly are important parts of managing your blood sugar (glucose). If your blood sugar cannot be managed through diet  and exercise, you may need to take medicines. Take  any prescribed medicines to control your diabetes as told by your health care provider. Get evaluated for obstructive sleep apnea. Talk to your health care provider about getting a sleep evaluation if you snore a lot or have excessive sleepiness. Make sure that any other medical conditions you have, such as atrial fibrillation or atherosclerosis, are managed. Nutrition Follow instructions from your health care provider about what to eat or drink to help manage your health condition. These instructions may include: Reducing your daily calorie intake. Limiting how much salt (sodium) you use to 1,500 milligrams (mg) each day. Using only healthy fats for cooking, such as olive oil, canola oil, or sunflower oil. Eating healthy foods. You can do this by: Choosing foods that are high in fiber, such as whole grains, and fresh fruits and vegetables. Eating at least 5 servings of fruits and vegetables a day. Try to fill one-half of your plate with fruits and vegetables at each meal. Choosing lean protein foods, such as lean cuts of meat, poultry without skin, fish, tofu, beans, and nuts. Eating low-fat dairy products. Avoiding foods that are high in sodium. This can help lower blood pressure. Avoiding foods that have saturated fat, trans fat, and cholesterol. This can help prevent high cholesterol. Avoiding processed and prepared foods. Counting your daily carbohydrate intake.  Lifestyle If you drink alcohol: Limit how much you have to: 0-1 drink a day for women who are not pregnant. 0-2 drinks a day for men. Know how much alcohol is in your drink. In the [place] one drink equals one 12 oz bottle of beer ( ), one 5 oz glass of wine ( ), or one 1 oz glass of hard liquor (44mL). Do not use any products that contain nicotine or tobacco. These products include cigarettes, chewing tobacco, and vaping devices, such as e-cigarettes. If you need help quitting, ask your health care provider. Avoid  secondhand smoke. Do not use drugs. Activity  Try to stay at a healthy weight. Get at least 30 minutes of exercise on most days, such as: Fast walking. Biking. Swimming. Medicines Take over-the-counter and prescription medicines only as told by your health care provider. Aspirin or blood thinners (antiplatelets or anticoagulants) may be recommended to reduce your risk of forming blood clots that can lead to stroke. Avoid taking birth control pills. Talk to your health care provider about the risks of taking birth control pills if: You are over 57 years old. You smoke. You get very bad headaches. You have had a blood clot. Where to find more information American Stroke Association: www.strokeassociation.org Get help right away if: You or a loved one has any symptoms of a stroke. BE FAST is an easy way to remember the main warning signs of a stroke: B - Balance. Signs are dizziness, sudden trouble walking, or loss of balance. E - Eyes. Signs are trouble seeing or a sudden change in vision. F - Face. Signs are sudden weakness or numbness of the face, or the face or eyelid drooping on one side. A - Arms. Signs are weakness or numbness in an arm. This happens suddenly and usually on one side of the body. S - Speech. Signs are sudden trouble speaking, slurred speech, or trouble understanding what people say. T - Time. Time to call emergency services. Write down what time symptoms started. You or a loved one has other signs of a stroke, such as: A sudden, severe headache with no known cause.  Nausea or vomiting. Seizure. These symptoms may represent a serious problem that is an emergency. Do not wait to see if the symptoms will go away. Get medical help right away. Call your local emergency services (911 in the [place] Do not drive yourself to the hospital. Summary You can help to prevent a stroke by eating healthy, exercising, not smoking, limiting alcohol intake, and managing any medical  conditions you may have. Do not use any products that contain nicotine or tobacco. These include cigarettes, chewing tobacco, and vaping devices, such as e-cigarettes. If you need help quitting, ask your health care provider. Remember BE FAST for warning signs of a stroke. Get help right away if you or a loved one has any of these signs. This information is not intended to replace advice given to you by your health care provider. Make sure you discuss any questions you have with your health care provider. Document Revised: 02/20/2022 Document Reviewed: 02/20/2022 Elsevier Patient Education  2024 [employer].

## 2024-02-13 NOTE — Progress Notes (Addendum)
 [facility] [address] [place].  [address] [phone]       OFFICE CONSULT NOTE  Ms. Rachel Whitehead Date of Birth:  Mar 18, 1956 Medical Record Number:  [id number]   Referring MD: Marolyn Officer  Reason for Referral: Stroke  HPI: Rachel Whitehead is a 68 year old [place] Caucasian lady seen today for initial office consultation visit for stroke.  She is accompanied by her daughter.  History is obtained from them and review of electronic medical records and I personally reviewed pertinent available imaging films in PACS.  She has past medical history of paroxysmal A-fib status post cardioversion, recent ablation, maze and Watchman procedure at [facility] with poor tolerance to anticoagulation in the past due to recurrent severe hematuria and prior history of stroke affecting left eye.  Hypertension and urinary incontinence and hematuria.  She presented on 01/24/2024 with sudden onset of right leg weakness and numbness with inability to move it.  She initially had some subtle right lower facial weakness as well and after discussion of risk benefits of IV TNK her symptoms dramatically improved before medication could be given.  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  MRI scan showed a small left precentral gyrus as well as subacute left cerebellar embolic infarcts.  Echocardiogram showed ejection fraction of 60 to 65%.  LDL cholesterol 143 mg percent.  Hemoglobin A1c was 5.0.  Patient had a Watchman device done previously at [facility] for history of recurrent hematuria and GI bleed.  She had infected and undergone a convergent cardioversion ablation procedure as well.  She was on aspirin and Plavix was added.  Patient states she has done well since then.  She is tolerating both medications well without bruising or bleeding or hematuria.  She has had no further recurrent stroke or TIA symptoms.  She is not sure whether she previously had hemorrhage on Xarelto  or Eliquis  but is willing to try Eliquis given lack of any alternative treatment option at the present time.  She plans to make an appointment to see a cardiologist at [facility] to undergo a TEE for evaluation of the Watchman device for any thrombus or leakage.  ROS:   14 system review of systems is positive for weakness, numbness, walking difficulty, hematuria and GI bleeding all other systems negative  PMH:  Past Medical History:  Diagnosis Date   Hypertension    Urinary incontinence     Social History:  Social History   Socioeconomic History   Marital status: Single    Spouse name: Not on file   Number of children: Not on file   Years of education: Not on file   Highest education level: Not on file  Occupational History   Not on file  Tobacco Use   Smoking status: Never   Smokeless tobacco: Not on file  Vaping Use   Vaping status: Never Used  Substance and Sexual Activity   Alcohol use: Yes    Alcohol/week: 4.0 standard drinks of alcohol    Types: 4 Standard drinks or equivalent per week   Drug use: Never   Sexual activity: Not on file  Other Topics Concern   Not on file  Social History Narrative   Not on file   Social Drivers of Health   Financial Resource Strain: Low Risk  (06/19/2023)   Received from [facility]   Overall Financial Resource Strain (CARDIA)    Difficulty of Paying Living Expenses: Not hard at all  Food  Insecurity: No Food Insecurity (01/24/2024)   Hunger Vital Sign    Worried About Running Out of Food in the Last Year: Never true    Ran Out of Food in the Last Year: Never true  Transportation Needs: No Transportation Needs (01/24/2024)   PRAPARE - Administrator, Civil Service (Medical): No    Lack of Transportation (Non-Medical): No  Physical Activity: Sufficiently Active (03/05/2023)   Exercise Vital Sign    Days of Exercise per Week: 4 days    Minutes of Exercise per Session: 60 min  Stress: No Stress Concern Present  (03/05/2023)   [employer] of Occupational Health - Occupational Stress Questionnaire    Feeling of Stress : Only a little  Social Connections: Moderately Isolated (01/24/2024)   Social Connection and Isolation Panel    Frequency of Communication with Friends and Family: More than three times a week    Frequency of Social Gatherings with Friends and Family: More than three times a week    Attends Religious Services: 1 to 4 times per year    Active Member of [employer] or Organizations: No    Attends Banker Meetings: Never    Marital Status: Divorced  Catering Manager Violence: Not At Risk (01/24/2024)   Humiliation, Afraid, Rape, and Kick questionnaire    Fear of Current or Ex-Partner: No    Emotionally Abused: No    Physically Abused: No    Sexually Abused: No    Medications:   Current Outpatient Medications on File Prior to Visit  Medication Sig Dispense Refill   amLODipine  (NORVASC ) 2.5 MG tablet Take 1 tablet (2.5 mg total) by mouth daily. 90 tablet 3   atorvastatin (LIPITOR) 80 MG tablet Take 1 tablet (80 mg total) by mouth daily. 30 tablet 2   clopidogrel (PLAVIX) 75 MG tablet Take 1 tablet (75 mg total) by mouth daily. 30 tablet 2   flecainide (TAMBOCOR) 100 MG tablet Take 100 mg by mouth 2 (two) times daily.     metoprolol succinate (TOPROL-XL) 25 MG 24 hr tablet Take 12.5 mg by mouth daily.     Multiple Vitamin (MULTIVITAMIN) tablet Take 1 tablet by mouth daily.     acetaminophen  (TYLENOL ) 325 MG tablet Take 975 mg by mouth every 6 (six) hours as needed for mild pain (pain score 1-3). (Patient not taking: Reported on 02/13/2024)     furosemide  (LASIX ) 20 MG tablet Take 20 mg by mouth daily. (Patient not taking: Reported on 02/13/2024)     No current facility-administered medications on file prior to visit.    Allergies:  No Known Allergies  Physical Exam General: well developed, well nourished, seated, in no evident distress Head: head normocephalic and  atraumatic.   Neck: supple with no carotid or supraclavicular bruits Cardiovascular: regular rate and rhythm, no murmurs Musculoskeletal: no deformity Skin:  no rash/petichiae Vascular:  Normal pulses all extremities  Neurologic Exam Mental Status: Awake and fully alert. Oriented to place and time. Recent and remote memory intact. Attention span, concentration and fund of knowledge appropriate. Mood and affect appropriate.  Cranial Nerves: Fundoscopic exam reveals sharp disc margins. Pupils equal, briskly reactive to light. Extraocular movements full without nystagmus. Visual fields full to confrontation. Hearing intact. Facial sensation intact. Face, tongue, palate moves normally and symmetrically.  Motor: Normal bulk and tone. Normal strength in all tested extremity muscles. Sensory.:  Diminished touch pinprick sensation in the right lower extremity but intact position vibration sensation.  Coordination: Rapid alternating  movements normal in all extremities. Finger-to-nose and heel-to-shin performed accurately bilaterally. Gait and Station: Arises from chair without difficulty. Stance is normal. Gait demonstrates normal stride length and balance . Able to heel, toe and tandem walk with slight difficulty.  Reflexes: 1+ and symmetric. Toes downgoing.   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  She is doing well with only mild residual right leg  numbness     PLAN:I had a [place] d/w patient and her daughter about her  recent embolic stroke, atrial fibrillation and watchman device,risk for recurrent stroke/TIAs, personally independently reviewed imaging studies and stroke evaluation results and answered questions.I recommend she discontinue aspirin and Plavix and instead switch to Eliquis for secondary stroke prevention   for secondary stroke prevention and maintain strict control of hypertension with blood pressure goal below 130/90, diabetes with hemoglobin A1c goal below 6.5% and lipids with LDL cholesterol goal below 70 mg/dL.  I discussed risk-benefit of anticoagulation and since she has failed a Watchman device we have no choice but to try anticoagulation.  If she has recurrent hemorrhage may need to consider stopping it.  Hopefully in the future we may have better options like factor XI inhibitors which have much less risk of bleeding..  I also advised the patient to eat a healthy diet with plenty of whole grains, cereals, fruits and vegetables, exercise regularly and maintain ideal body weight I also recommend doing a TEE to evaluate the Watchman device for presence of thrombus or leakage.  Patient will prefer to get this done at [facility] where she had her Watchman device..  Followup in the future with me in 6 to 8 months or call earlier if necessary.   I personally spent a total of 50 minutes in the care of the patient today including getting/reviewing separately obtained history, performing a medically appropriate exam/evaluation, counseling and educating, placing orders, referring and communicating with other health care professionals, documenting clinical information in the EHR, independently interpreting results, and coordinating care.       Eather Popp, MD  Note: This document was prepared with digital dictation and possible smart phrase technology. Any transcriptional errors that result from this process are unintentional.

## 2024-02-13 NOTE — Telephone Encounter (Signed)
 Pt called to follow up about order for Echo Tee  beign sent to [facility] . Informed I do see order in  our system  But will inform MD   ,

## 2024-02-14 ENCOUNTER — Telehealth: Payer: Self-pay | Admitting: Neurology

## 2024-02-14 DIAGNOSIS — I6930 Unspecified sequelae of cerebral infarction: Secondary | ICD-10-CM

## 2024-02-14 DIAGNOSIS — Z95818 Presence of other cardiac implants and grafts: Secondary | ICD-10-CM

## 2024-02-14 DIAGNOSIS — I482 Chronic atrial fibrillation, unspecified: Secondary | ICD-10-CM

## 2024-02-14 NOTE — Addendum Note (Signed)
 Addended by: EDMAN MARSA PARAS on: 02/14/2024 05:45 PM   Modules accepted: Orders

## 2024-02-14 NOTE — Telephone Encounter (Unsigned)
 Copied from CRM [id number]. Topic: Referral - Question >> Feb 14, 2024 12:50 PM Montie POUR wrote: Reason for CRM:  Ms. Puthoff would like for Dr. Edman to refer her to a different Neurologic Office. Please call her at [phone] to discuss. She wants to get a second opinion.

## 2024-02-14 NOTE — Telephone Encounter (Signed)
 Dr. Rosemarie -what do you recommend about pt medications?

## 2024-02-14 NOTE — Telephone Encounter (Signed)
 I'm linked into this phone message as well, as you can see it looks like she is already contacting us  about a new referral and 2nd opinion. I will defer the initial response to Dr Rosemarie, but my first reaction was to inquire if she received pharmacy rx coverage assistance for Eliquis? She may be eligible for cost savings program.  I typically would refer to our clinical pharmacist who can assist with this case to explore Eliquis coverage costs and options. It sounds like cost is the main barrier to her proceeding with the treatment plan.  Do you have a clinical pharmacist linked with [facility]? Or should I connect our clinical pharmacist in with a referral now?  Also I called the patient directly myself and she said that she would also consider Xarelto . She was on this in the past prior to [facility] Procedure. So, potentially alternate anticoagulant if more cost effective or utilize clinical pharmacist for savings.  Thank you - please let me know how else I can help. I told her that I would not recommend 2nd opinion so hastily and she should wait on further reply on this matter from your office.  Marsa Officer, DO [facility] 02/14/2024, 2:51 PM

## 2024-02-14 NOTE — Telephone Encounter (Signed)
 She called and said [facility] didn't receive the referral. The fax number she gave me to send it to is [phone]. Order sent

## 2024-02-14 NOTE — Telephone Encounter (Signed)
 Patient is wondering if she can stay on her Asprin and Plavix instead of switching to Eliquis because the Eliquis is very expensive. Please call her back thanks

## 2024-02-14 NOTE — Telephone Encounter (Addendum)
 Understood. Thank you Dr Rosemarie. I have signed a referral order for our clinical pharmacy and I will coordinate soon with our pharmacist Sharyle Sia [facility] on this case in conjunction with your office.  I see that the TEE at [facility] is in process at this point.  Keep us  in the loop on however we can assist in coordination and communication with patient / daughter as well.  Marsa Officer, DO [facility] 02/14/2024, 5:44 PM

## 2024-02-15 ENCOUNTER — Telehealth: Payer: Self-pay | Admitting: Pharmacist

## 2024-02-15 ENCOUNTER — Encounter: Payer: Self-pay | Admitting: Pharmacist

## 2024-02-15 ENCOUNTER — Telehealth: Payer: Self-pay

## 2024-02-15 ENCOUNTER — Other Ambulatory Visit: Admitting: Pharmacist

## 2024-02-15 DIAGNOSIS — I4819 Other persistent atrial fibrillation: Secondary | ICD-10-CM

## 2024-02-15 NOTE — Progress Notes (Signed)
   Outreach Note  02/15/2024 Name: Rachel Whitehead MRN: [id number] DOB: December 07, 1955  Referred by: Edman Marsa PARAS, DO  Was unable to reach patient via telephone today and have left HIPAA compliant voicemail asking patient to return my call.    Sharyle Sia, PharmD, JAQUELINE, CPP Clinical Pharmacist [facility] [phone]

## 2024-02-15 NOTE — Telephone Encounter (Signed)
 PAP: Patient assistance application for Xarelto  through [employer] has been mailed to pt's home address on file. Provider portion of application will be faxed to provider's office. Patient portion was e-mailed as requested.

## 2024-02-15 NOTE — Progress Notes (Signed)
 02/15/2024 Name: Rachel Whitehead MRN: [id number] DOB: [birth year]  Chief Complaint  Patient presents with   Medication Assistance    Jadae Steinke is a 68 y.o. year old female who presented for a telephone visit.   They were referred to the pharmacist by their PCP for assistance in managing medication access.    Subjective:  Care Team: Primary Care Provider: Edman Marsa PARAS, DO ; Next Scheduled Visit: 03/21/2024 Cardiologist: Dewane Shiner, DO; Next Scheduled Visit: 03/12/2024  Neurologist: Rosemarie Eather RAMAN, MD Urologist: Manuelita Harlene Houston, NP; Next Scheduled Visit: 02/17/2024  Medication Access/Adherence  Current Pharmacy:  [id number] - [place], [place] - [address] [place] [address] Phone: [phone] Fax: [phone]  [facility] [address] [place] [address] Phone: [phone] Fax: [phone]   Patient reports affordability concerns with their medications: Yes  Patient reports access/transportation concerns to their pharmacy: No  Patient reports adherence concerns with their medications:  No    From review of chart, note patient seen by Neurologist on 02/13/2024 for initial office consultation visit related to stroke. Dr. Rosemarie advised patient: - Discontinue aspirin and Plavix and instead switch to Eliquis for secondary stroke prevention  - Maintain strict control of hypertension with blood pressure goal below 130/90, diabetes with hemoglobin A1c goal below 6.5% and lipids with LDL cholesterol goal below 70 mg/dL  - A TEE to evaluate the Watchman device for presence of thrombus or leakage.  Patient preference to get this done at [facility] where she had her Watchman device   Today patient reports that she has picked up a 1 month supply of Eliquis for 2025, but that this cost is unaffordable [place] term   Atrial Fibrillation s/p Watchman Device (placed 06/2023)/ Stroke  Prevention:  Patient followed by [facility] Cardiology & Electrophysiology  Patient admitted to [facility] on 01/15/2024 for Convergent Maze procedure  Admitted to [facility] 01/24/2024 to 01/26/2024 related to stroke  Current medications: Rate Control: metoprolol ER 25 mg - 1/2 tablet (12.5 mg) daily Rhythm Control: flecainide 100 mg twice daily Anticoagulation Regimen: Eliquis 5 mg twice daily (plans to start today). Reports has discontinued aspirin and clopidogrel as recommended by Neurologist  Medications tried in the past: Xarelto , Eliquis  Statin Therapy: atorvastatin 80 mg daily (started 01/26/2024)  Uses upper arm monitor Current blood pressure/heart rate readings: Last checked: last night: 134/79, HR 49   Objective:  Lab Results  Component Value Date   CREATININE 0.83 01/24/2024   BUN 22 01/24/2024   NA 134 (L) 01/24/2024   K 4.6 01/24/2024   CL 101 01/24/2024   CO2 20 (L) 01/24/2024    Lab Results  Component Value Date   CHOL 212 (H) 01/25/2024   HDL 57 01/25/2024   LDLCALC 143 (H) 01/25/2024   TRIG 62 01/25/2024   CHOLHDL 3.7 01/25/2024   BP Readings from Last 3 Encounters:  02/13/24 (!) 159/95  02/06/24 124/76  01/26/24 123/71   Pulse Readings from Last 3 Encounters:  02/13/24 79  02/06/24 (!) 54  01/26/24 (!) 55     Medications Reviewed Today     Reviewed by Alana Sharyle LABOR, RPH-CPP (Pharmacist) on 02/15/24 at 1220  Med List Status: <None>   Medication Order Taking? Sig Documenting Provider Last Dose Status Informant  acetaminophen  (TYLENOL ) 325 MG tablet [id number] Yes Take 975 mg by mouth every 6 (six) hours as needed for mild pain (pain score  1-3).  Patient taking differently: Take 650 mg by mouth daily as needed for mild pain (pain score 1-3).   [provider]  Active Self, Pharmacy Records  amLODipine  (NORVASC ) 2.5 MG tablet [id number] Yes Take 1 tablet (2.5 mg total) by mouth daily.  Edman Marsa PARAS, DO  Active Self, Pharmacy Records  apixaban (ELIQUIS) 5 MG TABS tablet [id number] Yes Take 1 tablet (5 mg total) by mouth 2 (two) times daily. Sethi, Pramod S, MD  Active   atorvastatin (LIPITOR) 80 MG tablet [id number] Yes Take 1 tablet (80 mg total) by mouth daily. Awanda [place], MD  Active    Patient not taking:   Discontinued 02/15/24 1022 (Change in therapy)   flecainide (TAMBOCOR) 100 MG tablet [id number] Yes Take 100 mg by mouth 2 (two) times daily. [provider]  Active Self, Pharmacy Records    Discontinued 02/15/24 1217 (Completed Course) metoprolol succinate (TOPROL-XL) 25 MG 24 hr tablet [id number] Yes Take 12.5 mg by mouth daily. [provider]  Active Self, Pharmacy Records  Multiple Vitamin (MULTIVITAMIN) tablet [id number] Yes Take 1 tablet by mouth daily. [provider]  Active Self, Pharmacy Records              Assessment/Plan:   Patient confirms that she has called and will call again this today to [facility] to request to be scheduled for a TEE as soon as possible to evaluate the Watchman device for presence of thrombus or leakage, as recommended by Neurologist  Per review of [employer] website, for 2025 calendar year, patient's plan has a $0 annual prescription deductible and tier 3 copayment (for medications including either Eliquis or Xarelto ) of 25% coinsurance Patient reports that she has picked up a 1 month supply of Eliquis as prescribed, but that this cost is unaffordable [place] term  Discuss criteria for manufacturer patient assistance programs for Eliquis and Xarelto . Note Eliquis patient assistance program requires 3% out of pocket spend on prescription medications for qualification annually. Xarelto  program previously had an out of pocket spend requirement, but has recently removed this.  Based on reported income, patient would meet criteria for Xarelto  patient assistance from [place] and [place]. Patient  is interested in switching from Eliquis back to Xarelto  (previously on this medication) in the future for affordability. - Will collaborate with PCP and Neurologist regarding this potential change - If prescriber agrees, will collaborate with CPhT and prescriber to help patient to apply for Xarelto  patient assistance program  Will plan to email application, as requested, to email address on record  Atrial Fibrillation: - Reviewed importance of adherence to anticoagulant for stroke prevention. - Reviewed appropriate blood pressure monitoring technique and reviewed goal blood pressure. - Recommend to monitor home blood pressure, keep log of results and have this record to review at upcoming medical appointments. Patient to contact provider office sooner if needed for readings outside of established parameters or symptoms  Follow Up Plan: Clinical Pharmacist will follow up with patient by telephone again within the next 7 days  Sharyle Sia, PharmD, JAQUELINE, CPP Clinical Pharmacist [facility] [phone]

## 2024-02-15 NOTE — Telephone Encounter (Signed)
 Pt called stating that [facility] is still telling her that they have not received the referral. Pt was informed that it was faxed twice to two different fax numbers and one was the one pt had provided. Pt stated she will call them back.

## 2024-02-15 NOTE — Patient Instructions (Signed)
 Please follow up with [facility] Cardiology today as recommended by your Neurologist  Check your blood pressure once daily, and any time you have concerning symptoms like headache, chest pain, dizziness, shortness of breath, or vision changes.    To appropriately check your blood pressure, make sure you do the following:  1) Avoid caffeine, exercise, or tobacco products for 30 minutes before checking. Empty your bladder. 2) Sit with your back supported in a flat-backed chair. Rest your arm on something flat (arm of the chair, table, etc). 3) Sit still with your feet flat on the floor, resting, for at least 5 minutes.  4) Check your blood pressure. Take 1-2 readings.  5) Write down these readings and bring with you to any provider appointments.  Bring your home blood pressure machine with you to a provider's office for accuracy comparison at least once a year.   Make sure you take your blood pressure medications before you come to any office visit, even if you were asked to fast for labs.  Sharyle Sia, PharmD, [facility] [phone]

## 2024-02-15 NOTE — Progress Notes (Addendum)
 Receive message back from PCP advising he is in agreement to switch patient to Xarelto  in 1 month when finishes current Eliquis.   Patient meets financial criteria for Xarelto  patient assistance program through [employer]. Will collaborate with provider, CPhT, and patient to pursue assistance.   Will ask CPhT to email application, as requested, to email address on record   Follow Up Plan: Clinical Pharmacist will follow up with patient by telephone in 4 weeks  Sharyle Sia, PharmD, JAQUELINE, CPP Clinical Pharmacist [facility] [phone]

## 2024-02-18 NOTE — Telephone Encounter (Signed)
 Pt called to follow up about referral for Echo TEE . Pt stated that [facility]  have not received referral , Informed  Pt looks like referral was sent to correct fax number . Pt is requesting   referral to be resent  and a call to follow up

## 2024-02-18 NOTE — Telephone Encounter (Signed)
 I faxed it again

## 2024-02-19 ENCOUNTER — Emergency Department

## 2024-02-19 ENCOUNTER — Observation Stay
Admission: EM | Admit: 2024-02-19 | Discharge: 2024-02-20 | Disposition: A | Discharge status: Home / Self Care | Attending: Emergency Medicine | Admitting: Emergency Medicine

## 2024-02-19 ENCOUNTER — Other Ambulatory Visit: Payer: Self-pay

## 2024-02-19 ENCOUNTER — Ambulatory Visit: Payer: Self-pay

## 2024-02-19 DIAGNOSIS — I10 Essential (primary) hypertension: Secondary | ICD-10-CM | POA: Insufficient documentation

## 2024-02-19 DIAGNOSIS — Z7901 Long term (current) use of anticoagulants: Secondary | ICD-10-CM | POA: Diagnosis not present

## 2024-02-19 DIAGNOSIS — R001 Bradycardia, unspecified: Principal | ICD-10-CM | POA: Insufficient documentation

## 2024-02-19 DIAGNOSIS — Z79899 Other long term (current) drug therapy: Secondary | ICD-10-CM | POA: Diagnosis not present

## 2024-02-19 DIAGNOSIS — R32 Unspecified urinary incontinence: Secondary | ICD-10-CM | POA: Insufficient documentation

## 2024-02-19 DIAGNOSIS — R42 Dizziness and giddiness: Principal | ICD-10-CM

## 2024-02-19 LAB — TROPONIN T, HIGH SENSITIVITY
Troponin T High Sensitivity: <15 ng/L (ref 0–19)
Troponin T High Sensitivity: <15 ng/L (ref 0–19)

## 2024-02-19 LAB — PROTIME-INR
INR: 1 (ref 0.8–1.2)
Prothrombin Time: 14.3 s (ref 11.4–15.2)

## 2024-02-19 LAB — URINALYSIS, ROUTINE W REFLEX MICROSCOPIC
Bilirubin Urine: NEGATIVE
Glucose, UA: NEGATIVE mg/dL
Hgb urine dipstick: NEGATIVE
Ketones, ur: NEGATIVE mg/dL
Leukocytes,Ua: NEGATIVE
Nitrite: NEGATIVE
Protein, ur: NEGATIVE mg/dL
Specific Gravity, Urine: 1.003 — ABNORMAL LOW (ref 1.005–1.030)
pH: 7 (ref 5.0–8.0)

## 2024-02-19 LAB — MAGNESIUM: Magnesium: 2.1 mg/dL (ref 1.7–2.4)

## 2024-02-19 LAB — CBC WITH DIFFERENTIAL/PLATELET
Abs Immature Granulocytes: 0.01 K/uL (ref 0.00–0.07)
Basophils Absolute: 0 K/uL (ref 0.0–0.1)
Basophils Relative: 1 %
Eosinophils Absolute: 0.2 K/uL (ref 0.0–0.5)
Eosinophils Relative: 4 %
HCT: 38 % (ref 36.0–46.0)
Hemoglobin: 12.5 g/dL (ref 12.0–15.0)
Immature Granulocytes: 0 %
Lymphocytes Relative: 28 %
Lymphs Abs: 1.4 K/uL (ref 0.7–4.0)
MCH: 30 pg (ref 26.0–34.0)
MCHC: 32.9 g/dL (ref 30.0–36.0)
MCV: 91.3 fL (ref 80.0–100.0)
Monocytes Absolute: 0.4 K/uL (ref 0.1–1.0)
Monocytes Relative: 7 %
Neutro Abs: 3 K/uL (ref 1.7–7.7)
Neutrophils Relative %: 60 %
Platelets: 219 K/uL (ref 150–400)
RBC: 4.16 MIL/uL (ref 3.87–5.11)
RDW: 13.6 % (ref 11.5–15.5)
WBC: 5.1 K/uL (ref 4.0–10.5)
nRBC: 0 % (ref 0.0–0.2)

## 2024-02-19 LAB — COMPREHENSIVE METABOLIC PANEL WITH GFR
ALT: 23 U/L (ref 0–44)
AST: 26 U/L (ref 15–41)
Albumin: 4.2 g/dL (ref 3.5–5.0)
Alkaline Phosphatase: 80 U/L (ref 38–126)
Anion gap: 9 (ref 5–15)
BUN: 11 mg/dL (ref 8–23)
CO2: 23 mmol/L (ref 22–32)
Calcium: 9.4 mg/dL (ref 8.9–10.3)
Chloride: 108 mmol/L (ref 98–111)
Creatinine, Ser: 0.74 mg/dL (ref 0.44–1.00)
GFR, Estimated: >60 mL/min (ref >60)
Glucose, Bld: 90 mg/dL (ref 70–99)
Potassium: 4.6 mmol/L (ref 3.5–5.1)
Sodium: 140 mmol/L (ref 135–145)
Total Bilirubin: 0.5 mg/dL (ref 0.0–1.2)
Total Protein: 6.7 g/dL (ref 6.5–8.1)

## 2024-02-19 LAB — PHOSPHORUS: Phosphorus: 2.8 mg/dL (ref 2.5–4.6)

## 2024-02-19 LAB — APTT: aPTT: 31 s (ref 24–36)

## 2024-02-19 LAB — TSH: TSH: 1.42 u[IU]/mL (ref 0.350–4.500)

## 2024-02-19 MED ORDER — APIXABAN 5 MG PO TABS
5.0000 mg | ORAL_TABLET | Freq: Two times a day (BID) | ORAL | Status: DC
  Administered 2024-02-19 – 2024-02-20 (×3): 5 mg via ORAL
  Filled 2024-02-19 (×3): qty 1

## 2024-02-19 MED ORDER — ONDANSETRON HCL 4 MG PO TABS: 4.0000 mg | ORAL_TABLET | Freq: Four times a day (QID) | ORAL | Status: DC | PRN

## 2024-02-19 MED ORDER — ACETAMINOPHEN 325 MG PO TABS: 650.0000 mg | ORAL_TABLET | Freq: Four times a day (QID) | ORAL | Status: DC | PRN

## 2024-02-19 MED ORDER — ACETAMINOPHEN 650 MG RE SUPP
650.0000 mg | Freq: Four times a day (QID) | RECTAL | Status: DC | PRN
Start: 2024-02-19 — End: 2024-02-20

## 2024-02-19 MED ORDER — FLECAINIDE ACETATE 100 MG PO TABS
100.0000 mg | ORAL_TABLET | Freq: Two times a day (BID) | ORAL | Status: DC
Start: 2024-02-19 — End: 2024-02-20
  Administered 2024-02-19: 100 mg via ORAL
  Filled 2024-02-19 (×3): qty 1

## 2024-02-19 MED ORDER — HYDRALAZINE HCL 20 MG/ML IJ SOLN: 5.0000 mg | Freq: Four times a day (QID) | INTRAMUSCULAR | Status: DC | PRN

## 2024-02-19 MED ORDER — MECLIZINE HCL 25 MG PO TABS
25.0000 mg | ORAL_TABLET | Freq: Once | ORAL | Status: AC
  Administered 2024-02-19: 25 mg via ORAL
  Filled 2024-02-19: qty 1

## 2024-02-19 MED ORDER — ONDANSETRON HCL 4 MG/2ML IJ SOLN
4.0000 mg | Freq: Four times a day (QID) | INTRAMUSCULAR | Status: DC | PRN
Start: 2024-02-19 — End: 2024-02-20

## 2024-02-19 MED ORDER — ATORVASTATIN CALCIUM 80 MG PO TABS
80.0000 mg | ORAL_TABLET | Freq: Every day | ORAL | Status: DC
  Administered 2024-02-19 – 2024-02-20 (×2): 80 mg via ORAL
  Filled 2024-02-19: qty 1
  Filled 2024-02-19: qty 4

## 2024-02-19 NOTE — Telephone Encounter (Signed)
 FYI Only or Action Required?: FYI only for provider: ED advised.  Patient was last seen in primary care on 02/06/2024 by Rachel Marsa PARAS, DO.  Called Nurse Triage reporting Dizziness.  Symptoms began a week ago.  Interventions attempted: Rest, hydration, or home remedies.  Symptoms are: gradually worsening.  Triage Disposition: Call EMS 911 Now  Patient/caregiver understands and will follow disposition?: Yes   Past Medical History:  Diagnosis Date   Hypertension    Urinary incontinence   -A fib -CVA approx 3 weeks ago Reason for Disposition  Sounds like a life-threatening emergency to the triager  Answer Assessment - Initial Assessment Questions Pts daughter reports HR of 46 this AM and BP of 115/69. Currently experiencing intermittent dizziness x 1 week. Also reports fluctuating BP. L cerebellar CVA approx 3 weeks ago (see ED note 01/24/24). Pt is on beta blocker metoprolol, is on eliquis. States baseline BP is 130/80 and HR 60.  Daughter reports R lower leg deficit improved after ED tx but does have some tingling.   Advised to go to ED via EMS. This RN contacted 911 and EMS was dispatched. Pts daughter advised to gather pts medications, unlock door, secure pets and keep pt NPO.   Reports taking: Flecanide Metoprolol (please add to med list and/or confirm if should be taking) Amlodipine  Eliquis Atorvastatin  1. DESCRIPTION: Describe your dizziness.     Dizzy, lightheadedness, possible vertigo  2. LIGHTHEADED: Do you feel lightheaded? (e.g., somewhat faint, woozy, weak upon standing)     Yes  3. VERTIGO: Do you feel like either you or the room is spinning or tilting? (i.e., vertigo)     Yes  5. ONSET:  When did the dizziness begin?     One week  6. AGGRAVATING FACTORS: Does anything make it worse? (e.g., standing, change in head position)     Worse with standing  7. HEART RATE: Can you tell me your heart rate? How many beats in 15 seconds?   (Note: Not all patients can do this.)       46  Protocols used: Dizziness - Lightheadedness-A-AH

## 2024-02-19 NOTE — ED Triage Notes (Signed)
 Here for dizziness x 1, Hx of stroke in October. Has been dizzy since October. Weakness to right leg but otherwise regular stroke screen. Hx of heart block. Hx of a-fib. On Eliquis. Felt normal last night this morning she felt dizzy.   CBG: 112.

## 2024-02-19 NOTE — H&P (Signed)
 History and Physical    Rachel Whitehead FMW:[id number] DOB: [birth year] DOA: 02/19/2024  PCP: Edman Marsa PARAS, DO (Confirm with patient/family/NH records and if not entered, this has to be entered at [facility] point of entry) Patient coming from: Home  I have personally briefly reviewed patient's old medical records in [facility]  Chief Complaint: Feeling light headed, BP low  HPI: Rachel Whitehead is a 68 y.o. female with medical history significant of PAF status post cardioversion and recent ablation, status post maze procedure, poor tolerance to anticoagulation due to recurrent severe hematuria, recent stroke, presented with lightheadedness and bradycardia.  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  Last week she was seen by her neurology, who changed her antiplatelet therapy to Eliquis, which she has been taking for the last 6 days.  She was fine last night, woke up this morning and started to feel lightheadedness, she wears a Apple Watch which showed heart rate in the 40s.  She called her primary's office who recommended she come to ED.  She denied any new weakness or numbness, no vision changes. ED Course: Afebrile, heart rate in the 40-mid 50s, blood pressure 160/70 O2 saturation 100% room air.  CT head negative for acute findings, blood work showed K4.6 hemoglobin 12.5 WBC 5.1.  Review of Systems: As per HPI otherwise 14 point review of systems negative.    Past Medical History:  Diagnosis Date   Hypertension    Urinary incontinence     Past Surgical History:  Procedure Laterality Date   CARDIOVERSION N/A 02/14/2022   Procedure: CARDIOVERSION;  Surgeon: Ammon Marsa, MD;  Location: [facility] ORS;  Service: Cardiovascular;  Laterality: N/A;   CARDIOVERSION N/A 02/28/2022   Procedure: CARDIOVERSION;  Surgeon: Ammon Marsa, MD;  Location: [facility] ORS;  Service: Cardiovascular;  Laterality: N/A;    CARDIOVERSION N/A 03/30/2022   Procedure: CARDIOVERSION;  Surgeon: Ammon Marsa, MD;  Location: [facility] ORS;  Service: Cardiovascular;  Laterality: N/A;   CARDIOVERSION N/A 10/19/2022   Procedure: CARDIOVERSION;  Surgeon: Dewane Shiner, DO;  Location: [facility] ORS;  Service: Cardiovascular;  Laterality: N/A;   STRABISMUS SURGERY Left    1961     reports that she has never smoked. She does not have any smokeless tobacco history on file. She reports that she does not drink alcohol and does not use drugs.  No Known Allergies  Family History  Problem Relation Age of Onset   Cancer Father    Breast cancer Sister 74     Prior to Admission medications   Medication Sig Start Date End Date Taking? Authorizing Provider  acetaminophen  (TYLENOL ) 325 MG tablet Take 975 mg by mouth every 6 (six) hours as needed for mild pain (pain score 1-3). Patient taking differently: Take 650 mg by mouth daily as needed for mild pain (pain score 1-3). 01/18/24   [provider]  amLODipine  (NORVASC ) 2.5 MG tablet Take 1 tablet (2.5 mg total) by mouth daily. 03/05/23   Karamalegos, Marsa PARAS, DO  apixaban (ELIQUIS) 5 MG TABS tablet Take 1 tablet (5 mg total) by mouth 2 (two) times daily. 02/13/24   Sethi, Pramod S, MD  atorvastatin (LIPITOR) 80 MG tablet Take 1 tablet (80 mg total) by mouth daily. 01/26/24   Awanda City, MD  flecainide (TAMBOCOR) 100 MG tablet Take 100 mg by mouth 2 (two) times daily. 06/28/23 06/27/24  [provider]  Multiple Vitamin (MULTIVITAMIN) tablet Take 1 tablet by mouth daily.  [provider]    Physical Exam: Vitals:   02/19/24 1012 02/19/24 1029  BP:  (!) 159/80  Pulse: (!) 49   Resp: 17   Temp: 97.9 F (36.6 C)   SpO2: 100%   Weight: 78 kg   Height: 5' 4 (1.626 m)     Constitutional: NAD, calm, comfortable Vitals:   02/19/24 1012 02/19/24 1029  BP:  (!) 159/80  Pulse: (!) 49   Resp: 17   Temp: 97.9 F (36.6 C)   SpO2: 100%   Weight:  78 kg   Height: 5' 4 (1.626 m)    Eyes: PERRL, lids and conjunctivae normal ENMT: Mucous membranes are moist. Posterior pharynx clear of any exudate or lesions.Normal dentition.  Neck: normal, supple, no masses, no thyromegaly Respiratory: clear to auscultation bilaterally, no wheezing, no crackles. Normal respiratory effort. No accessory muscle use.  Cardiovascular: Regular rate and rhythm, no murmurs / rubs / gallops. No extremity edema. 2+ pedal pulses. No carotid bruits.  Abdomen: no tenderness, no masses palpated. No hepatosplenomegaly. Bowel sounds positive.  Musculoskeletal: no clubbing / cyanosis. No joint deformity upper and lower extremities. Good ROM, no contractures. Normal muscle tone.  Skin: no rashes, lesions, ulcers. No induration Neurologic: CN 2-12 grossly intact. Sensation intact, DTR normal. Strength 5/5 in all 4.  Psychiatric: Normal judgment and insight. Alert and oriented x 3. Normal mood.    Labs on Admission: I have personally reviewed following labs and imaging studies  CBC: Recent Labs  Lab 02/19/24 1016  WBC 5.1  NEUTROABS 3.0  HGB 12.5  HCT 38.0  MCV 91.3  PLT 219   Basic Metabolic Panel: Recent Labs  Lab 02/19/24 1016  NA 140  K 4.6  CL 108  CO2 23  GLUCOSE 90  BUN 11  CREATININE 0.74  CALCIUM 9.4  MG 2.1   GFR: Estimated Creatinine Clearance: 66.1 mL/min (by C-G formula based on SCr of 0.74 mg/dL). Liver Function Tests: Recent Labs  Lab 02/19/24 1016  AST 26  ALT 23  ALKPHOS 80  BILITOT 0.5  PROT 6.7  ALBUMIN 4.2   No results for input(s): LIPASE, AMYLASE in the last 168 hours. No results for input(s): AMMONIA in the last 168 hours. Coagulation Profile: Recent Labs  Lab 02/19/24 1016  INR 1.0   Cardiac Enzymes: No results for input(s): CKTOTAL, CKMB, CKMBINDEX, TROPONINI in the last 168 hours. BNP (last 3 results) No results for input(s): PROBNP in the last 8760 hours. HbA1C: No results for input(s):  HGBA1C in the last 72 hours. CBG: No results for input(s): GLUCAP in the last 168 hours. Lipid Profile: No results for input(s): CHOL, HDL, LDLCALC, TRIG, CHOLHDL, LDLDIRECT in the last 72 hours. Thyroid Function Tests: Recent Labs    02/19/24 1016  TSH 1.420   Anemia Panel: No results for input(s): VITAMINB12, FOLATE, FERRITIN, TIBC, IRON, RETICCTPCT in the last 72 hours. Urine analysis:    Component Value Date/Time   COLORURINE COLORLESS (A) 02/19/2024 1100   APPEARANCEUR CLEAR (A) 02/19/2024 1100   LABSPEC 1.003 (L) 02/19/2024 1100   PHURINE 7.0 02/19/2024 1100   GLUCOSEU NEGATIVE 02/19/2024 1100   HGBUR NEGATIVE 02/19/2024 1100   BILIRUBINUR NEGATIVE 02/19/2024 1100   BILIRUBINUR negative 05/09/2023 1346   KETONESUR NEGATIVE 02/19/2024 1100   PROTEINUR NEGATIVE 02/19/2024 1100   UROBILINOGEN 0.2 05/09/2023 1346   NITRITE NEGATIVE 02/19/2024 1100   LEUKOCYTESUR NEGATIVE 02/19/2024 1100    Radiological Exams on Admission: CT HEAD WO CONTRAST ( ) Result  Date: 02/19/2024 CLINICAL DATA:  Dizziness. Patient on blood thinners. Right leg weakness. EXAM: CT HEAD WITHOUT CONTRAST TECHNIQUE: Contiguous axial images were obtained from the base of the skull through the vertex without intravenous contrast. RADIATION DOSE REDUCTION: This exam was performed according to the departmental dose-optimization program which includes automated exposure control, adjustment of the mA and/or kV according to patient size and/or use of iterative reconstruction technique. COMPARISON:  CT 01/24/2024, MRI 01/24/2024 FINDINGS: Brain: Ventricles, cisterns and other CSF spaces are within normal. Evidence of mild chronic ischemic microvascular disease. Previously seen left cerebellar small focal infarct significantly smaller on the current exam. Previously seen small infarct over the high left parietal region not seen by CT. No mass, mass effect, shift of midline structures or acute  hemorrhage. No evidence of acute infarction. Vascular: No hyperdense vessel or unexpected calcification. Skull: Normal. Negative for fracture or focal lesion. Sinuses/Orbits: No acute finding. Other: None. IMPRESSION: 1. No acute findings. 2. Mild chronic ischemic microvascular disease. Previously seen small left cerebellar infarct significantly smaller on the current exam. Electronically Signed   By: Toribio Agreste M.D.   On: 02/19/2024 11:32    EKG: Independently reviewed.  Sinus bradycardia, no acute ST changes.  Assessment/Plan Principal Problem:   Bradycardia Active Problems:   Sinus bradycardia  (please populate well all problems here in Problem List. (For example, if patient is on BP meds at home and you resume or decide to hold them, it is a problem that needs to be her. Same for CAD, COPD, HLD and so on)  Symptomatic bradycardia Near syncope - Will have to hold beta-blocker today - Continue flecainide - Check TSH. - Other DDx, history does not support vertigo.  Will not further pursue stroke workup as patient just had a stroke last month and was restarted on Eliquis last week.    PAF, status post Watchman procedure status post maze procedure -Heart rate in the sinus rhythm, her Apple Watch also confirmed no episode of A-fib.  No evidence of breakthrough A-fib on ED telemonitoring. - Continue paraglide, hold off metoprolol -Continue Eliquis -As per patient, there was a discussion between patient and her neurologist last week, who suspect  problem with Watchman and neurology scheduled an outpatient TEE to further evaluate Watchman device next week. - Patient reported that she used to have recurrent bleeding associated with the right sided kidney stone which was removed after urology intervention including lithotripsy.  At this point rebleeding risk is low and the recurrence of stroke appears to be high.  Patient agreed with stay on anticoagulation Xarelto  versus Eliquis.    DVT  prophylaxis: Eliquis Code Status: Full code Family Communication: None at bedside Disposition Plan: Expect less than 2 midnight hospital stay Consults called: None Admission status: Telemetry observation   Cort ONEIDA Mana MD [facility] Pager [phone]  02/19/2024, 1:46 PM

## 2024-02-19 NOTE — Telephone Encounter (Signed)
 Copied from CRM [id number]. Topic: Clinical - Red Word Triage >> Feb 19, 2024  8:47 AM Antony RAMAN wrote: Red Word that prompted transfer to Nurse Triage: 46 BPM heart rate and low bp 115/69 and dizzy

## 2024-02-19 NOTE — ED Provider Notes (Signed)
 [facility] Provider Note    Event Date/Time   First MD Initiated Contact with Patient 02/19/24 1011     (approximate)   History   No chief complaint on file.   HPI  Rachel Whitehead is a 68 y.o. 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  I reviewed a note where patient was seen on 10/23 until  10/25.  Patient had right sided lower leg weakness and was found to have acute to subacute left cerebral infarct.  CTA was negative.  Plan was to be on aspirin and Plavix for 21 days and then Plavix monotherapy.  Patient is currently on flecainide and Toprol for her history of A-fib and she is undergone ablation.  Patient neurology follow-up from 11/12 was reviewed.  The plan is to undergo TEE for evaluation of the watchman's device to try to figure out why she still had a stroke with it in place.  They had discussed putting her on Eliquis and discontinuing aspirin and Plavix and so this was just started about 1 week ago.  I reviewed the notes and this was 6 days ago.   She reports that since starting the Eliquis she has noticed that her heart rates are lower into the 40s and she has been having more dizziness.  She reported having dizziness with the initial stroke but it was more apparent these past couple of days.  She reports feeling dizzy all day yesterday and continuing dizziness this morning.  She denies any relief of dizziness over the past 24 hours.  She reported denies take anything to try to help with the dizziness.  Denies any chest pain, shortness of breath.  Pt denies taking too much of her medications. It appears pt may be on only 12.5 mg metoprolol daily. With felcanide 100 BID based on 11/10 pulm visit. Pt herself is not sure on metoprolol dosage.   Physical Exam   Triage Vital Signs: ED Triage Vitals  Encounter Vitals Group     BP      Girls  Systolic BP Percentile      Girls Diastolic BP Percentile      Boys Systolic BP Percentile      Boys Diastolic BP Percentile      Pulse      Resp      Temp      Temp src      SpO2      Weight      Height      Head Circumference      Peak Flow      Pain Score      Pain Loc      Pain Education      Exclude from Growth Chart     Most recent vital signs: Vitals:   02/19/24 1012 02/19/24 1029  BP:  (!) 159/80  Pulse: (!) 49   Resp: 17   Temp: 97.9 F (36.6 C)   SpO2: 100%      General: Awake, no distress.  CV:  Good peripheral perfusion.  Bradycardia Resp:  Normal effort.  Abd:  No distention.  Soft and nontender Other:  Cranial nerves appear intact equal strength in arms and legs   ED Results / Procedures / Treatments   Labs (all labs ordered are listed, but only abnormal results are displayed) Labs Reviewed  CBC WITH DIFFERENTIAL/PLATELET  COMPREHENSIVE METABOLIC PANEL  WITH GFR  PROTIME-INR  APTT  URINALYSIS, ROUTINE W REFLEX MICROSCOPIC  TSH  MAGNESIUM  TROPONIN T, HIGH SENSITIVITY     EKG  My interpretation of EKG:  Sinus bradycardia with a rate of 51 without any ST elevation, T wave inversion in lead III, V3 and V6, normal intervals  RADIOLOGY I have reviewed the ct personally and interpreted    PROCEDURES:  Critical Care performed: No  .1-3 Lead EKG Interpretation  Performed by: Ernest Ronal BRAVO, MD Authorized by: Ernest Ronal BRAVO, MD     Interpretation: abnormal     ECG rate:  40   ECG rate assessment: bradycardic     Rhythm: sinus bradycardia     Ectopy: none     Conduction: normal      MEDICATIONS ORDERED IN ED: Medications  meclizine (ANTIVERT) tablet 25 mg (has no administration in time range)     IMPRESSION / MDM / ASSESSMENT AND PLAN / ED COURSE  I reviewed the triage vital signs and the nursing notes.   Patient's presentation is most consistent with acute presentation with potential threat to life or bodily function.    Patient comes in with dizziness.  Patient with heart rates in the 40s on telemetry it is sinus in nature and blood pressures are stable.  Suspect this is related to medications including metoprolol, flecainide.  Given she does report worsening after being started on Eliquis will get CT head to make sure no evidence of intercranial hemorrhage.  Her neuroexam is otherwise reassuring.  Stroke code not called given symptoms onset over 24 hours and contraindication for TNK as being on Eliquis.  She already had a CT angio done recently without evidence of significant stenosis.  UA without evidence of UTI CBC reassuring CMP reassuring troponin was negative.  CT imaging of the head was negative  Reevaluated patient she reports feeling worse after meclizine.  Given continued dizziness in the setting of bradycardia I will discuss with the hospitalist for admission.  Patient will need time to washout her metoprolol, consideration of repeat MRI to ensure no additional strokes and further workup and monitoring.    The patient is on the cardiac monitor to evaluate for evidence of arrhythmia and/or significant heart rate changes.      FINAL CLINICAL IMPRESSION(S) / ED DIAGNOSES   Final diagnoses:  Dizziness  Sinus bradycardia     Rx / DC Orders   ED Discharge Orders     None        Note:  This document was prepared using Dragon voice recognition software and may include unintentional dictation errors.   Ernest Ronal BRAVO, MD 02/19/24 [phone]

## 2024-02-20 ENCOUNTER — Other Ambulatory Visit (HOSPITAL_COMMUNITY): Payer: Self-pay

## 2024-02-20 DIAGNOSIS — R001 Bradycardia, unspecified: Secondary | ICD-10-CM | POA: Diagnosis not present

## 2024-02-20 MED ORDER — FLECAINIDE ACETATE 100 MG PO TABS: 50.0000 mg | ORAL_TABLET | Freq: Two times a day (BID) | ORAL | Status: AC

## 2024-02-20 MED ORDER — AMLODIPINE BESYLATE 5 MG PO TABS
2.5000 mg | ORAL_TABLET | Freq: Every day | ORAL | Status: DC
  Administered 2024-02-20: 2.5 mg via ORAL
  Filled 2024-02-20: qty 1

## 2024-02-20 MED ORDER — FLECAINIDE ACETATE 50 MG PO TABS
50.0000 mg | ORAL_TABLET | Freq: Two times a day (BID) | ORAL | Status: DC
  Administered 2024-02-20: 50 mg via ORAL
  Filled 2024-02-20: qty 1

## 2024-02-20 NOTE — Care Management Obs Status (Signed)
 MEDICARE OBSERVATION STATUS NOTIFICATION   Patient Details  Name: Rachel Whitehead MRN: [id number] Date of Birth: [birth year]   Medicare Observation Status Notification Given:  No Patient discharged   Rojelio SHAUNNA Rattler 02/20/2024, 12:38 PM

## 2024-02-20 NOTE — Plan of Care (Signed)

## 2024-02-20 NOTE — Plan of Care (Signed)
   Problem: Education: Goal: Knowledge of General Education information will improve Description Including pain rating scale, medication(s)/side effects and non-pharmacologic comfort measures Outcome: Progressing

## 2024-02-20 NOTE — Consult Note (Signed)
 [facility] CLINIC CARDIOLOGY CONSULT NOTE       Patient ID: Rachel Whitehead MRN: [id number] DOB/AGE: 1955/12/03 68 y.o.  Admit date: 02/19/2024 Referring Physician Dr. Ellouise Haber Primary Physician Edman, Marsa PARAS, DO  Primary Cardiologist Dr. Annalee Casa  Reason for Consultation bradycardia  HPI: Rachel Whitehead is a 68 y.o. female  with a past medical history of persistent atrial fibrillation with multiple prior ablations and prior watchman implantation 08/2023, s/p MAZE 01/2024, recent CVA 01/2024, hypertension who presented to the ED on 02/19/2024 for lightheadedness, bradycardia. Cardiology was consulted for further evaluation.   Patient with acute onset lightheadedness/dizziness, mild confusion.  Brought to the ED for further evaluation.  Workup in the ED notable for creatinine 0.74, potassium 4.6, hemoglobin 12.5, WBC 5.1. Troponins <15 x2. EKG in the ED sinus bradycardia rate 51 bpm.  CT head without acute abnormality. BB held on admission.   At the time my evaluation this morning, patient is resting in hospital bed with family at bedside.  We discussed her symptoms in further detail.  She endorses lightheadedness and dizziness and noticing that her heart rate was running on the low side.  She denies any chest pain, shortness of breath, palpitations.  Has been tolerating her medications well with no issues.  States that she recently saw neurology and was started on Eliquis  last week.  Review of systems complete and found to be negative unless listed above    Past Medical History:  Diagnosis Date   Hypertension    Urinary incontinence     Past Surgical History:  Procedure Laterality Date   CARDIOVERSION N/A 02/14/2022   Procedure: CARDIOVERSION;  Surgeon: Ammon Marsa, MD;  Location: [facility] ORS;  Service: Cardiovascular;  Laterality: N/A;   CARDIOVERSION N/A 02/28/2022   Procedure: CARDIOVERSION;  Surgeon: Ammon Marsa, MD;  Location: [facility] ORS;  Service:  Cardiovascular;  Laterality: N/A;   CARDIOVERSION N/A 03/30/2022   Procedure: CARDIOVERSION;  Surgeon: Ammon Marsa, MD;  Location: [facility] ORS;  Service: Cardiovascular;  Laterality: N/A;   CARDIOVERSION N/A 10/19/2022   Procedure: CARDIOVERSION;  Surgeon: Casa Annalee, DO;  Location: [facility] ORS;  Service: Cardiovascular;  Laterality: N/A;   STRABISMUS SURGERY Left    1961    Medications Prior to Admission  Medication Sig Dispense Refill Last Dose/Taking   amLODipine  (NORVASC ) 2.5 MG tablet Take 1 tablet (2.5 mg total) by mouth daily. 90 tablet 3 02/18/2024   apixaban  (ELIQUIS ) 5 MG TABS tablet Take 1 tablet (5 mg total) by mouth 2 (two) times daily. 60 tablet 3 02/18/2024   atorvastatin  (LIPITOR ) 80 MG tablet Take 1 tablet (80 mg total) by mouth daily. 30 tablet 2 02/18/2024   flecainide  (TAMBOCOR ) 100 MG tablet Take 100 mg by mouth 2 (two) times daily.   02/18/2024   metoprolol  succinate (TOPROL -XL) 25 MG 24 hr tablet Take 12.5 mg by mouth daily.   02/18/2024   Multiple Vitamin (MULTIVITAMIN) tablet Take 1 tablet by mouth daily.   02/18/2024   acetaminophen  (TYLENOL ) 325 MG tablet Take 975 mg by mouth every 6 (six) hours as needed for mild pain (pain score 1-3). (Patient not taking: Reported on 02/19/2024)   Not Taking   Social History   Socioeconomic History   Marital status: Single    Spouse name: Not on file   Number of children: Not on file   Years of education: Not on file   Highest education level: Not on file  Occupational History   Not on file  Tobacco Use  Smoking status: Never   Smokeless tobacco: Not on file  Vaping Use   Vaping status: Never Used  Substance and Sexual Activity   Alcohol use: Never    Alcohol/week: 4.0 standard drinks of alcohol    Types: 4 Standard drinks or equivalent per week   Drug use: Never   Sexual activity: Not on file  Other Topics Concern   Not on file  Social History Narrative   Not on file   Social Drivers of Health    Financial Resource Strain: Low Risk  (06/19/2023)   Received from [facility]   Overall Financial Resource Strain (CARDIA)    Difficulty of Paying Living Expenses: Not hard at all  Food Insecurity: No Food Insecurity (02/19/2024)   Hunger Vital Sign    Worried About Running Out of Food in the Last Year: Never true    Ran Out of Food in the Last Year: Never true  Transportation Needs: No Transportation Needs (02/19/2024)   PRAPARE - Administrator, Civil Service (Medical): No    Lack of Transportation (Non-Medical): No  Physical Activity: Sufficiently Active (03/05/2023)   Exercise Vital Sign    Days of Exercise per Week: 4 days    Minutes of Exercise per Session: 60 min  Stress: No Stress Concern Present (03/05/2023)   [employer] of Occupational Health - Occupational Stress Questionnaire    Feeling of Stress : Only a little  Social Connections: Moderately Isolated (02/19/2024)   Social Connection and Isolation Panel    Frequency of Communication with Friends and Family: More than three times a week    Frequency of Social Gatherings with Friends and Family: More than three times a week    Attends Religious Services: 1 to 4 times per year    Active Member of [employer] or Organizations: No    Attends Banker Meetings: Never    Marital Status: Divorced  Catering Manager Violence: Not At Risk (02/19/2024)   Humiliation, Afraid, Rape, and Kick questionnaire    Fear of Current or Ex-Partner: No    Emotionally Abused: No    Physically Abused: No    Sexually Abused: No    Family History  Problem Relation Age of Onset   Cancer Father    Breast cancer Sister 29     Vitals:   02/19/24 2027 02/20/24 0009 02/20/24 0359 02/20/24 0902  BP: (!) 143/78 126/67 127/70 (!) 149/80  Pulse: (!) 47 (!) 50 (!) 49   Resp: 18 16 16 16   Temp: 98 F (36.7 C) 97.8 F (36.6 C) 98.3 F (36.8 C) (!) 97.4 F (36.3 C)  SpO2: 96% 96% 94% 98%  Weight:       Height:        PHYSICAL EXAM General: Well appearing female, well nourished, in no acute distress. HEENT: Normocephalic and atraumatic. Neck: No JVD.  Lungs: Normal respiratory effort on room air. Clear bilaterally to auscultation. No wheezes, crackles, rhonchi.  Heart: HRRR. Normal S1 and S2 without gallops or murmurs.  Abdomen: Non-distended appearing.  Msk: Normal strength and tone for age. Extremities: Warm and well perfused. No clubbing, cyanosis. No edema.  Neuro: Alert and oriented X 3. Psych: Answers questions appropriately.   Labs: Basic Metabolic Panel: Recent Labs    02/19/24 1016  NA 140  K 4.6  CL 108  CO2 23  GLUCOSE 90  BUN 11  CREATININE 0.74  CALCIUM 9.4  MG 2.1  PHOS 2.8  Liver Function Tests: Recent Labs    02/19/24 1016  AST 26  ALT 23  ALKPHOS 80  BILITOT 0.5  PROT 6.7  ALBUMIN 4.2   No results for input(s): LIPASE, AMYLASE in the last 72 hours. CBC: Recent Labs    02/19/24 1016  WBC 5.1  NEUTROABS 3.0  HGB 12.5  HCT 38.0  MCV 91.3  PLT 219   Cardiac Enzymes: No results for input(s): CKTOTAL, CKMB, CKMBINDEX, TROPONINIHS in the last 72 hours. BNP: No results for input(s): BNP in the last 72 hours. D-Dimer: No results for input(s): DDIMER in the last 72 hours. Hemoglobin A1C: No results for input(s): HGBA1C in the last 72 hours. Fasting Lipid Panel: No results for input(s): CHOL, HDL, LDLCALC, TRIG, CHOLHDL, LDLDIRECT in the last 72 hours. Thyroid  Function Tests: Recent Labs    02/19/24 1016  TSH 1.420   Anemia Panel: No results for input(s): VITAMINB12, FOLATE, FERRITIN, TIBC, IRON, RETICCTPCT in the last 72 hours.   Radiology: CT HEAD WO CONTRAST ( ) Result Date: 02/19/2024 CLINICAL DATA:  Dizziness. Patient on blood thinners. Right leg weakness. EXAM: CT HEAD WITHOUT CONTRAST TECHNIQUE: Contiguous axial images were obtained from the base of the skull through the vertex  without intravenous contrast. RADIATION DOSE REDUCTION: This exam was performed according to the departmental dose-optimization program which includes automated exposure control, adjustment of the mA and/or kV according to patient size and/or use of iterative reconstruction technique. COMPARISON:  CT 01/24/2024, MRI 01/24/2024 FINDINGS: Brain: Ventricles, cisterns and other CSF spaces are within normal. Evidence of mild chronic ischemic microvascular disease. Previously seen left cerebellar small focal infarct significantly smaller on the current exam. Previously seen small infarct over the high left parietal region not seen by CT. No mass, mass effect, shift of midline structures or acute hemorrhage. No evidence of acute infarction. Vascular: No hyperdense vessel or unexpected calcification. Skull: Normal. Negative for fracture or focal lesion. Sinuses/Orbits: No acute finding. Other: None. IMPRESSION: 1. No acute findings. 2. Mild chronic ischemic microvascular disease. Previously seen small left cerebellar infarct significantly smaller on the current exam. Electronically Signed   By: Toribio Agreste M.D.   On: 02/19/2024 11:32   US  Venous Img Lower Bilateral (DVT) Result Date: 01/25/2024 CLINICAL DATA:  Embolic stroke, concern for DVT EXAM: BILATERAL LOWER EXTREMITY VENOUS DOPPLER ULTRASOUND TECHNIQUE: Gray-scale sonography with graded compression, as well as color Doppler and duplex ultrasound were performed to evaluate the lower extremity deep venous systems from the level of the common femoral vein and including the common femoral, femoral, profunda femoral, popliteal and calf veins including the posterior tibial, peroneal and gastrocnemius veins when visible. The superficial great saphenous vein was also interrogated. Spectral Doppler was utilized to evaluate flow at rest and with distal augmentation maneuvers in the common femoral, femoral and popliteal veins. COMPARISON:  None Available. FINDINGS: RIGHT  LOWER EXTREMITY Common Femoral Vein: No evidence of thrombus. Normal compressibility, respiratory phasicity and response to augmentation. Saphenofemoral Junction: No evidence of thrombus. Normal compressibility and flow on color Doppler imaging. Profunda Femoral Vein: No evidence of thrombus. Normal compressibility and flow on color Doppler imaging. Femoral Vein: No evidence of thrombus. Normal compressibility, respiratory phasicity and response to augmentation. Popliteal Vein: No evidence of thrombus. Normal compressibility, respiratory phasicity and response to augmentation. Calf Veins: No evidence of thrombus. Normal compressibility and flow on color Doppler imaging. LEFT LOWER EXTREMITY Common Femoral Vein: No evidence of thrombus. Normal compressibility, respiratory phasicity and response to augmentation. Saphenofemoral Junction: No evidence  of thrombus. Normal compressibility and flow on color Doppler imaging. Profunda Femoral Vein: No evidence of thrombus. Normal compressibility and flow on color Doppler imaging. Femoral Vein: No evidence of thrombus. Normal compressibility, respiratory phasicity and response to augmentation. Popliteal Vein: No evidence of thrombus. Normal compressibility, respiratory phasicity and response to augmentation. Calf Veins: No evidence of thrombus. Normal compressibility and flow on color Doppler imaging. IMPRESSION: No evidence of deep venous thrombosis in either lower extremity. Electronically Signed   By: CHRISTELLA.  Shick M.D.   On: 01/25/2024 16:13   ECHOCARDIOGRAM COMPLETE Result Date: 01/25/2024    ECHOCARDIOGRAM REPORT   Patient Name:   LACYE Craigie Date of Exam: 01/25/2024 Medical Rec #:  [id number]    Height:       64.0 in Accession #:    [id number]   Weight:       176.6 lb Date of Birth:  Nov 29, 1955     BSA:          1.856 m Patient Age:    68 years     BP:           153/89 mmHg Patient Gender: F            HR:           59 bpm. Exam Location:  [facility] Procedure: 2D Echo,  Cardiac Doppler and Color Doppler (Both Spectral and Color            Flow Doppler were utilized during procedure). Indications:     Stroke I63.9  History:         Patient has no prior history of Echocardiogram examinations.                  Stroke.  Sonographer:     Rosina Dunk Referring Phys:  [id number] CORT ONEIDA MANA Diagnosing Phys: Keller Paterson IMPRESSIONS  1. Left ventricular ejection fraction, by estimation, is 60 to 65%. The left ventricle has normal function. The left ventricle has no regional wall motion abnormalities. Left ventricular diastolic parameters are consistent with Grade II diastolic dysfunction (pseudonormalization).  2. Right ventricular systolic function is normal. The right ventricular size is normal.  3. The mitral valve is normal in structure. Trivial mitral valve regurgitation.  4. The aortic valve is tricuspid. Aortic valve regurgitation is mild.  5. Aortic dilatation noted. There is mild dilatation of the aortic root, measuring 39 mm.  6. The inferior vena cava is normal in size with greater than 50% respiratory variability, suggesting right atrial pressure of 3 mmHg. FINDINGS  Left Ventricle: Left ventricular ejection fraction, by estimation, is 60 to 65%. The left ventricle has normal function. The left ventricle has no regional wall motion abnormalities. The left ventricular internal cavity size was normal in size. There is  no left ventricular hypertrophy. Left ventricular diastolic parameters are consistent with Grade II diastolic dysfunction (pseudonormalization). The ratio of pulmonic flow to systemic flow (Qp/Qs ratio) is 1.70. Right Ventricle: The right ventricular size is normal. No increase in right ventricular wall thickness. Right ventricular systolic function is normal. Left Atrium: Left atrial size was normal in size. Right Atrium: Right atrial size was normal in size. Pericardium: There is no evidence of pericardial effusion. Mitral Valve: The mitral valve is  normal in structure. Trivial mitral valve regurgitation. MV peak gradient, 5.5 mmHg. The mean mitral valve gradient is 2.0 mmHg. Tricuspid Valve: The tricuspid valve is normal in structure. Tricuspid valve regurgitation is mild. Aortic Valve: The aortic valve is  tricuspid. Aortic valve regurgitation is mild. Aortic valve mean gradient measures 5.0 mmHg. Aortic valve peak gradient measures 9.9 mmHg. Aortic valve area, by VTI measures 2.76 cm. Pulmonic Valve: The pulmonic valve was not well visualized. Pulmonic valve regurgitation is trivial. Aorta: Aortic dilatation noted. There is mild dilatation of the aortic root, measuring 39 mm. Venous: The inferior vena cava is normal in size with greater than 50% respiratory variability, suggesting right atrial pressure of 3 mmHg. IAS/Shunts: The atrial septum is grossly normal. The ratio of pulmonic flow to systemic flow (Qp/Qs ratio) is 1.70.  LEFT VENTRICLE PLAX 2D LVIDd:         4.30 cm     Diastology LVIDs:         1.80 cm     LV e' medial:    4.35 cm/s LV PW:         1.10 cm     LV E/e' medial:  21.9 LV IVS:        0.90 cm     LV e' lateral:   8.93 cm/s LVOT diam:     2.10 cm     LV E/e' lateral: 10.7 LV SV:         89 LV SV Index:   48 LVOT Area:     3.46 cm  LV Volumes (MOD) LV vol d, MOD A2C: 51.1 ml LV vol d, MOD A4C: 61.2 ml LV vol s, MOD A2C: 19.8 ml LV vol s, MOD A4C: 20.8 ml LV SV MOD A2C:     31.3 ml LV SV MOD A4C:     61.2 ml LV SV MOD BP:      34.7 ml RIGHT VENTRICLE             IVC RV Basal diam:  4.10 cm     IVC diam: 1.80 cm RV Mid diam:    3.10 cm RV S prime:     11.70 cm/s RVOT diam:      3.00 cm TAPSE (M-mode): 1.4 cm LEFT ATRIUM             Index        RIGHT ATRIUM           Index LA Vol (A2C):   45.6 ml 24.57 ml/m  RA Area:     20.00 cm LA Vol (A4C):   54.3 ml 29.26 ml/m  RA Volume:   54.80 ml  29.53 ml/m LA Biplane Vol: 52.7 ml 28.40 ml/m  AORTIC VALVE                     PULMONIC VALVE AV Area (Vmax):    2.58 cm      PV Area (Vmax):   6.14  cm AV Area (Vmean):   2.63 cm      PV Area (Vmean):  6.68 cm AV Area (VTI):     2.76 cm      PV Area (VTI):    6.73 cm AV Vmax:           157.00 cm/s   PV Vmax:          0.92 m/s AV Vmean:          103.000 cm/s  PV Vmean:         65.900 cm/s AV VTI:            0.323 m       PV VTI:  0.227 m AV Peak Grad:      9.9 mmHg      PV Peak grad:     3.4 mmHg AV Mean Grad:      5.0 mmHg      PV Mean grad:     2.0 mmHg LVOT Vmax:         117.00 cm/s   PR End Diast Vel: 2.85 msec LVOT Vmean:        78.200 cm/s   RVOT Peak grad:   3 mmHg LVOT VTI:          0.257 m LVOT/AV VTI ratio: 0.80  AORTA Ao Root diam: 3.90 cm Ao Asc diam:  3.50 cm MITRAL VALVE               TRICUSPID VALVE MV Area (PHT): 3.27 cm    TR Peak grad:   22.5 mmHg MV Area VTI:   2.62 cm    TR Mean grad:   16.0 mmHg MV Peak grad:  5.5 mmHg    TR Vmax:        237.00 cm/s MV Mean grad:  2.0 mmHg    TR Vmean:       197.0 cm/s MV Vmax:       1.17 m/s MV Vmean:      57.2 cm/s   SHUNTS MV Decel Time: 232 msec    Systemic VTI:  0.26 m MV E velocity: 95.20 cm/s  Systemic Diam: 2.10 cm MV A velocity: 37.70 cm/s  Pulmonic VTI:  0.216 m MV E/A ratio:  2.53        Pulmonic Diam: 3.00 cm                            Qp/Qs:         1.72 Keller Paterson Electronically signed by Keller Paterson Signature Date/Time: 01/25/2024/1:59:28 PM    Final    MR BRAIN WO CONTRAST Result Date: 01/24/2024 EXAM: MRI BRAIN WITHOUT CONTRAST 01/24/2024 03:27:04 PM TECHNIQUE: Multiplanar multisequence MRI of the head/brain was performed without the administration of intravenous contrast. COMPARISON: Head CT and CTA 01/24/2024 and MRI 10/29/2017. CLINICAL HISTORY: Neuro deficit, acute, stroke suspected. FINDINGS: BRAIN AND VENTRICLES: There is a 1 cm focus of mildly restricted diffusion involving cortex and subcortical white matter in the medial aspect of the left precentral gyrus consistent with an acute infarct. A 1 cm focus of T2 and trace diffusion weighted signal  hyperintensity in the left cerebellar hemisphere with normal ADC is most compatible with an early subacute infarct. Patchy T2 hyperintensities elsewhere in the cerebral white matter bilaterally have progressed from the prior MRI and are nonspecific but compatible with moderate chronic small vessel ischemic disease. There is a tiny chronic right cerebellar infarct. Encephalomalacia is again noted in the posteroinferior right temporal lobe. No age advanced cerebral atrophy, mass, midline shift, hydrocephalus, or extra axial fluid collection is evident. Numerous chronic cerebral microhemorrhages predominantly affect the deep gray nuclei suggestive of chronic hypertension. Major intracranial vascular flow voids are preserved. ORBITS: No acute abnormality. SINUSES AND MASTOIDS: No acute abnormality. BONES AND SOFT TISSUES: Normal marrow signal. No acute soft tissue abnormality. IMPRESSION: 1. Small acute infarct in the left precentral gyrus. 2. Small early subacute left cerebellar infarct. 3. Moderate chronic small vessel ischemic disease. 4. Numerous chronic cerebral microhemorrhages suggestive of chronic hypertension. Electronically signed by: Dasie Hamburg MD 01/24/2024 03:47 PM EDT RP Workstation: [id number]   CT ANGIO  HEAD NECK W WO CM (CODE STROKE) Result Date: 01/24/2024 EXAM: CTA HEAD AND NECK WITHOUT AND WITH 01/24/2024 11:31:08 AM TECHNIQUE: CTA of the head and neck was performed without and with the administration of 75 mL of iohexol  (OMNIPAQUE ) 350 MG/ML injection. Multiplanar 2D and/or 3D reformatted images are provided for review. Automated exposure control, iterative reconstruction, and/or weight based adjustment of the mA/kV was utilized to reduce the radiation dose to as low as reasonably achievable. Stenosis of the internal carotid arteries measured using NASCET criteria. COMPARISON: Head CT reported separately today. Brain MRI 10/30/2007. CLINICAL HISTORY: 68 year old female. Neuro deficit, acute,  stroke suspected; RLE acute paralysis c/f L ACA acute infarct. FINDINGS: CTA NECK: AORTIC ARCH AND ARCH VESSELS: Normal 3 vessel arch. Mild aortic arch tortuosity. No dissection or arterial injury. No significant stenosis of the brachiocephalic or subclavian arteries. Mildly tortuous proximal subclavian arteries with no plaque or stenosis. CERVICAL CAROTID ARTERIES: Bilateral cervical carotid arteries are mildly to moderately tortuous. No atherosclerosis or stenosis associated. Ectatic appearance of the distal ICAs without beading or irregularity of the vessel walls (series 7, image 136). No dissection or arterial injury. CERVICAL VERTEBRAL ARTERIES: Bilateral vertebral artery origins are mildly tortuous with no plaque or stenosis. Dominant right vertebral artery is widely patent and mildly ectatic to the skull base. Nondominant left vertebral artery is normal to the skull base. Normal right PICA origin. No dissection or arterial injury. LUNGS AND MEDIASTINUM: Unremarkable. SOFT TISSUES: No acute abnormality. BONES: Mild for age spine degeneration. No acute abnormality. CTA HEAD: ANTERIOR CIRCULATION: Both ICA siphons are ectatic, more so the right, with no plaque or stenosis. Normal right posterior communicating artery origin. Normal MCA and ACA origin. Diminutive or absent anterior communicating artery. Bilateral ACA branches are within normal limits. Tortuous MCA M1 segments. Patent MCA bifurcations without stenosis. Right MCA branches are within normal limits. Left MCA bifurcation or trifurcation is positive for a tiny saccular aneurysm directed laterally, 1 to 2 mm in size, best seen on series 8, image 140. Left MCA branches otherwise within normal limits. POSTERIOR CIRCULATION: The distal left vertebral artery terminates in PICA. The right vertebral artery supplies the basilar artery. Tortuous distal right vertebral artery without stenosis. Tortuous basilar artery without stenosis. Normal SCA and left PCA  origins. Fetal right PCA origin. Diminutive or absent left posterior communicating artery. No significant stenosis of the posterior cerebral arteries. No significant stenosis of the vertebral arteries. No aneurysm. OTHER: Early intracranial venous enhancement. Superior sagittal sinus is patent. No dural venous sinus thrombosis on this non-dedicated study. . These results were communicated to Dr. Matthews at 1139 hours on 01/24/2024 by text page via the [facility] messaging system. IMPRESSION: 1. No large vessel occlusion. Generalized arterial ectasia in the head and neck. Minimal atherosclerosis. No stenosis 2. 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 3. These results were communicated to Dr. Matthews at 11:39 hours on 01/24/2024 by text page via the [facility] messaging system. Electronically signed by: Helayne Hurst MD 01/24/2024 11:41 AM EDT RP Workstation: [id number]   CT HEAD CODE STROKE WO CONTRAST Result Date: 01/24/2024 EXAM: CT HEAD WITHOUT CONTRAST 01/24/2024 11:09:32 AM TECHNIQUE: CT of the head was performed without the administration of intravenous contrast. Automated exposure control, iterative reconstruction, and/or weight based adjustment of the mA/kV was utilized to reduce the radiation dose to as low as reasonably achievable. COMPARISON: Head CT and MRI 10/29/2017. CLINICAL HISTORY: Neuro deficit, acute, stroke suspected. Right leg numbness and weakness. Code  stroke. FINDINGS: BRAIN AND VENTRICLES: A 1 cm hypodensity in the left cerebellar hemisphere is new and suspicious for an acute or subacute infarct. No acute supratentorial infarct, intracranial hemorrhage, mass, midline shift, hydrocephalus, or extra-axial fluid collection is identified. No age-advanced cerebral atrophy is evident. Encephalomalacia in the posteroinferior right temporal lobe is unchanged. Patchy hypodensity elsewhere in the cerebral white matter bilaterally have progressed and are nonspecific but compatible with  moderate chronic small vessel ischemic disease. No hyperdense vessel. ORBITS: No acute abnormality. SINUSES: No acute abnormality. SOFT TISSUES AND SKULL: No acute soft tissue abnormality. No skull fracture. [place] Stroke Program Early CT Score (ASPECTS) ----- Ganglionic (caudate, IC, lentiform nucleus, insula, M1-M3): 7 Supraganglionic (M4-M6): 3 Total: 10 IMPRESSION: 1. Small acute/subacute left cerebellar infarct. 2. ASPECTS of 10. No intracranial hemorrhage. 3. Moderate chronic small vessel ischemic disease. 4. These results were communicated to Dr. Matthews at 11:18 AM on 01/24/2024 by secure text page via the [facility] messaging system. Electronically signed by: Dasie Hamburg MD 01/24/2024 11:36 AM EDT RP Workstation: [id number]    ECHO 01/2024: 1. Left ventricular ejection fraction, by estimation, is 60 to 65%. The left ventricle has normal function. The left ventricle has no regional wall motion abnormalities. Left ventricular diastolic parameters are consistent with Grade II diastolic dysfunction (pseudonormalization).   2. Right ventricular systolic function is normal. The right ventricular  size is normal.   3. The mitral valve is normal in structure. Trivial mitral valve regurgitation.   4. The aortic valve is tricuspid. Aortic valve regurgitation is mild.   5. Aortic dilatation noted. There is mild dilatation of the aortic root,  measuring 39 mm.   6. The inferior vena cava is normal in size with greater than 50%  respiratory variability, suggesting right atrial pressure of 3 mmHg.   TELEMETRY (personally reviewed): sinus brady rate 50s  EKG (personally reviewed): sinus bradycardia rate 51 bpm  Data reviewed by me 02/20/2024: last 24h vitals tele labs imaging I/O ED provider note, admission H&P  Principal Problem:   Bradycardia Active Problems:   Sinus bradycardia    ASSESSMENT AND PLAN:  Huberta Tompkins is a 68 y.o. female  with a past medical history of persistent atrial fibrillation  with multiple prior ablations and prior watchman implantation 08/2023, s/p MAZE 01/2024, recent CVA 01/2024, hypertension who presented to the ED on 02/19/2024 for lightheadedness, bradycardia. Cardiology was consulted for further evaluation.   # Persistent atrial fibrillation s/p MAZE 01/2024 # S/p Watchman 08/2023 # Recent CVA 01/2024 # Hypertension Patient who recently underwent MAZE for persistent atrial fibrillation and shortly after presented with CVA 01/2024, now presenting with lightheadedness and bradycardia. EKG sinus brady rate 51 bpm. Troponins normal.  -Will not resume home metoprolol.  -Decrease flecainide to 50 mg BID.  -Continue eliquis 5 mg BID.  -Resume amlodipine  2.5 mg daily.  -Continue atorvastatin 80 mg daily.   This patient's plan of care was discussed and created with Dr. Florencio and he is in agreement.  Signed: Danita Bloch, PA-C  02/20/2024, 10:05 AM [facility]

## 2024-02-20 NOTE — Telephone Encounter (Signed)
 Received Provider portion for Xarelto  (J&J) PAP application.

## 2024-02-20 NOTE — Discharge Summary (Signed)
 Physician Discharge Summary   Rachel Whitehead  female DOB: 05/04/1955  FMW:[id number]  PCP: Edman Marsa PARAS, DO  Admit date: 02/19/2024 Discharge date: 02/20/2024  Admitted From: home Disposition:  home CODE STATUS: Full code  Discharge Instructions     Diet - low sodium heart healthy   Complete by: As directed       Hospital Course:  For full details, please see H&P, progress notes, consult notes and ancillary notes.  Briefly,  Rachel Whitehead is a 68 y.o. female with medical history significant of PAF status post cardioversion and recent ablation, status post maze procedure, poor tolerance to anticoagulation due to recurrent severe hematuria, recent stroke, presented with lightheadedness and bradycardia.   Symptomatic bradycardia Near syncope --cardio consulted.  D/c'ed metop. Reduced home flecanide from 100 mg to 50 mg BID. --outpatient cardio f/u.  PAF, status post Watchman procedure status post maze procedure --Heart rate in the sinus rhythm, her Apple Watch also confirmed no episode of A-fib.  No evidence of breakthrough A-fib on ED telemonitoring. --cardio consulted.  D/c'ed metop. Reduced home flecanide from 100 mg to 50 mg BID. - Patient reported that she used to have recurrent bleeding associated with the right sided kidney stone which was removed after urology intervention including lithotripsy.  At this point rebleeding risk is low and the recurrence of stroke appears to be high.  Patient agreed with stay on anticoagulation. --Continue Eliquis   Hx of recent stroke HLD --cont home lipitor  80  HTN --Cont amlodipine  --d/c'ed home Toprol     Discharge Diagnoses:  Principal Problem:   Bradycardia Active Problems:   Sinus bradycardia     Discharge Instructions:  Allergies as of 02/20/2024   No Known Allergies      Medication List     STOP taking these medications    acetaminophen  325 MG tablet Commonly known as: TYLENOL    metoprolol   succinate 25 MG 24 hr tablet Commonly known as: TOPROL -XL       TAKE these medications    amLODipine  2.5 MG tablet Commonly known as: NORVASC  Take 1 tablet (2.5 mg total) by mouth daily.   apixaban  5 MG Tabs tablet Commonly known as: ELIQUIS  Take 1 tablet (5 mg total) by mouth 2 (two) times daily.   atorvastatin  80 MG tablet Commonly known as: LIPITOR  Take 1 tablet (80 mg total) by mouth daily.   flecainide  100 MG tablet Commonly known as: TAMBOCOR  Take 0.5 tablets (50 mg total) by mouth 2 (two) times daily. Reduced from 100 mg. What changed:  how much to take additional instructions   multivitamin tablet Take 1 tablet by mouth daily.         Follow-up Information     Custovic, Sabina, DO Follow up.   Specialty: Cardiology Why: As scheduled Contact information: [address] [place] [address] [phone]                 No Known Allergies   The results of significant diagnostics from this hospitalization (including imaging, microbiology, ancillary and laboratory) are listed below for reference.   Consultations:   Procedures/Studies: CT HEAD WO CONTRAST ( ) Result Date: 02/19/2024 CLINICAL DATA:  Dizziness. Patient on blood thinners. Right leg weakness. EXAM: CT HEAD WITHOUT CONTRAST TECHNIQUE: Contiguous axial images were obtained from the base of the skull through the vertex without intravenous contrast. RADIATION DOSE REDUCTION: This exam was performed according to the departmental dose-optimization program which includes automated exposure control, adjustment of the mA and/or kV according  to patient size and/or use of iterative reconstruction technique. COMPARISON:  CT 01/24/2024, MRI 01/24/2024 FINDINGS: Brain: Ventricles, cisterns and other CSF spaces are within normal. Evidence of mild chronic ischemic microvascular disease. Previously seen left cerebellar small focal infarct significantly smaller on the current exam. Previously  seen small infarct over the high left parietal region not seen by CT. No mass, mass effect, shift of midline structures or acute hemorrhage. No evidence of acute infarction. Vascular: No hyperdense vessel or unexpected calcification. Skull: Normal. Negative for fracture or focal lesion. Sinuses/Orbits: No acute finding. Other: None. IMPRESSION: 1. No acute findings. 2. Mild chronic ischemic microvascular disease. Previously seen small left cerebellar infarct significantly smaller on the current exam. Electronically Signed   By: Toribio Agreste M.D.   On: 02/19/2024 11:32   US  Venous Img Lower Bilateral (DVT) Result Date: 01/25/2024 CLINICAL DATA:  Embolic stroke, concern for DVT EXAM: BILATERAL LOWER EXTREMITY VENOUS DOPPLER ULTRASOUND TECHNIQUE: Gray-scale sonography with graded compression, as well as color Doppler and duplex ultrasound were performed to evaluate the lower extremity deep venous systems from the level of the common femoral vein and including the common femoral, femoral, profunda femoral, popliteal and calf veins including the posterior tibial, peroneal and gastrocnemius veins when visible. The superficial great saphenous vein was also interrogated. Spectral Doppler was utilized to evaluate flow at rest and with distal augmentation maneuvers in the common femoral, femoral and popliteal veins. COMPARISON:  None Available. FINDINGS: RIGHT LOWER EXTREMITY Common Femoral Vein: No evidence of thrombus. Normal compressibility, respiratory phasicity and response to augmentation. Saphenofemoral Junction: No evidence of thrombus. Normal compressibility and flow on color Doppler imaging. Profunda Femoral Vein: No evidence of thrombus. Normal compressibility and flow on color Doppler imaging. Femoral Vein: No evidence of thrombus. Normal compressibility, respiratory phasicity and response to augmentation. Popliteal Vein: No evidence of thrombus. Normal compressibility, respiratory phasicity and response to  augmentation. Calf Veins: No evidence of thrombus. Normal compressibility and flow on color Doppler imaging. LEFT LOWER EXTREMITY Common Femoral Vein: No evidence of thrombus. Normal compressibility, respiratory phasicity and response to augmentation. Saphenofemoral Junction: No evidence of thrombus. Normal compressibility and flow on color Doppler imaging. Profunda Femoral Vein: No evidence of thrombus. Normal compressibility and flow on color Doppler imaging. Femoral Vein: No evidence of thrombus. Normal compressibility, respiratory phasicity and response to augmentation. Popliteal Vein: No evidence of thrombus. Normal compressibility, respiratory phasicity and response to augmentation. Calf Veins: No evidence of thrombus. Normal compressibility and flow on color Doppler imaging. IMPRESSION: No evidence of deep venous thrombosis in either lower extremity. Electronically Signed   By: CHRISTELLA.  Shick M.D.   On: 01/25/2024 16:13   ECHOCARDIOGRAM COMPLETE Result Date: 01/25/2024    ECHOCARDIOGRAM REPORT   Patient Name:   TRAM Marut Date of Exam: 01/25/2024 Medical Rec #:  [id number]    Height:       64.0 in Accession #:    [id number]   Weight:       176.6 lb Date of Birth:  06-26-1955     BSA:          1.856 m Patient Age:    68 years     BP:           153/89 mmHg Patient Gender: F            HR:           59 bpm. Exam Location:  [facility] Procedure: 2D Echo, Cardiac Doppler and Color Doppler (Both Spectral and Color  Flow Doppler were utilized during procedure). Indications:     Stroke I63.9  History:         Patient has no prior history of Echocardiogram examinations.                  Stroke.  Sonographer:     Rosina Dunk Referring Phys:  [id number] CORT ONEIDA MANA Diagnosing Phys: Keller Paterson IMPRESSIONS  1. Left ventricular ejection fraction, by estimation, is 60 to 65%. The left ventricle has normal function. The left ventricle has no regional wall motion abnormalities. Left ventricular diastolic  parameters are consistent with Grade II diastolic dysfunction (pseudonormalization).  2. Right ventricular systolic function is normal. The right ventricular size is normal.  3. The mitral valve is normal in structure. Trivial mitral valve regurgitation.  4. The aortic valve is tricuspid. Aortic valve regurgitation is mild.  5. Aortic dilatation noted. There is mild dilatation of the aortic root, measuring 39 mm.  6. The inferior vena cava is normal in size with greater than 50% respiratory variability, suggesting right atrial pressure of 3 mmHg. FINDINGS  Left Ventricle: Left ventricular ejection fraction, by estimation, is 60 to 65%. The left ventricle has normal function. The left ventricle has no regional wall motion abnormalities. The left ventricular internal cavity size was normal in size. There is  no left ventricular hypertrophy. Left ventricular diastolic parameters are consistent with Grade II diastolic dysfunction (pseudonormalization). The ratio of pulmonic flow to systemic flow (Qp/Qs ratio) is 1.70. Right Ventricle: The right ventricular size is normal. No increase in right ventricular wall thickness. Right ventricular systolic function is normal. Left Atrium: Left atrial size was normal in size. Right Atrium: Right atrial size was normal in size. Pericardium: There is no evidence of pericardial effusion. Mitral Valve: The mitral valve is normal in structure. Trivial mitral valve regurgitation. MV peak gradient, 5.5 mmHg. The mean mitral valve gradient is 2.0 mmHg. Tricuspid Valve: The tricuspid valve is normal in structure. Tricuspid valve regurgitation is mild. Aortic Valve: The aortic valve is tricuspid. Aortic valve regurgitation is mild. Aortic valve mean gradient measures 5.0 mmHg. Aortic valve peak gradient measures 9.9 mmHg. Aortic valve area, by VTI measures 2.76 cm. Pulmonic Valve: The pulmonic valve was not well visualized. Pulmonic valve regurgitation is trivial. Aorta: Aortic dilatation  noted. There is mild dilatation of the aortic root, measuring 39 mm. Venous: The inferior vena cava is normal in size with greater than 50% respiratory variability, suggesting right atrial pressure of 3 mmHg. IAS/Shunts: The atrial septum is grossly normal. The ratio of pulmonic flow to systemic flow (Qp/Qs ratio) is 1.70.  LEFT VENTRICLE PLAX 2D LVIDd:         4.30 cm     Diastology LVIDs:         1.80 cm     LV e' medial:    4.35 cm/s LV PW:         1.10 cm     LV E/e' medial:  21.9 LV IVS:        0.90 cm     LV e' lateral:   8.93 cm/s LVOT diam:     2.10 cm     LV E/e' lateral: 10.7 LV SV:         89 LV SV Index:   48 LVOT Area:     3.46 cm  LV Volumes (MOD) LV vol d, MOD A2C: 51.1 ml LV vol d, MOD A4C: 61.2 ml LV vol s, MOD A2C:  19.8 ml LV vol s, MOD A4C: 20.8 ml LV SV MOD A2C:     31.3 ml LV SV MOD A4C:     61.2 ml LV SV MOD BP:      34.7 ml RIGHT VENTRICLE             IVC RV Basal diam:  4.10 cm     IVC diam: 1.80 cm RV Mid diam:    3.10 cm RV S prime:     11.70 cm/s RVOT diam:      3.00 cm TAPSE (M-mode): 1.4 cm LEFT ATRIUM             Index        RIGHT ATRIUM           Index LA Vol (A2C):   45.6 ml 24.57 ml/m  RA Area:     20.00 cm LA Vol (A4C):   54.3 ml 29.26 ml/m  RA Volume:   54.80 ml  29.53 ml/m LA Biplane Vol: 52.7 ml 28.40 ml/m  AORTIC VALVE                     PULMONIC VALVE AV Area (Vmax):    2.58 cm      PV Area (Vmax):   6.14 cm AV Area (Vmean):   2.63 cm      PV Area (Vmean):  6.68 cm AV Area (VTI):     2.76 cm      PV Area (VTI):    6.73 cm AV Vmax:           157.00 cm/s   PV Vmax:          0.92 m/s AV Vmean:          103.000 cm/s  PV Vmean:         65.900 cm/s AV VTI:            0.323 m       PV VTI:           0.227 m AV Peak Grad:      9.9 mmHg      PV Peak grad:     3.4 mmHg AV Mean Grad:      5.0 mmHg      PV Mean grad:     2.0 mmHg LVOT Vmax:         117.00 cm/s   PR End Diast Vel: 2.85 msec LVOT Vmean:        78.200 cm/s   RVOT Peak grad:   3 mmHg LVOT VTI:          0.257 m  LVOT/AV VTI ratio: 0.80  AORTA Ao Root diam: 3.90 cm Ao Asc diam:  3.50 cm MITRAL VALVE               TRICUSPID VALVE MV Area (PHT): 3.27 cm    TR Peak grad:   22.5 mmHg MV Area VTI:   2.62 cm    TR Mean grad:   16.0 mmHg MV Peak grad:  5.5 mmHg    TR Vmax:        237.00 cm/s MV Mean grad:  2.0 mmHg    TR Vmean:       197.0 cm/s MV Vmax:       1.17 m/s MV Vmean:      57.2 cm/s   SHUNTS MV Decel Time: 232 msec    Systemic VTI:  0.26 m MV E velocity: 95.20  cm/s  Systemic Diam: 2.10 cm MV A velocity: 37.70 cm/s  Pulmonic VTI:  0.216 m MV E/A ratio:  2.53        Pulmonic Diam: 3.00 cm                            Qp/Qs:         1.72 Keller Paterson Electronically signed by Keller Paterson Signature Date/Time: 01/25/2024/1:59:28 PM    Final    MR BRAIN WO CONTRAST Result Date: 01/24/2024 EXAM: MRI BRAIN WITHOUT CONTRAST 01/24/2024 03:27:04 PM TECHNIQUE: Multiplanar multisequence MRI of the head/brain was performed without the administration of intravenous contrast. COMPARISON: Head CT and CTA 01/24/2024 and MRI 10/29/2017. CLINICAL HISTORY: Neuro deficit, acute, stroke suspected. FINDINGS: BRAIN AND VENTRICLES: There is a 1 cm focus of mildly restricted diffusion involving cortex and subcortical white matter in the medial aspect of the left precentral gyrus consistent with an acute infarct. A 1 cm focus of T2 and trace diffusion weighted signal hyperintensity in the left cerebellar hemisphere with normal ADC is most compatible with an early subacute infarct. Patchy T2 hyperintensities elsewhere in the cerebral white matter bilaterally have progressed from the prior MRI and are nonspecific but compatible with moderate chronic small vessel ischemic disease. There is a tiny chronic right cerebellar infarct. Encephalomalacia is again noted in the posteroinferior right temporal lobe. No age advanced cerebral atrophy, mass, midline shift, hydrocephalus, or extra axial fluid collection is evident. Numerous chronic cerebral  microhemorrhages predominantly affect the deep gray nuclei suggestive of chronic hypertension. Major intracranial vascular flow voids are preserved. ORBITS: No acute abnormality. SINUSES AND MASTOIDS: No acute abnormality. BONES AND SOFT TISSUES: Normal marrow signal. No acute soft tissue abnormality. IMPRESSION: 1. Small acute infarct in the left precentral gyrus. 2. Small early subacute left cerebellar infarct. 3. Moderate chronic small vessel ischemic disease. 4. Numerous chronic cerebral microhemorrhages suggestive of chronic hypertension. Electronically signed by: Dasie Hamburg MD 01/24/2024 03:47 PM EDT RP Workstation: [id number]   CT ANGIO HEAD NECK W WO CM (CODE STROKE) Result Date: 01/24/2024 EXAM: CTA HEAD AND NECK WITHOUT AND WITH 01/24/2024 11:31:08 AM TECHNIQUE: CTA of the head and neck was performed without and with the administration of 75 mL of iohexol  (OMNIPAQUE ) 350 MG/ML injection. Multiplanar 2D and/or 3D reformatted images are provided for review. Automated exposure control, iterative reconstruction, and/or weight based adjustment of the mA/kV was utilized to reduce the radiation dose to as low as reasonably achievable. Stenosis of the internal carotid arteries measured using NASCET criteria. COMPARISON: Head CT reported separately today. Brain MRI 10/30/2007. CLINICAL HISTORY: 68 year old female. Neuro deficit, acute, stroke suspected; RLE acute paralysis c/f L ACA acute infarct. FINDINGS: CTA NECK: AORTIC ARCH AND ARCH VESSELS: Normal 3 vessel arch. Mild aortic arch tortuosity. No dissection or arterial injury. No significant stenosis of the brachiocephalic or subclavian arteries. Mildly tortuous proximal subclavian arteries with no plaque or stenosis. CERVICAL CAROTID ARTERIES: Bilateral cervical carotid arteries are mildly to moderately tortuous. No atherosclerosis or stenosis associated. Ectatic appearance of the distal ICAs without beading or irregularity of the vessel walls (series 7,  image 136). No dissection or arterial injury. CERVICAL VERTEBRAL ARTERIES: Bilateral vertebral artery origins are mildly tortuous with no plaque or stenosis. Dominant right vertebral artery is widely patent and mildly ectatic to the skull base. Nondominant left vertebral artery is normal to the skull base. Normal right PICA origin. No dissection or arterial injury. LUNGS AND  MEDIASTINUM: Unremarkable. SOFT TISSUES: No acute abnormality. BONES: Mild for age spine degeneration. No acute abnormality. CTA HEAD: ANTERIOR CIRCULATION: Both ICA siphons are ectatic, more so the right, with no plaque or stenosis. Normal right posterior communicating artery origin. Normal MCA and ACA origin. Diminutive or absent anterior communicating artery. Bilateral ACA branches are within normal limits. Tortuous MCA M1 segments. Patent MCA bifurcations without stenosis. Right MCA branches are within normal limits. Left MCA bifurcation or trifurcation is positive for a tiny saccular aneurysm directed laterally, 1 to 2 mm in size, best seen on series 8, image 140. Left MCA branches otherwise within normal limits. POSTERIOR CIRCULATION: The distal left vertebral artery terminates in PICA. The right vertebral artery supplies the basilar artery. Tortuous distal right vertebral artery without stenosis. Tortuous basilar artery without stenosis. Normal SCA and left PCA origins. Fetal right PCA origin. Diminutive or absent left posterior communicating artery. No significant stenosis of the posterior cerebral arteries. No significant stenosis of the vertebral arteries. No aneurysm. OTHER: Early intracranial venous enhancement. Superior sagittal sinus is patent. No dural venous sinus thrombosis on this non-dedicated study. . These results were communicated to Dr. Matthews at 1139 hours on 01/24/2024 by text page via the [facility] messaging system. IMPRESSION: 1. No large vessel occlusion. Generalized arterial ectasia in the head and neck. Minimal  atherosclerosis. No stenosis 2. 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 3. These results were communicated to Dr. Matthews at 11:39 hours on 01/24/2024 by text page via the [facility] messaging system. Electronically signed by: Helayne Hurst MD 01/24/2024 11:41 AM EDT RP Workstation: [id number]   CT HEAD CODE STROKE WO CONTRAST Result Date: 01/24/2024 EXAM: CT HEAD WITHOUT CONTRAST 01/24/2024 11:09:32 AM TECHNIQUE: CT of the head was performed without the administration of intravenous contrast. Automated exposure control, iterative reconstruction, and/or weight based adjustment of the mA/kV was utilized to reduce the radiation dose to as low as reasonably achievable. COMPARISON: Head CT and MRI 10/29/2017. CLINICAL HISTORY: Neuro deficit, acute, stroke suspected. Right leg numbness and weakness. Code stroke. FINDINGS: BRAIN AND VENTRICLES: A 1 cm hypodensity in the left cerebellar hemisphere is new and suspicious for an acute or subacute infarct. No acute supratentorial infarct, intracranial hemorrhage, mass, midline shift, hydrocephalus, or extra-axial fluid collection is identified. No age-advanced cerebral atrophy is evident. Encephalomalacia in the posteroinferior right temporal lobe is unchanged. Patchy hypodensity elsewhere in the cerebral white matter bilaterally have progressed and are nonspecific but compatible with moderate chronic small vessel ischemic disease. No hyperdense vessel. ORBITS: No acute abnormality. SINUSES: No acute abnormality. SOFT TISSUES AND SKULL: No acute soft tissue abnormality. No skull fracture. [place] Stroke Program Early CT Score (ASPECTS) ----- Ganglionic (caudate, IC, lentiform nucleus, insula, M1-M3): 7 Supraganglionic (M4-M6): 3 Total: 10 IMPRESSION: 1. Small acute/subacute left cerebellar infarct. 2. ASPECTS of 10. No intracranial hemorrhage. 3. Moderate chronic small vessel ischemic disease. 4. These results were communicated to Dr. Matthews at 11:18 AM on  01/24/2024 by secure text page via the [facility] messaging system. Electronically signed by: Dasie Hamburg MD 01/24/2024 11:36 AM EDT RP Workstation: [id number]      Labs: BNP (last 3 results) No results for input(s): BNP in the last 8760 hours. Basic Metabolic Panel: Recent Labs  Lab 02/19/24 1016  NA 140  K 4.6  CL 108  CO2 23  GLUCOSE 90  BUN 11  CREATININE 0.74  CALCIUM  9.4  MG 2.1  PHOS 2.8   Liver Function Tests: Recent Labs  Lab 02/19/24  1016  AST 26  ALT 23  ALKPHOS 80  BILITOT 0.5  PROT 6.7  ALBUMIN 4.2   No results for input(s): LIPASE, AMYLASE in the last 168 hours. No results for input(s): AMMONIA in the last 168 hours. CBC: Recent Labs  Lab 02/19/24 1016  WBC 5.1  NEUTROABS 3.0  HGB 12.5  HCT 38.0  MCV 91.3  PLT 219   Cardiac Enzymes: No results for input(s): CKTOTAL, CKMB, CKMBINDEX, TROPONINI in the last 168 hours. BNP: Invalid input(s): POCBNP CBG: No results for input(s): GLUCAP in the last 168 hours. D-Dimer No results for input(s): DDIMER in the last 72 hours. Hgb A1c No results for input(s): HGBA1C in the last 72 hours. Lipid Profile No results for input(s): CHOL, HDL, LDLCALC, TRIG, CHOLHDL, LDLDIRECT in the last 72 hours. Thyroid  function studies Recent Labs    02/19/24 1016  TSH 1.420   Anemia work up No results for input(s): VITAMINB12, FOLATE, FERRITIN, TIBC, IRON, RETICCTPCT in the last 72 hours. Urinalysis    Component Value Date/Time   COLORURINE COLORLESS (A) 02/19/2024 1100   APPEARANCEUR CLEAR (A) 02/19/2024 1100   LABSPEC 1.003 (L) 02/19/2024 1100   PHURINE 7.0 02/19/2024 1100   GLUCOSEU NEGATIVE 02/19/2024 1100   HGBUR NEGATIVE 02/19/2024 1100   BILIRUBINUR NEGATIVE 02/19/2024 1100   BILIRUBINUR negative 05/09/2023 1346   KETONESUR NEGATIVE 02/19/2024 1100   PROTEINUR NEGATIVE 02/19/2024 1100   UROBILINOGEN 0.2 05/09/2023 1346   NITRITE NEGATIVE 02/19/2024 1100    LEUKOCYTESUR NEGATIVE 02/19/2024 1100   Sepsis Labs Recent Labs  Lab 02/19/24 1016  WBC 5.1   Microbiology No results found for this or any previous visit (from the past 240 hours).   Total time spend on discharging this patient, including the last patient exam, discussing the hospital stay, instructions for ongoing care as it relates to all pertinent caregivers, as well as preparing the medical discharge records, prescriptions, and/or referrals as applicable, is 45 minutes.    Ellouise Haber, MD  [facility] 02/20/2024, 10:28 AM

## 2024-02-20 NOTE — TOC Transition Note (Signed)
 Transition of Care [facility]) - Discharge Note   Patient Details  Name: Rachel Whitehead MRN: [id number] Date of Birth: 04/01/1956  Transition of Care [facility] CM/SW Contact:  Lauraine JAYSON Carpen, LCSW Phone Number: 02/20/2024, 10:40 AM   Clinical Narrative:   Patient has orders to discharge home today. Chart reviewed. Patient is active with [facility]. They are unable to pull up which services in their database right now but confirmed patient will not need new orders to resume services because she is observation status. No further concerns. CSW signing off.  Final next level of care: Home w Home Health Services Barriers to Discharge: No Barriers Identified   Patient Goals and CMS Choice            Discharge Placement                    Patient and family notified of of transfer: 02/20/24  Discharge Plan and Services Additional resources added to the After Visit Summary for                              [facility] Agency: [facility] Date [facility] Agency Contacted: 02/20/24   Representative spoke with at [facility] Agency: [place]   Social Drivers of Health (SDOH) Interventions SDOH Screenings   Food Insecurity: No Food Insecurity (02/19/2024)  Housing: Low Risk  (02/19/2024)  Transportation Needs: No Transportation Needs (02/19/2024)  Utilities: Not At Risk (02/19/2024)  Alcohol Screen: Low Risk  (10/02/2022)  Depression (PHQ2-9): Low Risk  (09/04/2023)  Financial Resource Strain: Low Risk  (06/19/2023)   Received from [facility]  Physical Activity: Sufficiently Active (03/05/2023)  Social Connections: Moderately Isolated (02/19/2024)  Stress: No Stress Concern Present (03/05/2023)  Tobacco Use: Unknown (02/19/2024)  Health Literacy: Adequate Health Literacy (03/05/2023)     Readmission Risk Interventions     No data to display

## 2024-02-24 ENCOUNTER — Other Ambulatory Visit: Payer: Self-pay | Admitting: Family Medicine

## 2024-02-24 DIAGNOSIS — I10 Essential (primary) hypertension: Secondary | ICD-10-CM

## 2024-02-25 NOTE — Telephone Encounter (Signed)
 Hey!   Received approval from Dr. Rosemarie to assist with changing Eliquis  to Xarelto . Wanted to route to both of your attention so that PAP process may be initiated. Per [name] note, looks like patient requested to receive application via email address on record once the change was approved.  Thanks! Jon VEAR Lindau, PharmD Clinical Pharmacist [phone]

## 2024-02-25 NOTE — Telephone Encounter (Signed)
 Requested Prescriptions  Pending Prescriptions Disp Refills   amLODipine  (NORVASC ) 2.5 MG tablet [Pharmacy Med Name: AMLODIPINE  BESYLATE 2.5 MG TAB] 90 tablet 0    Sig: TAKE 1 TABLET BY MOUTH EVERY DAY     Cardiovascular: Calcium  Channel Blockers 2 Failed - 02/25/2024  4:30 PM      Failed - Last BP in normal range    BP Readings from Last 1 Encounters:  02/20/24 (!) 149/80         Passed - Last Heart Rate in normal range    Pulse Readings from Last 1 Encounters:  02/20/24 (!) 49         Passed - Valid encounter within last 6 months    Recent Outpatient Visits           2 weeks ago History of cerebrovascular accident (CVA) with residual deficit   [place] [facility], Marsa PARAS, DO   5 months ago Essential hypertension   [place] [facility] Marsa PARAS, [place]   8 months ago C. difficile colitis   [facility] [place] Marsa PARAS, DO   8 months ago Paresthesia of right lower extremity    [facility] [place] Marsa PARAS, DO   9 months ago Constipation, unspecified constipation type   [facility], Marsa PARAS, [place]

## 2024-03-03 ENCOUNTER — Telehealth: Payer: Self-pay

## 2024-03-03 NOTE — Telephone Encounter (Signed)
 Received TEE results from [facility] dated 02/26/24. Results placed in Dr Bucky office for review.

## 2024-03-03 NOTE — Progress Notes (Signed)
 Thank you! Received message back from PCP on 02/15/2024 advising he is in agreement to switch patient to Xarelto  in 1 month when she finishes current Eliquis . Xarelto  patient assistance program application was sent to patient by CPhT Suzen Mall on 11/14.  Sharyle Sia, PharmD, JAQUELINE, CPP Clinical Pharmacist [facility] [phone]

## 2024-03-03 NOTE — Telephone Encounter (Signed)
 Copied from CRM [id number]. Topic: Clinical - Home Health Verbal Orders >> Mar 03, 2024 10:33 AM Montie POUR wrote: Caller/Agency: Tiffany with [facility] Callback Number: [phone] - Secured voicemail Service Requested: Physical Therapy Frequency: 1 time weekly for 2 weeks Any new concerns about the patient? No

## 2024-03-03 NOTE — Telephone Encounter (Signed)
 Spoke to [employer] order given

## 2024-03-03 NOTE — Telephone Encounter (Signed)
 Okay to proceed w/ verbal orders  Marsa Officer, DO [facility] 03/03/2024, 12:14 PM

## 2024-03-05 ENCOUNTER — Other Ambulatory Visit

## 2024-03-05 DIAGNOSIS — Z0000 Encounter for general adult medical examination without abnormal findings: Secondary | ICD-10-CM

## 2024-03-05 DIAGNOSIS — R7309 Other abnormal glucose: Secondary | ICD-10-CM

## 2024-03-05 DIAGNOSIS — I10 Essential (primary) hypertension: Secondary | ICD-10-CM

## 2024-03-05 DIAGNOSIS — E538 Deficiency of other specified B group vitamins: Secondary | ICD-10-CM

## 2024-03-05 DIAGNOSIS — E559 Vitamin D deficiency, unspecified: Secondary | ICD-10-CM

## 2024-03-05 DIAGNOSIS — E7800 Pure hypercholesterolemia, unspecified: Secondary | ICD-10-CM

## 2024-03-05 DIAGNOSIS — I4819 Other persistent atrial fibrillation: Secondary | ICD-10-CM

## 2024-03-05 NOTE — Telephone Encounter (Signed)
 Spoke with patient and discussed message from Dr Rosemarie below regarding 02/26/24 Echo TEE. Pt said she will be seeing electrophysiology and cardiology on 12/10. She will discuss with them. Patient mentioned there was a TEE done intraoperatively on 10/14 that did not show a leak. She doesn't understand why there is a leak now. She will discuss with cardiology. She wanted to make sure Dr Rosemarie knew about the 10/14 TEE.   I see the results in care everywhere:                                  INTERPRETATION  - Undergoing convergent procedure, but without LAAL  - Normal biventricular function  - Trace to mild MR  - Trace AI, PI, TR  - No PFO  - Watchman device in good position, no flow into LAA

## 2024-03-05 NOTE — Telephone Encounter (Signed)
 Pt called back returning call Inform  Pt will get a call back

## 2024-03-05 NOTE — Telephone Encounter (Signed)
Called pt and LVM (ok per DPR) asking for call back.

## 2024-03-06 NOTE — Telephone Encounter (Signed)
 Received this message from Rosemarie Eather RAMAN, MD to Me (Selected Message)    03/06/24 12:09 PM Ask her to place discussion with the cardiologist who did the Watchman procedure as sometimes the device can retract after a few weeks to months and the leak can develop later   I called the patient and LVM (ok per DPR) informing her of the message above by Dr Rosemarie. Recommended patient speak with her cardiologist about this as we discussed yesterday. She is welcome to call back if needed.   I also sent her a clinical cytogeneticist message.

## 2024-03-10 ENCOUNTER — Other Ambulatory Visit: Admitting: Pharmacist

## 2024-03-10 DIAGNOSIS — I4819 Other persistent atrial fibrillation: Secondary | ICD-10-CM

## 2024-03-10 NOTE — Progress Notes (Signed)
 03/10/2024 Name: Rachel Whitehead MRN: [id number] DOB: 09-17-1955  Chief Complaint  Patient presents with   Medication Management   Medication Assistance    Rachel Whitehead is a 68 y.o. year old female who presented for a telephone visit.   They were referred to the pharmacist by their PCP for assistance in managing medication access.      Subjective:   Care Team: Primary Care Provider: [place] Marsa PARAS, DO ; Next Scheduled Visit: 03/25/2024 Cardiologist: Dewane Shiner, DO; Next Scheduled Visit: 03/12/2024  Cardiac Electrophysiology: Daubert, Lynwood Dover, MD; Next Scheduled Visit: 03/12/2024 Neurologist: Rosemarie Eather RAMAN, MD Urologist: Manuelita Harlene Houston, NP    Medication Access/Adherence  [facility] Pharmacy[facility]  [phone] [address], [place] - [address] [place] [address] Phone: [phone] Fax: [phone]  [facility] - [facility] [address] [place] [address] Phone: [phone] Fax: [phone]   Patient reports affordability concerns with their medications: Yes  Patient reports access/transportation concerns to their pharmacy: No  Patient reports adherence concerns with their medications:  No     From review of chart, note patient admitted to [facility] on 02/19/2024 related to bradycardia/dizziness. Patient discharged on 11/19 and advised: - Stop metoprolol  - Reduce flecainide  from 100 mg to 50 mg BID   Today patient confirms that she discontinued metoprolol  as directed and also initially reduced flecainide  dose as directed. However, reports that she self-increased flecainide  dose back to 100 mg twice daily over the weekend as felt symptoms that she was back in atrial fibrillation. States that she is contacting Cardiology this morning to discuss flecainide  dose/plan. Note that she also has upcoming appointment with Cardiology/Cardiac Electrophysiology on 03/12/2024      Atrial Fibrillation s/p Watchman Device (placed 06/2023)/ Stroke Prevention:   Patient followed by [facility] Cardiology & Electrophysiology    Current medications: Rhythm Control: flecainide  100 mg twice daily   Note patient self-increased back to current dose, but contacting Cardiology about this today Anticoagulation Regimen: Eliquis  5 mg twice daily Today reports she has an 8 day supply of Eliquis  remaining   Medications tried in the past: Xarelto , Eliquis    Statin Therapy: atorvastatin  80 mg daily (started 01/26/2024)   Uses upper arm monitor Current blood pressure/heart rate readings: Last checked: Today: 140/79, HR 67 (before medication)  Denies any signs of hypotension such as dizziness or lightheadedness since stopped metprolol  Current Medication Access Support: Collaborating with CPhT and prescriber to help patient to apply for Xarelto  patient assistance program. Note plan is to switch patient from Eliquis  back to Xarelto  (previously on this medication) in the future for affordability as discussed with both PCP and Neurologist - From review of chart, note CPhT emailed application to patient on 02/15/2024 - Today patient confirms that she received this email, but that she has not yet completed/mailed back to CPhT as was wanting to first wait for appointment with Cardiologist on 12/10 before completing. - Patient currently has an 8 day supply of Eliquis  remaining    Objective:  Lab Results  Component Value Date   CREATININE 0.74 02/19/2024   BUN 11 02/19/2024   NA 140 02/19/2024   K 4.6 02/19/2024   CL 108 02/19/2024   CO2 23 02/19/2024    BP Readings from Last 3 Encounters:  02/20/24 (!) 149/80  02/13/24 (!) 159/95  02/06/24 124/76   Pulse Readings from Last 3 Encounters:  02/20/24 (!) 49  02/13/24 79  02/06/24 ROLLEN)  54    Current Outpatient Medications on File Prior to Visit  Medication Sig Dispense Refill   amLODipine  (NORVASC ) 2.5 MG tablet TAKE  1 TABLET BY MOUTH EVERY DAY 90 tablet 0   apixaban  (ELIQUIS ) 5 MG TABS tablet Take 1 tablet (5 mg total) by mouth 2 (two) times daily. 60 tablet 3   atorvastatin  (LIPITOR ) 80 MG tablet Take 1 tablet (80 mg total) by mouth daily. 30 tablet 2   flecainide  (TAMBOCOR ) 100 MG tablet Take 0.5 tablets (50 mg total) by mouth 2 (two) times daily. Reduced from 100 mg.     Multiple Vitamin (MULTIVITAMIN) tablet Take 1 tablet by mouth daily.     No current facility-administered medications on file prior to visit.     Assessment/Plan:   Patient contacting Cardiology this morning to discuss her flecainide  dose/symptoms over the weekend  Atrial Fibrillation: - Reviewed importance of adherence to anticoagulant for stroke prevention. - Reviewed appropriate blood pressure monitoring technique and reviewed goal blood pressure. - Recommend to monitor home blood pressure, keep log of results and have this record to review at upcoming medical appointments. Patient to contact provider office sooner if needed for readings outside of established parameters or symptoms   Follow Up Plan: Clinical Pharmacist will follow up with patient by telephone on 03/14/2024 at 9:00 AM    Sharyle Sia, PharmD, JAQUELINE, CPP Clinical Pharmacist [facility] [phone]

## 2024-03-10 NOTE — Patient Instructions (Signed)
 Please follow up with [facility] Cardiology today as planned to discuss you medication dose/recent symptoms   Check your blood pressure once daily, and any time you have concerning symptoms like headache, chest pain, dizziness, shortness of breath, or vision changes.      To appropriately check your blood pressure, make sure you do the following:  1) Avoid caffeine, exercise, or tobacco products for 30 minutes before checking. Empty your bladder. 2) Sit with your back supported in a flat-backed chair. Rest your arm on something flat (arm of the chair, table, etc). 3) Sit still with your feet flat on the floor, resting, for at least 5 minutes.  4) Check your blood pressure. Take 1-2 readings.  5) Write down these readings and bring with you to any provider appointments.   Bring your home blood pressure machine with you to a provider's office for accuracy comparison at least once a year.    Make sure you take your blood pressure medications before you come to any office visit, even if you were asked to fast for labs.   Sharyle Sia, PharmD, [facility] [phone]

## 2024-03-14 ENCOUNTER — Other Ambulatory Visit

## 2024-03-14 DIAGNOSIS — I4819 Other persistent atrial fibrillation: Secondary | ICD-10-CM

## 2024-03-14 NOTE — Patient Instructions (Signed)
Check your blood pressure once daily, and any time you have concerning symptoms like headache, chest pain, dizziness, shortness of breath, or vision changes.   To appropriately check your blood pressure, make sure you do the following:  1) Avoid caffeine, exercise, or tobacco products for 30 minutes before checking. Empty your bladder. 2) Sit with your back supported in a flat-backed chair. Rest your arm on something flat (arm of the chair, table, etc). 3) Sit still with your feet flat on the floor, resting, for at least 5 minutes.  4) Check your blood pressure. Take 1-2 readings.  5) Write down these readings and bring with you to any provider appointments.  Bring your home blood pressure machine with you to a provider's office for accuracy comparison at least once a year.   Make sure you take your blood pressure medications before you come to any office visit, even if you were asked to fast for labs.  Fredie Majano Rozalia Dino, PharmD, BCACP [facility] [phone]  

## 2024-03-14 NOTE — Progress Notes (Signed)
 03/14/2024 Name: Rachel Whitehead MRN: [id number] DOB: 06-11-1955  Chief Complaint  Patient presents with   Medication Assistance    Rachel Whitehead is a 68 y.o. year old female who presented for a telephone visit.   They were referred to the pharmacist by their PCP for assistance in managing medication access.      Subjective:   Care Team: Primary Care Provider: [place] Marsa PARAS, DO ; Next Scheduled Visit: 03/25/2024 Cardiologist: Dewane Shiner, DO; Next Scheduled Visit: 05/14/2024  Cardiac Electrophysiology: Melchor Lynwood Dover, MD; Next Scheduled Visit: 05/01/2024  Cardioversion scheduled for 03/19/2024  Neurologist: Rosemarie Eather RAMAN, MD Urologist: Manuelita Harlene Houston, NP  Medication Access/Adherence  [phone] [address], [place] - [address] [place] [address] Phone: [phone] Fax: [phone]  [facility] [address] [place] [address] Phone: [phone] Fax: [phone]   Patient reports affordability concerns with their medications: Yes  Patient reports access/transportation concerns to their pharmacy: No  Patient reports adherence concerns with their medications:  No      Atrial Fibrillation s/p Watchman Device (placed 06/2023)/ Stroke Prevention:   Patient followed by [facility] Card[facility] & Electrophysiology    Current medications: Rhythm Control: flecainide  100 mg twice daily  Anticoagulation Regimen: Eliquis  5 mg twice daily Plans to switch to Xarelto  20 mg daily with dinner, after uses up current supply of Eliquis  (as discussed with Cardiologist) Note Cardiologist sent the prescription for Xarelto  to local pharmacy for patient on 03/12/2024   Medications tried in the past: Xarelto , Eliquis     Statin Therapy: atorvastatin  80 mg daily (started 01/26/2024)   Uses upper arm monitor; last checked in office on 12/10     Current Medication  Access Support: Collaborating with CPhT and prescriber to help patient to apply for Xarelto  patient assistance program. Note plan is to switch patient from Eliquis  back to Xarelto  (previously on this medication) for affordability as discussed with PCP/Neurologist/Cardiologist - From review of chart, note CPhT emailed application to patient on 02/15/2024 - Patient plans to complete and mail back to CPhT - In meantime, patient found that she has already used the once per lifetime 30 day free trial card for Xarelto  from manufacturer. Instead plans to purchase 65-month supply through her insurance while waiting on approval through assistance program  Objective:    Lab Results  Component Value Date   CREATININE 0.74 02/19/2024   BUN 11 02/19/2024   NA 140 02/19/2024   K 4.6 02/19/2024   CL 108 02/19/2024   CO2 23 02/19/2024    Lab Results  Component Value Date   CHOL 212 (H) 01/25/2024   HDL 57 01/25/2024   LDLCALC 143 (H) 01/25/2024   TRIG 62 01/25/2024   CHOLHDL 3.7 01/25/2024   BP Readings from Last 3 Encounters:  02/20/24 (!) 149/80  02/13/24 (!) 159/95  02/06/24 124/76   Pulse Readings from Last 3 Encounters:  02/20/24 (!) 49  02/13/24 79  02/06/24 (!) 54     Medications Reviewed Today     Reviewed by Alana Sharyle LABOR, RPH-CPP (Pharmacist) on 03/14/24 at [phone]  Med List Status: <None>   Medication Order Taking? Sig Documenting Provider Last Dose Status Informant  amLODipine  (NORVASC ) 2.5 MG tablet [id number]  TAKE 1 TABLET BY MOUTH EVERY DAY Karamalegos, Marsa PARAS, DO  Active   apixaban  (ELIQUIS ) 5 MG TABS tablet [id number]  Take 1 tablet (5 mg total) by mouth 2 (  two) times daily. Sethi, Pramod S, MD  Active Self  atorvastatin  (LIPITOR ) 80 MG tablet [id number]  Take 1 tablet (80 mg total) by mouth daily. Awanda City, MD  Active Self  flecainide  (TAMBOCOR ) 100 MG tablet [id number] Yes Take 0.5 tablets (50 mg total) by mouth 2 (two) times daily. Reduced from 100 mg.   Patient taking differently: Take 100 mg by mouth 2 (two) times daily. Reduced from 100 mg.   Awanda City, MD  Active   Multiple Vitamin (MULTIVITAMIN) tablet [id number]  Take 1 tablet by mouth daily. [provider]  Active Self              Assessment/Plan:   Atrial Fibrillation: - Reviewed importance of adherence to anticoagulant for stroke prevention. Also discuss importance of continuing to monitor for any signs of bleeding or bruising while on anticoagulant. - Reviewed appropriate blood pressure monitoring technique and reviewed goal blood pressure. - Recommend to monitor home blood pressure, keep log of results and have this record to review at upcoming medical appointments. Patient to contact provider office sooner if needed for readings outside of established parameters or symptoms - Collaborating with CPhT and prescriber to help patient to apply for Xarelto  patient assistance program. Note plan is to switch patient from Eliquis  back to Xarelto  (previously on this medication) in the future for affordability as discussed with PCP/Neurologist/Cardiologist   Follow Up Plan: Clinical Pharmacist will follow up with patient by telephone on 04/18/2024 at 8:30 AM    Sharyle Sia, PharmD, JAQUELINE, CPP Clinical Pharmacist [facility] [phone]

## 2024-03-18 ENCOUNTER — Other Ambulatory Visit

## 2024-03-18 MED ORDER — SODIUM CHLORIDE 0.9 % IV SOLN: INTRAVENOUS | Status: DC

## 2024-03-18 NOTE — Telephone Encounter (Signed)
 Reached out to patient regarding PAP application for Xarelto  and she did receive it and will try to e-mail it back to me this week.

## 2024-03-19 ENCOUNTER — Other Ambulatory Visit: Payer: Self-pay

## 2024-03-19 ENCOUNTER — Encounter: Admission: RE | Disposition: A | Payer: Self-pay | Attending: Internal Medicine

## 2024-03-19 ENCOUNTER — Encounter: Payer: Self-pay | Admitting: Internal Medicine

## 2024-03-19 ENCOUNTER — Ambulatory Visit: Admitting: Anesthesiology

## 2024-03-19 ENCOUNTER — Ambulatory Visit
Admission: RE | Admit: 2024-03-19 | Discharge: 2024-03-19 | Disposition: A | Discharge status: Home / Self Care | Attending: Internal Medicine | Admitting: Internal Medicine

## 2024-03-19 DIAGNOSIS — I4819 Other persistent atrial fibrillation: Secondary | ICD-10-CM | POA: Diagnosis not present

## 2024-03-19 DIAGNOSIS — I509 Heart failure, unspecified: Secondary | ICD-10-CM | POA: Diagnosis not present

## 2024-03-19 DIAGNOSIS — Z7901 Long term (current) use of anticoagulants: Secondary | ICD-10-CM | POA: Diagnosis not present

## 2024-03-19 DIAGNOSIS — Z79899 Other long term (current) drug therapy: Secondary | ICD-10-CM | POA: Insufficient documentation

## 2024-03-19 DIAGNOSIS — I4891 Unspecified atrial fibrillation: Secondary | ICD-10-CM | POA: Diagnosis present

## 2024-03-19 DIAGNOSIS — Z8673 Personal history of transient ischemic attack (TIA), and cerebral infarction without residual deficits: Secondary | ICD-10-CM | POA: Insufficient documentation

## 2024-03-19 DIAGNOSIS — I10 Essential (primary) hypertension: Secondary | ICD-10-CM | POA: Diagnosis not present

## 2024-03-19 HISTORY — PX: CARDIOVERSION: SHX1299

## 2024-03-19 SURGERY — CARDIOVERSION
Anesthesia: General

## 2024-03-19 MED ORDER — RIVAROXABAN 15 MG PO TABS: 15.0000 mg | ORAL_TABLET | Freq: Every day | ORAL | 0 refills | Status: DC

## 2024-03-19 MED ORDER — PROPOFOL 10 MG/ML IV BOLUS
INTRAVENOUS | Status: DC | PRN
  Administered 2024-03-19: 14:00:00 10 mg via INTRAVENOUS
  Administered 2024-03-19: 14:00:00 50 mg via INTRAVENOUS

## 2024-03-19 NOTE — Anesthesia Preprocedure Evaluation (Signed)
 Anesthesia Evaluation  Patient identified by MRN, date of birth, ID band Patient awake    Reviewed: Allergy & Precautions, NPO status , Patient's Chart, lab work & pertinent test results  History of Anesthesia Complications Negative for: history of anesthetic complications  Airway Mallampati: III  TM Distance: <3 FB Neck ROM: full    Dental  (+) Chipped   Pulmonary neg shortness of breath, sleep apnea    Pulmonary exam normal        Cardiovascular hypertension, +CHF  + dysrhythmias Atrial Fibrillation      Neuro/Psych CVA  negative psych ROS   GI/Hepatic negative GI ROS, Neg liver ROS,neg GERD  ,,  Endo/Other  negative endocrine ROS    Renal/GU Renal disease  negative genitourinary   Musculoskeletal   Abdominal   Peds  Hematology negative hematology ROS (+)   Anesthesia Other Findings Past Medical History: No date: Hypertension No date: Urinary incontinence  Past Surgical History: 02/14/2022: CARDIOVERSION; N/A     Comment:  Procedure: CARDIOVERSION;  Surgeon: Ammon Blunt, MD;  Location: [facility] ORS;  Service:               Cardiovascular;  Laterality: N/A; 02/28/2022: CARDIOVERSION; N/A     Comment:  Procedure: CARDIOVERSION;  Surgeon: Ammon Blunt, MD;  Location: [facility] ORS;  Service:               Cardiovascular;  Laterality: N/A; 03/30/2022: CARDIOVERSION; N/A     Comment:  Procedure: CARDIOVERSION;  Surgeon: Ammon Blunt, MD;  Location: [facility] ORS;  Service:               Cardiovascular;  Laterality: N/A; 10/19/2022: CARDIOVERSION; N/A     Comment:  Procedure: CARDIOVERSION;  Surgeon: Dewane Shiner,               DO;  Location: [facility] ORS;  Service: Cardiovascular;                Laterality: N/A; No date: STRABISMUS SURGERY; Left     Comment:  1963  BMI    Body Mass Index: 30.04 kg/m      Reproductive/Obstetrics negative OB ROS                               Anesthesia Physical Anesthesia Plan  ASA: 3  Anesthesia Plan: General   Post-op Pain Management:    Induction: Intravenous  PONV Risk Score and Plan: Propofol  infusion and TIVA  Airway Management Planned: Natural Airway and Nasal Cannula  Additional Equipment:   Intra-op Plan:   Post-operative Plan:   Informed Consent: I have reviewed the patients History and Physical, chart, labs and discussed the procedure including the risks, benefits and alternatives for the proposed anesthesia with the patient or authorized representative who has indicated his/her understanding and acceptance.     Dental Advisory Given  Plan Discussed with: Anesthesiologist, CRNA and Surgeon  Anesthesia Plan Comments: (Patient consented for risks of anesthesia including but not limited to:  - adverse reactions to medications - risk of airway placement if required - damage to eyes, teeth, lips or other oral mucosa - nerve damage due to positioning  - sore throat or hoarseness - Damage  to heart, brain, nerves, lungs, other parts of body or loss of life  Patient voiced understanding and assent.)        Anesthesia Quick Evaluation

## 2024-03-19 NOTE — Anesthesia Postprocedure Evaluation (Signed)
 Anesthesia Post Note  Patient: Rachel Whitehead  Procedure(s) Performed: CARDIOVERSION  Patient location during evaluation: Specials Recovery Anesthesia Type: General Level of consciousness: awake and alert Pain management: pain level controlled Vital Signs Assessment: post-procedure vital signs reviewed and stable Respiratory status: spontaneous breathing, nonlabored ventilation, respiratory function stable and patient connected to nasal cannula oxygen Cardiovascular status: blood pressure returned to baseline and stable Postop Assessment: no apparent nausea or vomiting Anesthetic complications: no   No notable events documented.   Last Vitals:  Vitals:   03/19/24 1400 03/19/24 1415  BP: (!) 145/83 (!) 149/77  Pulse:  63  Resp: 10 18  Temp:  36.7 C  SpO2: 97% 97%    Last Pain:  Vitals:   03/19/24 1415  TempSrc: Temporal  PainSc: 0-No pain                 Fairy POUR Aleksa Collinsworth

## 2024-03-19 NOTE — Anesthesia Procedure Notes (Signed)
 Procedure Name: MAC Date/Time: 03/19/2024 1:33 PM  Performed by: Lacretia Camelia NOVAK, CRNAPre-anesthesia Checklist: Patient identified, Emergency Drugs available, Suction available and Patient being monitored Patient Re-evaluated:Patient Re-evaluated prior to induction Oxygen Delivery Method: Nasal cannula

## 2024-03-19 NOTE — CV Procedure (Signed)
 Direct current cardioversion 03/19/2024 2:00 PM  Indication symptomatic A. Fibrillation.  Procedure: Using IV Propofol  and IV Lidocaine (for reducing venous pain) for achieving deep sedation, synchronized direct current cardioversion performed. Patient was delivered with 200 Joules of electricity X 1 with success to NSR. Patient tolerated the procedure well. No immediate complication noted.   Patient stable for d/c home. Keep regularly scheduled cardio appts.  No med changes.   Anubis Fundora, DO 2:01 PM 03/19/2024

## 2024-03-19 NOTE — Transfer of Care (Signed)
 Immediate Anesthesia Transfer of Care Note  Patient: Rachel Whitehead  Procedure(s) Performed: CARDIOVERSION  Patient Location: PACU and special recoveries  Anesthesia Type:General  Level of Consciousness: drowsy and patient cooperative  Airway & Oxygen Therapy: Patient Spontanous Breathing and Patient connected to nasal cannula oxygen  Post-op Assessment: Report given to RN and Post -op Vital signs reviewed and stable  Post vital signs: Reviewed and stable  Last Vitals:  Vitals Value Taken Time  BP 162/98 03/19/24 13:42  Temp    Pulse 65 03/19/24 13:43  Resp 19 03/19/24 13:43  SpO2 94 % 03/19/24 13:43    Last Pain:  Vitals:   03/19/24 1257  TempSrc: Oral  PainSc: 0-No pain         Complications: No notable events documented.

## 2024-03-20 ENCOUNTER — Other Ambulatory Visit

## 2024-03-20 ENCOUNTER — Encounter: Payer: Self-pay | Admitting: Internal Medicine

## 2024-03-20 NOTE — Telephone Encounter (Signed)
 Pt called requesting to speak to Suzen Mall, she wants her email  Best contact: [phone]

## 2024-03-20 NOTE — Telephone Encounter (Signed)
 PAP: Application for Xarelto  has been submitted to [employer] via fax

## 2024-03-21 ENCOUNTER — Ambulatory Visit: Payer: Self-pay | Admitting: Family Medicine

## 2024-03-21 ENCOUNTER — Encounter: Admitting: Family Medicine

## 2024-03-25 ENCOUNTER — Encounter: Admitting: Family Medicine

## 2024-03-25 ENCOUNTER — Encounter: Payer: Self-pay | Admitting: Family Medicine

## 2024-03-25 VITALS — BP 140/82 | HR 58 | Ht 64.0 in | Wt 180.4 lb

## 2024-03-25 DIAGNOSIS — I5032 Chronic diastolic (congestive) heart failure: Secondary | ICD-10-CM | POA: Diagnosis not present

## 2024-03-25 DIAGNOSIS — E7800 Pure hypercholesterolemia, unspecified: Secondary | ICD-10-CM | POA: Diagnosis not present

## 2024-03-25 DIAGNOSIS — I6930 Unspecified sequelae of cerebral infarction: Secondary | ICD-10-CM

## 2024-03-25 DIAGNOSIS — N3946 Mixed incontinence: Secondary | ICD-10-CM | POA: Diagnosis not present

## 2024-03-25 DIAGNOSIS — I69351 Hemiplegia and hemiparesis following cerebral infarction affecting right dominant side: Secondary | ICD-10-CM

## 2024-03-25 DIAGNOSIS — I10 Essential (primary) hypertension: Secondary | ICD-10-CM

## 2024-03-25 DIAGNOSIS — Z7901 Long term (current) use of anticoagulants: Secondary | ICD-10-CM | POA: Diagnosis not present

## 2024-03-25 DIAGNOSIS — Z9289 Personal history of other medical treatment: Secondary | ICD-10-CM | POA: Diagnosis not present

## 2024-03-25 DIAGNOSIS — Z0000 Encounter for general adult medical examination without abnormal findings: Secondary | ICD-10-CM | POA: Diagnosis not present

## 2024-03-25 DIAGNOSIS — G4733 Obstructive sleep apnea (adult) (pediatric): Secondary | ICD-10-CM | POA: Diagnosis not present

## 2024-03-25 DIAGNOSIS — I4819 Other persistent atrial fibrillation: Secondary | ICD-10-CM | POA: Diagnosis not present

## 2024-03-25 NOTE — Patient Instructions (Addendum)
 Thank you for coming to the office today.  Dose increase Amlodipine  trial 2.5mg  x 2 = 5mg  daily, if successful BP < 140 consistently then we can order 5mg  Caution mild swelling  Okay to reduce Atorvastatin  from 80 down to 40mg   Future Mammogram + Bone Density Imaging -   For Mammogram screening for breast cancer and DEXA Scan (Bone mineral density) screening for osteoporosis  CALL or MESSAGE our office to coordinate the orders for these  AFTER ordering then Call the Imaging Center below anytime to schedule your own appointment now that order has been placed.  [facility] [address], [address] [place], [place] [address] Phone: [phone]    Please schedule a Follow-up Appointment to: Return in about 22 days (around 04/16/2024) for follow-up 3-4 weeks Updates, Cardio, Neuro, BP / Cholesterol.  If you have any other questions or concerns, please feel free to call the office or send a message through MyChart. You may also schedule an earlier appointment if necessary.  Additionally, you may be receiving a survey about your experience at our office within a few days to 1 week by e-mail or mail. We value your feedback.  Marsa Officer, DO [facility] [place], [place]

## 2024-03-25 NOTE — Progress Notes (Signed)
 "  Subjective:    Patient ID: Rachel Whitehead, female    DOB: 08-01-1955, 68 y.o.   MRN: [id number]  Rachel Whitehead is a 68 y.o. female presenting on 03/25/2024 for Annual Exam   HPI  Discussed the use of AI scribe software for clinical note transcription with the patient, who gave verbal consent to proceed.  History of Present Illness   Rachel Whitehead is a 68 year old female who presents for an annual physical exam. She is accompanied by her daughter.  Cholesterol management and statin intolerance - Currently taking atorvastatin  80 mg for cholesterol management, initially prescribed due to stroke risk - Expresses desire to discontinue statin due to concerns about potential side effects, specifically 'brain fog' - Awaiting results from an advanced lipid panel for further cholesterol assessment  Atrial fibrillation and anticoagulation - History of atrial fibrillation - Underwent cardioversion on March 19, 2024 in sinus rhythm - Currently taking Xarelto  15 mg daily for anticoagulation - Upcoming Eylea TEE scheduled for April 17, 2024, to assess closure of a previously identified cardiac defect  Blood pressure fluctuations and antihypertensive therapy - Blood pressure readings have been fluctuating, with recent measurements around 140/80 mmHg - Currently taking amlodipine  2.5 mg for blood pressure control - Previously on a higher dose of amlodipine , which was reduced due to well-controlled blood pressure - Occasional lightheadedness, attributed to other factors  Liver and renal function abnormalities - History of elevated liver enzymes, currently slightly higher than previous levels - Attributes elevated liver enzymes and BUN to possible dehydration during fasting for a recent procedure  Urinary urge incontinence Followed by GYN - Currently undergoing posterior tibial nerve stimulation therapy - Finds the therapy beneficial  Recent laboratory findings - Recent A1c of 5.4 (normal) -  Normal thyroid  function - No anemia detected      CHF Diastolic Grade 2 On ECHO previously diagnosed. asymptomatic  Cerebrovascular accident sequelae / Left Cerebellar ischemic stroke - Left cerebellar stroke occurred on January 24, 2024, presenting with sudden right leg weakness and complete loss of function initially - Gradual return of some right leg strength, but persistent difference in sensation and function compared to baseline - Difficulty with prolonged standing and wearing shoes due to right leg symptoms - She also has some residual brain fog, dizziness, lightheadedness, balance problems. Followed by Neurology and Cardiology currently       Health Maintenance:  Decline Flu Vaccine, Prevnar-20 vaccine,        03/25/2024    8:09 AM 09/04/2023    8:32 AM 06/04/2023   11:31 AM  Depression screen PHQ 2/9  Decreased Interest 0 0 1  Down, Depressed, Hopeless 0 0 0  PHQ - 2 Score 0 0 1  Altered sleeping  2 1  Tired, decreased energy  1 2  Change in appetite  0 2  Feeling bad or failure about yourself   0 0  Trouble concentrating  1 1  Moving slowly or fidgety/restless  0 0  Suicidal thoughts  0 0  PHQ-9 Score  4  7   Difficult doing work/chores  Somewhat difficult Very difficult     Data saved with a previous flowsheet row definition       03/25/2024    8:09 AM 09/04/2023    8:32 AM 06/04/2023   11:31 AM 05/25/2023    2:32 PM  GAD 7 : Generalized Anxiety Score  Nervous, Anxious, on Edge 0 0 0 0  Control/stop worrying 0 0 0 0  Worry too much - different things 0 0 0 0  Trouble relaxing 0 1 0 0  Restless 0 0 0 0  Easily annoyed or irritable 0 0 0 0  Afraid - awful might happen 0 0 0 0  Total GAD 7 Score 0 1 0 0  Anxiety Difficulty  Somewhat difficult Not difficult at all      Past Medical History:  Diagnosis Date   Hypertension    Urinary incontinence    Past Surgical History:  Procedure Laterality Date   CARDIOVERSION N/A 02/14/2022   Procedure:  CARDIOVERSION;  Surgeon: Ammon Blunt, MD;  Location: [facility] ORS;  Service: Cardiovascular;  Laterality: N/A;   CARDIOVERSION N/A 02/28/2022   Procedure: CARDIOVERSION;  Surgeon: Ammon Blunt, MD;  Location: [facility] OR[facility];  Service: Cardiovascular;  Laterality: N/A;   CARDIOVERSION N/A 03/30/2022   Procedure: CARDIOVERSION;  Surgeon: Ammon Blunt, MD;  Location: [facility] OR[facility];  Service: Cardiovascular;  Laterality: N/A;   CARDIOVERSION N/A 10/19/2022   Procedure: CARDIOVERSION;  Surgeon: Dewane Shiner, DO;  Location: [facility] OR[facility];  Service: Cardiovascular;  Laterality: N/A;   CARDIOVERSION N/A 03/19/2024   Procedure: CARDIOVERSION;  Surgeon: Dewane Shiner, DO;  Location: [facility] OR[facility];  Service: Cardiovascular;  Laterality: N/A;   STRABISMUS SURGERY Left    1961   Social History   Socioeconomic History   Marital status: Single    Spouse name: Not on file   Number of children: Not on file   Years of education: Not on file   Highest education level: Not on file  Occupational History   Not on file  Tobacco Use   Smoking status: Never   Smokeless tobacco: Not on file  Vaping Use   Vaping status: Never Used  Substance and Sexual Activity   Alcohol use: Never    Alcohol/week: 4.0 standard drinks of alcohol    Types: 4 Standard drinks or equivalent per week   Drug use: Never   Sexual activity: Not on file  Other Topics Concern   Not on file  Social History Narrative   Not on file   Social Drivers of Health   Tobacco Use: Unknown (03/25/2024)   Patient History    Smoking Tobacco Use: Never    Smokeless Tobacco Use: Unknown    Passive Exposure: Not on file  Financial Resource Strain: Low Risk  (06/19/2023)   Received from [facility]   Overall Financial Resource Strain (CARDIA)    Difficulty of Paying Living Expenses: Not hard at all  Food Insecurity: No Food Insecurity (02/19/2024)   Epic    Worried About Radiation Protection Practitioner of Food in the Last Year: Never  true    Ran Out of Food in the Last Year: Never true  Transportation Needs: No Transportation Needs (02/19/2024)   Epic    Lack of Transportation (Medical): No    Lack of Transportation (Non-Medical): No  Physical Activity: Sufficiently Active (03/05/2023)   Exercise Vital Sign    Days of Exercise per Week: 4 days    Minutes of Exercise per Session: 60 min  Stress: No Stress Concern Present (03/05/2023)   [employer] of Occupational Health - Occupational Stress Questionnaire    Feeling of Stress : Only a little  Social Connections: Moderately Isolated (02/19/2024)   Social Connection and Isolation Panel    Frequency of Communication with Friends and Family: More than three times a week    Frequency of Social Gatherings with Friends and Family: More than three times a week  Attends Religious Services: 1 to 4 times per year    Active Member of Clubs or Organizations: No    Attends Banker Meetings: Never    Marital Status: Divorced  Catering Manager Violence: Not At Risk (02/19/2024)   Epic    Fear of Current or Ex-Partner: No    Emotionally Abused: No    Physically Abused: No    Sexually Abused: No  Depression (PHQ2-9): Low Risk (03/25/2024)   Depression (PHQ2-9)    PHQ-2 Score: 0  Alcohol Screen: Low Risk (10/02/2022)   Alcohol Screen    Last Alcohol Screening Score (AUDIT): 0  Housing: Low Risk (02/19/2024)   Epic    Unable to Pay for Housing in the Last Year: No    Number of Times Moved in the Last Year: 0    Homeless in the Last Year: No  Utilities: Not At Risk (02/19/2024)   Epic    Threatened with loss of utilities: No  Health Literacy: Adequate Health Literacy (03/05/2023)   B1300 Health Literacy    Frequency of need for help with medical instructions: Never   Family History  Problem Relation Age of Onset   Cancer Father    Breast cancer Sister 65   Current Outpatient Medications on File Prior to Visit  Medication Sig   flecainide  (TAMBOCOR )  100 MG tablet Take 0.5 tablets (50 mg total) by mouth 2 (two) times daily. Reduced from 100 mg. (Patient taking differently: Take 100 mg by mouth 2 (two) times daily. Reduced from 100 mg.)   Multiple Vitamin (MULTIVITAMIN) tablet Take 1 tablet by mouth daily.   Rivaroxaban  (XARELTO ) 15 MG TABS tablet Take 1 tablet (15 mg total) by mouth daily with supper.   amLODipine  (NORVASC ) 2.5 MG tablet Take 2 tablets (5 mg total) by mouth daily.   atorvastatin  (LIPITOR ) 80 MG tablet Take 0.5 tablets (40 mg total) by mouth daily.   No current facility-administered medications on file prior to visit.    Review of Systems  Constitutional:  Negative for activity change, appetite change, chills, diaphoresis, fatigue and fever.  HENT:  Negative for congestion and hearing loss.   Eyes:  Negative for visual disturbance.  Respiratory:  Negative for cough, chest tightness, shortness of breath and wheezing.   Cardiovascular:  Negative for chest pain, palpitations and leg swelling.  Gastrointestinal:  Negative for abdominal pain, constipation, diarrhea, nausea and vomiting.  Genitourinary:  Negative for dysuria, frequency and hematuria.  Musculoskeletal:  Negative for arthralgias and neck pain.  Skin:  Negative for rash.  Neurological:  Negative for dizziness, weakness, light-headedness, numbness and headaches.  Hematological:  Negative for adenopathy.  Psychiatric/Behavioral:  Negative for behavioral problems, dysphoric mood and sleep disturbance.    Per HPI unless specifically indicated above     Objective:    BP (!) 140/82 (BP Location: Left Arm, Cuff Size: Normal)   Pulse (!) 58   Ht 5' 4 (1.626 m)   Wt 180 lb 6 oz (81.8 kg)   SpO2 97%   BMI 30.96 kg/m   Wt Readings from Last 3 Encounters:  03/25/24 180 lb 6 oz (81.8 kg)  03/19/24 175 lb (79.4 kg)  02/19/24 172 lb (78 kg)    Physical Exam Vitals and nursing note reviewed.  Constitutional:      General: She is not in acute distress.     Appearance: She is well-developed. She is not diaphoretic.     Comments: Well-appearing, comfortable, cooperative  HENT:  Head: Normocephalic and atraumatic.  Eyes:     General:        Right eye: No discharge.        Left eye: No discharge.     Conjunctiva/sclera: Conjunctivae normal.     Pupils: Pupils are equal, round, and reactive to light.  Neck:     Thyroid : No thyromegaly.  Cardiovascular:     Rate and Rhythm: Normal rate and regular rhythm.     Pulses: Normal pulses.     Heart sounds: Normal heart sounds. No murmur heard. Pulmonary:     Effort: Pulmonary effort is normal. No respiratory distress.     Breath sounds: Normal breath sounds. No wheezing or rales.  Abdominal:     General: Bowel sounds are normal. There is no distension.     Palpations: Abdomen is soft. There is no mass.     Tenderness: There is no abdominal tenderness.  Musculoskeletal:        General: No tenderness. Normal range of motion.     Cervical back: Normal range of motion and neck supple.     Comments: Upper / Lower Extremities: - Normal muscle tone, strength bilateral upper extremities 5/5, lower extremities 5/5  Lymphadenopathy:     Cervical: No cervical adenopathy.  Skin:    General: Skin is warm and dry.     Findings: No erythema or rash.  Neurological:     Mental Status: She is alert and oriented to person, place, and time.     Comments: Distal sensation intact to light touch all extremities  Psychiatric:        Mood and Affect: Mood normal.        Behavior: Behavior normal.        Thought Content: Thought content normal.     Comments: Well groomed, good eye contact, normal speech and thoughts     Results for orders placed or performed in visit on 03/05/24  VITAMIN D  25 Hydroxy (Vit-D Deficiency, Fractures)   Collection Time: 03/20/24  9:38 AM  Result Value Ref Range   Vit D, 25-Hydroxy 46 30 - 100 ng/mL  Vitamin B12   Collection Time: 03/20/24  9:38 AM  Result Value Ref Range    Vitamin B-12 444 200 - 1,100 pg/mL  Comprehensive metabolic panel with GFR   Collection Time: 03/20/24  9:38 AM  Result Value Ref Range   Glucose, Bld 72 65 - 99 mg/dL   BUN 30 (H) 7 - 25 mg/dL   Creat 9.22 9.39 - 8.99 mg/dL   eGFR 83 > OR = 60 fO/fpw/8.26f7   BUN/Creatinine Ratio 39 (H) 6 - 22 (calc)   Sodium 141 135 - 146 mmol/L   Potassium 4.1 3.5 - 5.3 mmol/L   Chloride 106 98 - 110 mmol/L   CO2 22 20 - 32 mmol/L   Calcium  9.6 8.6 - 10.4 mg/dL   Total Protein 7.2 6.1 - 8.1 g/dL   Albumin 4.7 3.6 - 5.1 g/dL   Globulin 2.5 1.9 - 3.7 g/dL (calc)   AG Ratio 1.9 1.0 - 2.5 (calc)   Total Bilirubin 0.5 0.2 - 1.2 mg/dL   Alkaline phosphatase (APISO) 118 37 - 153 U/L   AST 39 (H) 10 - 35 U/L   ALT 52 (H) 6 - 29 U/L  CBC with Differential/Platelet   Collection Time: 03/20/24  9:38 AM  Result Value Ref Range   WBC 5.6 3.8 - 10.8 Thousand/uL   RBC 4.17 3.80 - 5.10 Million/uL  Hemoglobin 12.2 11.7 - 15.5 g/dL   HCT 61.4 64.0 - 53.9 %   MCV 92.3 81.4 - 101.7 fL   MCH 29.3 27.0 - 33.0 pg   MCHC 31.7 31.6 - 35.4 g/dL   RDW 86.3 88.9 - 84.9 %   Platelets 284 140 - 400 Thousand/uL   MPV 11.3 7.5 - 12.5 fL   Neutro Abs 3,321 1,500 - 7,800 cells/uL   Absolute Lymphocytes 1,445 850 - 3,900 cells/uL   Absolute Monocytes 437 200 - 950 cells/uL   Eosinophils Absolute 370 15 - 500 cells/uL   Basophils Absolute 28 0 - 200 cells/uL   Neutrophils Relative % 59.3 %   Total Lymphocyte 25.8 %   Monocytes Relative 7.8 %   Eosinophils Relative 6.6 %   Basophils Relative 0.5 %  Hemoglobin A1c   Collection Time: 03/20/24  9:38 AM  Result Value Ref Range   Hgb A1c MFr Bld 5.4 <5.7 %   Mean Plasma Glucose 108 mg/dL   eAG (mmol/L) 6.0 mmol/L  TSH   Collection Time: 03/20/24  9:38 AM  Result Value Ref Range   TSH 1.29 0.40 - 4.50 mIU/L      Assessment & Plan:   Problem List Items Addressed This Visit     CHF (congestive heart failure), NYHA class I, chronic, diastolic (HCC)   Relevant  Medications   atorvastatin  (LIPITOR ) 80 MG tablet   amLODipine  (NORVASC ) 2.5 MG tablet   Chronic anticoagulation   Essential hypertension   Relevant Medications   atorvastatin  (LIPITOR ) 80 MG tablet   amLODipine  (NORVASC ) 2.5 MG tablet   Hemiparesis affecting right side as late effect of cerebrovascular accident (HCC)   History of cerebrovascular accident (CVA) with residual deficit   Mixed stress and urge urinary incontinence   OSA on CPAP   Persistent atrial fibrillation (HCC)   Relevant Medications   atorvastatin  (LIPITOR ) 80 MG tablet   amLODipine  (NORVASC ) 2.5 MG tablet   Pure hypercholesterolemia   Relevant Medications   atorvastatin  (LIPITOR ) 80 MG tablet   amLODipine  (NORVASC ) 2.5 MG tablet   Other Visit Diagnoses       Annual physical exam    -  Primary     History of cardioversion            Updated Health Maintenance information Reviewed recent lab results with patient Encouraged improvement to lifestyle with diet and exercise Goal of weight loss  Adult Wellness Visit Annual wellness visit conducted. Blood work reviewed, most results normal.  - Schedule mammogram and bone density scan after insurance change. - await her to contact us  for scheduling / ordering.  Chronic diastolic heart failure, NYHA class I Chronic diastolic heart failure with normal systolic function. Recent Echocardiogram shows normal ejection fraction but grade 2 diastolic dysfunction. Explained commonality and asymptomatic nature unless fluid overload occurs. - Monitor for symptoms of fluid overload such as swelling, fatigue, and shortness of breath. - Educated on the nature of diastolic heart failure and its implications.  Essential hypertension Blood pressure consistently above target. Discussed potential increase in amlodipine  dosage and side effects. Encouraged home monitoring. - Increase amlodipine  dosage from 2.5 up to 5 mg now as concern blood pressure remains elevated. - Monitor  blood pressure at home and report readings. - Encouraged resistance training with caution to avoid sustained elevated heart rate.  Persistent atrial fibrillation Recent cardioversion, currently in sinus rhythm. Awaiting TEE results.  [facility], has upcoming TEE - Had recent cardioversion successful - On Flecainide   -  Thought watchman device caused stroke - Continue monitoring heart rhythm and symptoms.  History of cerebrovascular accident with residual deficit Cerebrovascular accident with residual deficit.  Followed by Neurology / Cardiology Recent updates suggest the source of stroke was from the [facility] Device in heart. On Anticoagulation with Xarelto  15mg  daily at this time, no longer DAPT Discussed statins' role in stroke prevention and potential switch to Repatha. Emphasized cholesterol control importance. - Consider switching from atorvastatin  to Repatha after insurance change. - Continue monitoring for any new neurological symptoms.  Pure hypercholesterolemia Hypercholesterolemia managed with atorvastatin . Discussed potential switch to Repatha and its benefits. Note mild elevated LFT elevation question Statin, however this is usually not an actual concern or harmful - Consider switching from atorvastatin  to Repatha after insurance change. - Monitor cholesterol levels and symptoms. - Await advanced lipid panel results.  Advised she may reduce Statin from 80mg  down to 40mg  due to preference   Elevated LFT Lab results AST ALT 39 / 52 Consider repeat LFT in future for trend  Mixed stress and urge urinary incontinence Managed with posterior tibial nerve stimulation (PTNS). Discussed PTNS effectiveness. - Continue PTNS therapy.      FMLA updates  Continue FMLA for daughter as primary caregiver to remain on leave as planned.  see prior notes for dates, up to 3 months. She is needed to provide caregiver support to her mother with tasks of ADLs and iADLs, she is able  to assist with medication administration, food preparation, daily hygiene, chores, cleaning, shopping, transportation to medical appointments and assisting her mother with managing her current significant illness.  Future 2026, consider switching Statin to Repatha, consider coverage     No orders of the defined types were placed in this encounter.     Follow up plan: Return in about 22 days (around 04/16/2024) for follow-up 3-4 weeks Updates, Cardio, Neuro, BP / Cholesterol.  Marsa Officer, DO [facility] 03/25/2024, 8:14 AM  "

## 2024-03-30 LAB — CARDIO IQ ADV LIPID AND INFLAMM PNL
Apolipoprotein B: 62 mg/dL
Cholesterol: 159 mg/dL
HDL LARGE: 5455 nmol/L — ABNORMAL LOW
HDL: 70 mg/dL
LDL Cholesterol (Calc): 75 mg/dL
LDL Medium: 197 nmol/L
LDL Particle Number: 1038 nmol/L
LDL Peak Size: 220.5 Angstrom — ABNORMAL LOW
LDL Small: 170 nmol/L — ABNORMAL HIGH
Lipoprotein (a): <10 nmol/L
Non-HDL Cholesterol (Calc): 89 mg/dL
PLAC: 68 nmol/min/mL
Total CHOL/HDL Ratio: 2.3 calc
Triglycerides: 65 mg/dL
hs-CRP: 0.8 mg/L

## 2024-03-30 LAB — COMPREHENSIVE METABOLIC PANEL WITH GFR
AG Ratio: 1.9 (calc) (ref 1.0–2.5)
ALT: 52 U/L — ABNORMAL HIGH (ref 6–29)
AST: 39 U/L — ABNORMAL HIGH (ref 10–35)
Albumin: 4.7 g/dL (ref 3.6–5.1)
Alkaline phosphatase (APISO): 118 U/L (ref 37–153)
BUN/Creatinine Ratio: 39 (calc) — ABNORMAL HIGH (ref 6–22)
BUN: 30 mg/dL — ABNORMAL HIGH (ref 7–25)
CO2: 22 mmol/L (ref 20–32)
Calcium: 9.6 mg/dL (ref 8.6–10.4)
Chloride: 106 mmol/L (ref 98–110)
Creat: 0.77 mg/dL (ref 0.60–1.00)
Globulin: 2.5 g/dL (ref 1.9–3.7)
Glucose, Bld: 72 mg/dL (ref 65–99)
Potassium: 4.1 mmol/L (ref 3.5–5.3)
Sodium: 141 mmol/L (ref 135–146)
Total Bilirubin: 0.5 mg/dL (ref 0.2–1.2)
Total Protein: 7.2 g/dL (ref 6.1–8.1)
eGFR: 83 mL/min/1.73m2

## 2024-03-30 LAB — CBC WITH DIFFERENTIAL/PLATELET
Absolute Lymphocytes: 1445 {cells}/uL (ref 850–3900)
Absolute Monocytes: 437 {cells}/uL (ref 200–950)
Basophils Absolute: 28 {cells}/uL (ref 0–200)
Basophils Relative: 0.5 %
Eosinophils Absolute: 370 {cells}/uL (ref 15–500)
Eosinophils Relative: 6.6 %
HCT: 38.5 % (ref 35.9–46.0)
Hemoglobin: 12.2 g/dL (ref 11.7–15.5)
MCH: 29.3 pg (ref 27.0–33.0)
MCHC: 31.7 g/dL (ref 31.6–35.4)
MCV: 92.3 fL (ref 81.4–101.7)
MPV: 11.3 fL (ref 7.5–12.5)
Monocytes Relative: 7.8 %
Neutro Abs: 3321 {cells}/uL (ref 1500–7800)
Neutrophils Relative %: 59.3 %
Platelets: 284 Thousand/uL (ref 140–400)
RBC: 4.17 Million/uL (ref 3.80–5.10)
RDW: 13.6 % (ref 11.0–15.0)
Total Lymphocyte: 25.8 %
WBC: 5.6 Thousand/uL (ref 3.8–10.8)

## 2024-03-30 LAB — HEMOGLOBIN A1C
Hgb A1c MFr Bld: 5.4 %
Mean Plasma Glucose: 108 mg/dL
eAG (mmol/L): 6 mmol/L

## 2024-03-30 LAB — TSH: TSH: 1.29 m[IU]/L (ref 0.40–4.50)

## 2024-03-30 LAB — VITAMIN D 25 HYDROXY (VIT D DEFICIENCY, FRACTURES): Vit D, 25-Hydroxy: 46 ng/mL (ref 30–100)

## 2024-03-30 LAB — VITAMIN B12: Vitamin B-12: 444 pg/mL (ref 200–1100)

## 2024-04-07 NOTE — Telephone Encounter (Signed)
 Letter of ineligibility from [employer] for Xarelto  does not meet the minimum financial criteria. Letter in media.

## 2024-04-17 ENCOUNTER — Ambulatory Visit: Admitting: Family Medicine

## 2024-04-18 ENCOUNTER — Other Ambulatory Visit (INDEPENDENT_AMBULATORY_CARE_PROVIDER_SITE_OTHER): Admitting: Pharmacist

## 2024-04-18 DIAGNOSIS — I6930 Unspecified sequelae of cerebral infarction: Secondary | ICD-10-CM

## 2024-04-18 DIAGNOSIS — I10 Essential (primary) hypertension: Secondary | ICD-10-CM

## 2024-04-18 DIAGNOSIS — I4819 Other persistent atrial fibrillation: Secondary | ICD-10-CM

## 2024-04-18 NOTE — Patient Instructions (Signed)
 Check your blood pressure at least twice weekly, and any time you have concerning symptoms like headache, chest pain, dizziness, shortness of breath, or vision changes.    To appropriately check your blood pressure, make sure you do the following:  1) Avoid caffeine, exercise, or tobacco products for 30 minutes before checking. Empty your bladder. 2) Sit with your back supported in a flat-backed chair. Rest your arm on something flat (arm of the chair, table, etc). 3) Sit still with your feet flat on the floor, resting, for at least 5 minutes.  4) Check your blood pressure. Take 1-2 readings.  5) Write down these readings and bring with you to any provider appointments.  Bring your home blood pressure machine with you to a provider's office for accuracy comparison at least once a year.   Make sure you take your blood pressure medications before you come to any office visit, even if you were asked to fast for labs.  Sharyle Sia, PharmD, [facility] Clinical Pharmacist [facility] [phone]

## 2024-04-18 NOTE — Progress Notes (Signed)
 "  04/18/2024 Name: Rachel Whitehead MRN: [id number] DOB: 09/14/1955  Chief Complaint  Patient presents with   Medication Management    Rachel Whitehead is a 69 y.o. year old female who presented for a telephone visit.   They were referred to the pharmacist by their PCP for assistance in managing medication access.      Subjective:   Care Team: Primary Care Provider: Edman Marsa PARAS, DO ; Next Scheduled Visit: 04/23/2024 Cardiologist: Dewane Shiner, DO; Next Scheduled Visit: Today Cardiac Electrophysiology: Daubert, Lynwood Dover, MD; Next Scheduled Visit: 05/21/2024 Neurologist: Rosemarie Eather RAMAN, MD Urologist: Manuelita Harlene Houston, NP  Medication Access/Adherence  Current Pharmacy:  [id number] - [place], [place] - [address] [place] [address] Phone: [phone] Fax: [phone]  [facility] [address] [place] [address] Phone: [phone] Fax: [phone]   Patient reports affordability concerns with their medications: Yes  Patient reports access/transportation concerns to their pharmacy: No  Patient reports adherence concerns with their medications:  No     Reports has changed Medicare plans to [facility] Gold Plus for 2026 calendar year   Atrial Fibrillation s/p Watchman Device (placed 06/2023)/ Stroke Prevention:   Patient followed by [facility]    Current medications: - Rhythm Control: flecainide  100 mg twice daily  - Anticoagulation Regimen: Xarelto  20 mg daily with dinner  - amlodipine  2.5 mg - 2 tablets (5 mg) daily - confirms increased as directed by PCP on 03/25/2024  Medications tried in the past: Eliquis      Uses upper arm monitor; last checked yesterday, recalls reading: ~130/80   Reports feels dizzy occasionally with positional changes, but also at other times, but not noticing low BP/HR. Plans to discuss with Cardiologist at appointment  today   Current Medication Access Support: none. Note previously tried to apply for Xarelto  patient assistance from manufacturer, but application was denied based on financial criteria   Hyperlipidemia/ASCVD Risk Reduction  Current lipid lowering medications: atorvastatin  80 mg- 1/2 tablet (40 mg) daily  Atorvastatin  decreased to current dose on 03/25/2024 due to concern for brain fog - feels like side effect improved with dose decrease)  ASCVD History: history of stroke    Objective:   Lab Results  Component Value Date   CREATININE 0.77 03/20/2024   BUN 30 (H) 03/20/2024   NA 141 03/20/2024   K 4.1 03/20/2024   CL 106 03/20/2024   CO2 22 03/20/2024    Lab Results  Component Value Date   CHOL 159 03/20/2024   HDL 70 03/20/2024   LDLCALC 75 03/20/2024   TRIG 65 03/20/2024   CHOLHDL 2.3 03/20/2024   BP Readings from Last 3 Encounters:  03/25/24 (!) 140/82  03/19/24 (!) 160/89  02/20/24 (!) 149/80   Pulse Readings from Last 3 Encounters:  03/25/24 (!) 58  03/19/24 63  02/20/24 (!) 49     Medications Reviewed Today     Reviewed by Alana Sharyle LABOR, RPH-CPP (Pharmacist) on 04/18/24 at 1604  Med List Status: <None>   Medication Order Taking? Sig Documenting Provider Last Dose Status Informant  amLODipine  (NORVASC ) 2.5 MG tablet [id number] Yes Take 2 tablets (5 mg total) by mouth daily. Edman Marsa PARAS, DO  Active   atorvastatin  (LIPITOR ) 80 MG tablet [id number]  Take 0.5 tablets (40 mg total) by mouth daily. Edman Marsa PARAS, DO  Active   flecainide  (TAMBOCOR ) 100 MG tablet [id number]  Take 0.5 tablets (50  mg total) by mouth 2 (two) times daily. Reduced from 100 mg.  Patient taking differently: Take 100 mg by mouth 2 (two) times daily. Reduced from 100 mg.   Awanda [place], MD  Active   Multiple Vitamin (MULTIVITAMIN) tablet [id number]  Take 1 tablet by mouth daily. [provider]  Active Self    Discontinued 04/18/24 0844   rivaroxaban   (XARELTO ) 20 MG TABS tablet [id number] Yes Take 20 mg by mouth daily with supper. [provider]  Active               Assessment/Plan:   Review with patient that per [facility] website, the [employer] Gold Plus plan has a $250 annual prescription deductible (impacting tiers 3-5) and tier 3 copayment of $47 (for medications such as Xarelto ) in 2026. Note out of pocket prescription drug cost in 2026 is capped at $2,100.   Atrial Fibrillation: - Reviewed importance of adherence to anticoagulant for stroke prevention. Also discuss importance of continuing to monitor for any signs of bleeding or bruising while on anticoagulant. - Reviewed appropriate blood pressure monitoring technique and reviewed goal blood pressure. - Recommend to monitor home blood pressure, keep log of results and have this record to review at upcoming medical appointments. Patient to contact provider office sooner if needed for readings outside of established parameters or symptoms  Hyperlipidemia/ASCVD Risk Reduction/History of Stroke: - Currently uncontrolled.  - Reviewed [place] term complications of uncontrolled cholesterol - Discuss rational for LDL control for ASCVD risk reduction. Counsel on options for additional therapy, such as PCSK9 inhibitors, as previously discussed with PCP Patient plans to discuss further today at appointment with Cardiologist and will let pharmacist know if she is interested in help with obtaining approval for/starting additional therapy    Follow Up Plan: Clinical Pharmacist will follow up with patient by telephone on 07/14/2024 at 8:30 AM    Sharyle Sia, PharmD, JAQUELINE, CPP Clinical Pharmacist [facility] [phone]   "

## 2024-04-23 ENCOUNTER — Encounter: Payer: Self-pay | Admitting: Family Medicine

## 2024-04-23 ENCOUNTER — Ambulatory Visit: Admitting: Family Medicine

## 2024-04-23 VITALS — BP 130/78 | HR 67 | Ht 64.0 in | Wt 181.2 lb

## 2024-04-23 DIAGNOSIS — I69351 Hemiplegia and hemiparesis following cerebral infarction affecting right dominant side: Secondary | ICD-10-CM | POA: Diagnosis not present

## 2024-04-23 DIAGNOSIS — I5032 Chronic diastolic (congestive) heart failure: Secondary | ICD-10-CM | POA: Diagnosis not present

## 2024-04-23 DIAGNOSIS — E7800 Pure hypercholesterolemia, unspecified: Secondary | ICD-10-CM

## 2024-04-23 DIAGNOSIS — I6930 Unspecified sequelae of cerebral infarction: Secondary | ICD-10-CM | POA: Diagnosis not present

## 2024-04-23 DIAGNOSIS — I10 Essential (primary) hypertension: Secondary | ICD-10-CM | POA: Diagnosis not present

## 2024-04-23 DIAGNOSIS — Z7901 Long term (current) use of anticoagulants: Secondary | ICD-10-CM | POA: Diagnosis not present

## 2024-04-23 DIAGNOSIS — I4819 Other persistent atrial fibrillation: Secondary | ICD-10-CM | POA: Diagnosis not present

## 2024-04-23 MED ORDER — AMLODIPINE BESYLATE 2.5 MG PO TABS: 2.5000 mg | ORAL_TABLET | Freq: Two times a day (BID) | ORAL | 1 refills | Status: AC

## 2024-04-23 MED ORDER — ATORVASTATIN CALCIUM 20 MG PO TABS: 20.0000 mg | ORAL_TABLET | Freq: Every day | ORAL | 1 refills | Status: AC

## 2024-04-23 NOTE — Patient Instructions (Addendum)
 Thank you for coming to the office today.  New rx Atorvastatin  20mg  daily, down from 40mg   Keep Amlodipine  2.5mg  twice a day now, and we can refill the 2.5mg  tablet.  The last lipid panel was from December, LDL had improved, we need to check it again on the lower dose.  Please schedule a Follow-up Appointment to: Return in about 2 months (around 06/21/2024) for 2 month fasting lab only Lipid + CMET.  If you have any other questions or concerns, please feel free to call the office or send a message through MyChart. You may also schedule an earlier appointment if necessary.  Additionally, you may be receiving a survey about your experience at our office within a few days to 1 week by e-mail or mail. We value your feedback.  Marsa Officer, DO [facility], [place]

## 2024-04-23 NOTE — Progress Notes (Unsigned)
 "  Subjective:    Patient ID: Tery Law, female    DOB: 1955/11/24, 69 y.o.   MRN: [id number]  Layali Freund is a 69 y.o. female presenting on 04/23/2024 for Medical Management of Chronic Issues   HPI  Discussed the use of AI scribe software for clinical note transcription with the patient, who gave verbal consent to proceed.  History of Present Illness   Farra Nikolic is a 69 year old female with atrial fibrillation and a Watchman device who presents for follow-up on her cardiac status and medication management. She is accompanied by her daughter, Therisa.  Atrial fibrillation and watchman device - History of atrial fibrillation managed with a Watchman device leak - Transesophageal echocardiogram (TEE) on April 17, 2024, showed a well-seated Watchman device with a mild leak in the upper shoulder measuring 3 millimeters, unchanged from previous TEE in November. - The leak was discovered post-surgery and has remained stable. - Scheduled to see an electrophysiologist on May 21, 2024, for further evaluation. - Continues on flecainide  and Xarelto  for atrial fibrillation management. - Xarelto  prescription is expensive; denied assistance for cost reduction but has a new health insurance plan that may offer better coverage.  Hypertension - Currently taking amlodipine  5 mg daily for blood pressure management. (2.5mg  x 2) - Desires to reduce amlodipine  dose to 2.5 mg daily she prefers less medication - Home blood pressure readings previously up to 140 mmHg. - Experiences swelling in feet and ankles but does not attribute it to amlodipine .  Hyperlipidemia - Currently taking atorvastatin , dose reduced from 80 mg to 40 mg. - Desires to further reduce atorvastatin  to 20 mg, pending cholesterol check. - On 80mg  dose, LDL cholesterol was 75 mg/dL in December 2025, improved from 133 mg/dL a year prior. - Cautious with diet and uses natural methods to manage cholesterol.  Musculoskeletal pain -  Experiences knee pain, suspects possible association with statin use. - Aware that statins typically cause muscle aches rather than joint pain.       CHF Diastolic Grade 2 On ECHO previously diagnosed. asymptomatic   Cerebrovascular accident sequelae / Left Cerebellar ischemic stroke - Left cerebellar stroke occurred on January 24, 2024, presenting with sudden right leg weakness and complete loss of function initially - Gradual return of some right leg strength, but persistent difference in sensation and function compared to baseline - Difficulty with prolonged standing and wearing shoes due to right leg symptoms - She also has some residual brain fog, dizziness, lightheadedness, balance problems. Followed by Neurology and Cardiology currently       04/23/2024    1:12 PM 03/25/2024    8:09 AM 09/04/2023    8:32 AM  Depression screen PHQ 2/9  Decreased Interest 0 0 0  Down, Depressed, Hopeless 0 0 0  PHQ - 2 Score 0 0 0  Altered sleeping 1  2  Tired, decreased energy 1  1  Change in appetite 0  0  Feeling bad or failure about yourself  0  0  Trouble concentrating 1  1  Moving slowly or fidgety/restless 0  0  Suicidal thoughts 0  0  PHQ-9 Score 3  4   Difficult doing work/chores Somewhat difficult  Somewhat difficult     Data saved with a previous flowsheet row definition       04/23/2024    1:12 PM 03/25/2024    8:09 AM 09/04/2023    8:32 AM 06/04/2023   11:31 AM  GAD 7 : Generalized Anxiety  Score  Nervous, Anxious, on Edge 0 0  0  0   Control/stop worrying 0 0  0  0   Worry too much - different things 0 0  0  0   Trouble relaxing 0 0  1  0   Restless 0 0  0  0   Easily annoyed or irritable 0 0  0  0   Afraid - awful might happen 0 0  0  0   Total GAD 7 Score 0 0 1 0  Anxiety Difficulty   Somewhat difficult Not difficult at all     Data saved with a previous flowsheet row definition    Social History[1]  Review of Systems Per HPI unless specifically indicated  above     Objective:    BP 130/78 (BP Location: Left Arm, Patient Position: Sitting, Cuff Size: Normal)   Pulse 67   Ht 5' 4 (1.626 m)   Wt 181 lb 4 oz (82.2 kg)   SpO2 99%   BMI 31.11 kg/m   Wt Readings from Last 3 Encounters:  04/23/24 181 lb 4 oz (82.2 kg)  03/25/24 180 lb 6 oz (81.8 kg)  03/19/24 175 lb (79.4 kg)    Physical Exam Vitals and nursing note reviewed.  Constitutional:      General: She is not in acute distress.    Appearance: She is well-developed. She is not diaphoretic.     Comments: Well-appearing, comfortable, cooperative  HENT:     Head: Normocephalic and atraumatic.  Eyes:     General:        Right eye: No discharge.        Left eye: No discharge.     Conjunctiva/sclera: Conjunctivae normal.     Pupils: Pupils are equal, round, and reactive to light.  Neck:     Thyroid : No thyromegaly.  Cardiovascular:     Rate and Rhythm: Normal rate. Rhythm irregular.     Pulses: Normal pulses.     Heart sounds: Normal heart sounds. No murmur heard. Pulmonary:     Effort: Pulmonary effort is normal. No respiratory distress.     Breath sounds: Normal breath sounds. No wheezing or rales.  Abdominal:     General: Bowel sounds are normal. There is no distension.     Palpations: Abdomen is soft. There is no mass.     Tenderness: There is no abdominal tenderness.  Musculoskeletal:        General: No tenderness. Normal range of motion.     Cervical back: Normal range of motion and neck supple.     Comments: Upper / Lower Extremities: - Normal muscle tone, strength bilateral upper extremities 5/5, lower extremities 5/5  Lymphadenopathy:     Cervical: No cervical adenopathy.  Skin:    General: Skin is warm and dry.     Findings: No erythema or rash.  Neurological:     Mental Status: She is alert and oriented to person, place, and time.     Comments: Distal sensation intact to light touch all extremities  Psychiatric:        Mood and Affect: Mood normal.         Behavior: Behavior normal.        Thought Content: Thought content normal.     Comments: Well groomed, good eye contact, normal speech and thoughts     I have personally reviewed the radiology report from 04/18/24 on TEE.  Echo TEE  Anatomical Region Laterality Modality  -- --  Other   Narrative  This result has an attachment that is not available.               [facility]                          [place]                                                                                        KNOX HOLDMAN                           [facility]                               [id number]                                                                                 DOB: 01-03-1956  Age: 22                   TEE REPORT                                       Date: 04/17/2024                                                                                  Female                                                                                      Outpatient                                                                        LOCATION: [facility]  MD1: JAMES PATRICK DAUBERT        STUDY: ECHO TEE                                   SOUND QLTY: Moderate                       ECHO: Yes                                            STRAIN: No                            COLOR: Yes                                                3D: No                          DOPPLER: Yes                                                BP: 154 / 95                  RV BIOPSY: No                                                 HR: 62 BPM                     CONTRAST: No                                             Height: 64 in                       MEDIUM: N/A                                            Weight: 176 lbs                    MACHINE: CDU - EPIQ-4                                      BSA: 1.9                     ------------------------------------------------------------------------------------------    History: s/p Watchman Device     Reason: Assess Cardiac Device     Indication: I48.19- Other persistent atrial fibrillation (CM.  CONCLUSION ------------------------------------------------------------------------------- 1. NORMAL BIVENTRICULAR SYSTOLIC FUNCTION.                                               2. WELL SEATED WATCHMAN DEVICE WITH MILD LEAK IN THE UPPER SHOULDER MEASURING 0.3 CM. NO THROMBUS SEEN (NO SIGNIFICANT CAHNGES)                                                   3. MILD MR, TRIVIAL TR                                                                                                                                                                                                                                                      ANESTHESIA -------------------------------------------------------------------------------               Midazolam : Yes                    Dose: 4 mg                                                        Fentanyl : Yes                    Dose: 100 mcg                                                    Propofol : No                        Ketamine: No                      Intubation: Esophageal intubation without difficulty.  Complications: None                     None                                            CONSENT ---------------------------------------------------------------------------------- The attending Diego Yuvonne CHRISTELLA Velinda, M.D. and Lillian Fryer, M.D. were present and   participated throughout the entire procedure. Informed consent was obtained from the     patient and a timeout was performed prior to the procedure.                                PROCEDURE -------------------------------------------------------------------------------- MITRAL VALVE             Leaflets: Normal                                  Mobility: Fully Mobile                          Mass: No Masses                            Doppler-MR: TRIVIAL MR                                    MS: MS Not Assessed          AORTIC VALVE             AoCusps: Tricuspid                               Mobility: Fully Mobile                    Morphology: Normal                                                                                  Mass: No Masses                           Doppler- AR: TRIVIAL AR                                    AS: AS Not Assessed          AORTA         Asc Ao Size: DILATED                              Dissection: Not Assessed  TRICUSPID VALVE            Leaflets: Not Assessed                            Mobility: Not Assessed                    Morphology: Not Assessed                                Mass: Not Assessed                          Doppler-TR: TR Not Assessed                               TS: TS Not Assessed          PULMONIC VALVE            Leaflets: Not Assessed                            Mobility: Not Assessed                    Morphology: Not Assessed                                                    Mass: Not Assessed                          Doppler-PR: PR Not Assessed                               PS: PS Not Assessed          MAIN PA                Size: Not Assessed                  LEFT ATRIUM / APPENDAGE                Size: Not Assessed                                                                            Mass: Not Assessed                        LAA Thrombus: Absent                               LAA Closure: PARTIAL                           Closure Method: INTRACARDIAC DEVICE  LAA Leak: LEAK NOTED                                                                    Appendage Note: WATCHMAN  WITH LEAK IN THE SUPERIOR SHOULDER MEASURES 0.3CM                 RIGHT ATRIUM                Size: Not Assessed                                Mass: Not Assessed                  LEFT VENTRICLE                Size: Normal                                                                                   LVH: MILD LVH CONCENTRIC               Contraction: Normal                            Closest EF: >55%                                                                                 LV Mass: Not Assessed                       Dias. FxClass: Not Assessed                                                              RIGHT VENTRICLE                Size: Normal                                 Free Wall: Normal                         Contraction: Normal  RV masses: No Masses                    PERICARDIUM               Fluid: Not Assessed                                        (Report version 1.0)                                                                        Perform By: Lillian Fryer, M.D.                                                     Res. Person: Alfonso Davenport, RDCS, RVT                                                 Resp.Fellow: Lillian Fryer, M.D.                                                     Resp. Staff: Leonor Cousin, RN                                              Electronically signed by Diego Yuvonne CHRISTELLA Velinda, M.D. on:04/17/2024 3:18:53 PM with status of Final  The images are stored in the [facility] system, please contact the clinical provider for images related to this study.                                                                     Procedure Note  Yuvonne CHRISTELLA Velinda Diego, MD - 04/17/2024 Formatting of this note might be different from the original.               [facility]                          [place]   [facility]                           [facility]  [id number]                                                                                  DOB: Mar 15, 1956  Age: 67                   TEE REPORT                                       Date: 04/17/2024             Female                 Outpatient                                                                        LOCATION: [facility]                                                                             MD1: LYNWOOD PATRICK DAUBERT        STUDY: ECHO TEE                                   SOUND QLTY: Moderate                       ECHO: Yes                                            STRAIN: No            COLOR: Yes                                                3D: No          DOPPLER: Yes                                                BP: 154 / 95                  RV BIOPSY: No  HR: 62 BPM                     CONTRAST: No                                             Height: 64 in       MEDIUM: N/A                                            Weight: 176 lbs                    MACHINE: CDU - EPIQ-4                                      BSA: 1.9    ------------------------------------------------------------------------------------------    History: s/p Watchman Device     Reason: Assess Cardiac Device     Indication: I48.19- Other persistent atrial fibrillation (CM.                            CONCLUSION ------------------------------------------------------------------------------- 1. NORMAL BIVENTRICULAR SYSTOLIC FUNCTION.                                 2. WELL SEATED WATCHMAN DEVICE WITH MILD LEAK IN THE UPPER SHOULDER MEASURING 0.3 CM. NO THROMBUS SEEN (NO SIGNIFICANT CAHNGES)                                     3. MILD MR, TRIVIAL TR                                                        ANESTHESIA -------------------------------------------------------------------------------               Midazolam : Yes                    Dose: 4 mg                                Fentanyl :  Yes                    Dose: 100 mcg                              Propofol : No                        Ketamine: No                      Intubation: Esophageal intubation without difficulty.                   Complications: None  None                      CONSENT ---------------------------------------------------------------------------------- The attending Diego Yuvonne CHRISTELLA Velinda, M.D. and Lillian Fryer, M.D. were present and   participated throughout the entire procedure. Informed consent was obtained from the     patient and a timeout was performed prior to the procedure.                  PROCEDURE -------------------------------------------------------------------------------- MITRAL VALVE            Leaflets: Normal                                  Mobility: Fully Mobile                          Mass: No Masses                            Doppler-MR: TRIVIAL MR                                    MS: MS Not Assessed          AORTIC VALVE             AoCusps: Tricuspid                               Mobility: Fully Mobile                    Morphology: Normal                                                                Mass: No Masses                           Doppler- AR: TRIVIAL AR                                    AS: AS Not Assessed          AORTA         Asc Ao Size: DILATED                              Dissection: Not Assessed                                            TRICUSPID VALVE            Leaflets: Not Assessed                            Mobility: Not Assessed  Morphology: Not Assessed                                Mass: Not Assessed                          Doppler-TR: TR Not Assessed                               TS: TS Not Assessed          PULMONIC VALVE            Leaflets: Not Assessed                            Mobility: Not Assessed                    Morphology: Not Assessed                                                     Mass: Not Assessed                          Doppler-PR: PR Not Assessed                               PS: PS Not Assessed          MAIN PA                Size: Not Assessed                  LEFT ATRIUM / APPENDAGE                Size: Not Assessed                                                          Mass: Not Assessed                        LAA Thrombus: Absent                               LAA Closure: PARTIAL                           Closure Method: INTRACARDIAC DEVICE                LAA Leak: LEAK NOTED                                                  Appendage Note: WATCHMAN  WITH LEAK IN THE SUPERIOR SHOULDER MEASURES 0.3CM  RIGHT ATRIUM                Size: Not Assessed                                Mass: Not Assessed                  LEFT VENTRICLE                Size: Normal                                                                LVH: MILD LVH CONCENTRIC               Contraction: Normal                            Closest EF: >55%                                                               LV Mass: Not Assessed                      Dias. FxClass: Not Assessed                                            RIGHT VENTRICLE                Size: Normal                                 Free Wall: Normal                         Contraction: Normal                                                     RV masses: No Masses                    PERICARDIUM               Fluid: Not Assessed                                        (Report version 1.0)  Perform By: Lillian Fryer, M.D.                                           Res. Person: Alfonso Davenport, RDCS, RVT                                     Resp.Fellow: Lillian Fryer, M.D.                                           Resp. Staff: Leonor Cousin, RN                                  Electronically signed by Diego Yuvonne CHRISTELLA Velinda, M.D. on:04/17/2024 3:18:53 PM with status of Final  The images are stored in the [facility] system, please contact the clinical provider for images related to this study.                                                     All Measurements  Exam End: 04/17/24 11:24   Specimen Collected: 04/17/24 09:25 Last Resulted: 04/17/24 15:18  Received From: [facility]  Result Received: 04/23/24 08:02    Results for orders placed or performed in visit on 03/05/24  Cardio IQ Adv Lipid and Inflamm Pnl   Collection Time: 03/20/24  9:38 AM  Result Value Ref Range   Cholesterol 159 <200 mg/dL   HDL 70 >50 mg/dL   Triglycerides 65 <849 mg/dL   LDL Cholesterol (Calc) 75 <899 mg/dL (calc)   Total CHOL/HDL Ratio 2.3 <5.0 calc   Non-HDL Cholesterol (Calc) 89 <869 mg/dL (calc)   LDL Particle Number 1,038 <8,861 nmol/L   LDL Small 170 (H) <142 nmol/L   LDL Medium 197 <215 nmol/L   HDL LARGE 5,455 (L) >6,729 nmol/L   LDL Pattern A A Pattern   LDL Peak Size 220.5 (L) >222.9 Angstrom   Apolipoprotein B 62 <90 mg/dL   Lipoprotein (a) <89 <24 nmol/L   hs-CRP 0.8 <1.0 mg/L   PLAC 68 <124 nmol/min/mL  VITAMIN D  25 Hydroxy (Vit-D Deficiency, Fractures)   Collection Time: 03/20/24  9:38 AM  Result Value Ref Range   Vit D, 25-Hydroxy 46 30 - 100 ng/mL  Vitamin B12   Collection Time: 03/20/24  9:38 AM  Result Value Ref Range   Vitamin B-12 444 200 - 1,100 pg/mL  Comprehensive metabolic panel with GFR   Collection Time: 03/20/24  9:38 AM  Result Value Ref Range   Glucose, Bld 72 65 - 99 mg/dL   BUN 30 (H) 7 - 25 mg/dL   Creat 9.22 9.39 - 8.99 mg/dL   eGFR 83 > OR = 60 fO/fpw/8.26f7   BUN/Creatinine Ratio 39 (H) 6 - 22 (calc)   Sodium 141 135 - 146 mmol/L   Potassium 4.1 3.5 - 5.3 mmol/L   Chloride 106 98 - 110 mmol/L   CO2 22 20 - 32 mmol/L   Calcium  9.6 8.6 - 10.4  mg/dL   Total Protein 7.2 6.1 - 8.1 g/dL   Albumin 4.7 3.6 - 5.1 g/dL   Globulin 2.5 1.9 - 3.7 g/dL (calc)   AG  Ratio 1.9 1.0 - 2.5 (calc)   Total Bilirubin 0.5 0.2 - 1.2 mg/dL   Alkaline phosphatase (APISO) 118 37 - 153 U/L   AST 39 (H) 10 - 35 U/L   ALT 52 (H) 6 - 29 U/L  CBC with Differential/Platelet   Collection Time: 03/20/24  9:38 AM  Result Value Ref Range   WBC 5.6 3.8 - 10.8 Thousand/uL   RBC 4.17 3.80 - 5.10 Million/uL   Hemoglobin 12.2 11.7 - 15.5 g/dL   HCT 61.4 64.0 - 53.9 %   MCV 92.3 81.4 - 101.7 fL   MCH 29.3 27.0 - 33.0 pg   MCHC 31.7 31.6 - 35.4 g/dL   RDW 86.3 88.9 - 84.9 %   Platelets 284 140 - 400 Thousand/uL   MPV 11.3 7.5 - 12.5 fL   Neutro Abs 3,321 1,500 - 7,800 cells/uL   Absolute Lymphocytes 1,445 850 - 3,900 cells/uL   Absolute Monocytes 437 200 - 950 cells/uL   Eosinophils Absolute 370 15 - 500 cells/uL   Basophils Absolute 28 0 - 200 cells/uL   Neutrophils Relative % 59.3 %   Total Lymphocyte 25.8 %   Monocytes Relative 7.8 %   Eosinophils Relative 6.6 %   Basophils Relative 0.5 %  Hemoglobin A1c   Collection Time: 03/20/24  9:38 AM  Result Value Ref Range   Hgb A1c MFr Bld 5.4 <5.7 %   Mean Plasma Glucose 108 mg/dL   eAG (mmol/L) 6.0 mmol/L  TSH   Collection Time: 03/20/24  9:38 AM  Result Value Ref Range   TSH 1.29 0.40 - 4.50 mIU/L      Assessment & Plan:   Problem List Items Addressed This Visit     CHF (congestive heart failure), NYHA class I, chronic, diastolic (HCC)   Relevant Medications   amLODipine  (NORVASC ) 2.5 MG tablet   atorvastatin  (LIPITOR ) 20 MG tablet   rivaroxaban  (XARELTO ) 20 MG TABS tablet   Chronic anticoagulation   Essential hypertension   Relevant Medications   amLODipine  (NORVASC ) 2.5 MG tablet   atorvastatin  (LIPITOR ) 20 MG tablet   rivaroxaban  (XARELTO ) 20 MG TABS tablet   Other Relevant Orders   Comprehensive metabolic panel with GFR   Hemiparesis affecting right side as late effect of cerebrovascular accident (HCC)   History of cerebrovascular accident (CVA) with residual deficit   Persistent atrial  fibrillation (HCC)   Relevant Medications   amLODipine  (NORVASC ) 2.5 MG tablet   atorvastatin  (LIPITOR ) 20 MG tablet   rivaroxaban  (XARELTO ) 20 MG TABS tablet   Other Relevant Orders   Comprehensive metabolic panel with GFR   Pure hypercholesterolemia - Primary   Relevant Medications   amLODipine  (NORVASC ) 2.5 MG tablet   atorvastatin  (LIPITOR ) 20 MG tablet   rivaroxaban  (XARELTO ) 20 MG TABS tablet   Other Relevant Orders   Comprehensive metabolic panel with GFR   Lipid panel    Persistent atrial fibrillation with Watchman device leak CHF Diastolic without complication Persistent atrial fibrillation with a well-seated Watchman device and a 3 mm leak. No thrombus observed. Leak unchanged from previous TEE 02/2024 to 04/2024 unchanged - Continue flecainide  and Xarelto . - Add refills to Xarelto . Reviewed cost w/ clinical pharmacist and patient has higher deductible but ultimately once patient reaches certain amount in payment cost will be reduced to affordable  range. She was denied for additional financial assistance application - Attend electrophysiologist appointment on February 18th with Dr. Melchor.  Essential hypertension Blood pressure well-controlled.  Hopeful for future dose adjustment - Agreed to adjust amlodipine  to 2.5 mg twice daily with plan to further reduce to 2.5mg  daily - Monitor blood pressure at home regularly. - Ordered new prescription for amlodipine  2.5 mg TWICE A DAY   Pure hypercholesterolemia LDL at 75 mg/dL on atorvastatin  80 mg. Dose has already been adjusted to half dose = 40mg  now currently no recent lipid results. She admits concerns with statin including brain fog and myalgia  Patient interested in dose reduction despite potential LDL increase. 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 - Ordered atorvastatin  20 mg daily. - agreed with  further dose reduction trial as we discussed the risks and benefits of dose adjustment on Statin, she understands that this will potentially reduce her cardiovascular prevention benefits from the Statin therapy. - Reviewed case with clinical pharmacist Sharyle Sia [facility] CPP, and we mutually agree along with patient that pursuit of Repatha PCSK9i would be beneficial to managing cholesterol and providing best ASCVD risk reduction in future if it is covered or affordable. She would prefer to switch off statin to PCSK9i - Scheduled fasting blood draw in two months to recheck cholesterol levels.  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 Stroke attributed to watchman device placement / leak - Continue current stroke prevention measures including anticoagulation therapy.      Orders Placed This Encounter  Procedures   Comprehensive metabolic panel with GFR    Standing Status:   Future    Expected Date:   06/25/2024    Expiration Date:   09/23/2024    Has the patient fasted?:   Yes   Lipid panel    Standing Status:   Future    Expected Date:   06/25/2024    Expiration Date:   09/23/2024    Has the patient fasted?:   Yes    Meds ordered this encounter  Medications   amLODipine  (NORVASC ) 2.5 MG tablet    Sig: Take 1 tablet (2.5 mg total) by mouth in the morning and at bedtime.    Dispense:  180 tablet    Refill:  1   atorvastatin  (LIPITOR ) 20 MG tablet    Sig: Take 1 tablet (20 mg total) by mouth at bedtime.    Dispense:  90 tablet    Refill:  1    Dose reduction 40 to 20mg    rivaroxaban  (XARELTO ) 20 MG TABS tablet    Sig: Take 1 tablet (20 mg total) by mouth daily with supper.    Dispense:  30 tablet    Refill:  5    Follow up plan: Return in about 2 months (around 06/21/2024) for 2 month fasting lab only Lipid + CMET.  Future labs ordered for 06/25/24 Lipid + CMET to check results of LDL  after Statin dose reduction    Marsa Officer, DO [facility] 04/23/2024, 1:25 PM     [1]  Social History Tobacco Use   Smoking status: Never  Vaping Use   Vaping status: Never Used  Substance Use Topics   Alcohol use: Never    Alcohol/week: 4.0 standard drinks of alcohol  Types: 4 Standard drinks or equivalent per week   Drug use: Never   "

## 2024-04-24 MED ORDER — RIVAROXABAN 20 MG PO TABS: 20.0000 mg | ORAL_TABLET | Freq: Every day | ORAL | 5 refills | Status: AC

## 2024-04-25 ENCOUNTER — Telehealth: Payer: Self-pay

## 2024-04-25 NOTE — Telephone Encounter (Signed)
 Copied from CRM #[id number]. Topic: General - Other >> Apr 25, 2024 10:44 AM Macario HERO wrote: Reason for CRM: Patient called said she needs a letter from her provider giving approval for her to work with a systems analyst at the [facility] in [place]. Patient is requesting we upload it to her MyChart.

## 2024-04-25 NOTE — Telephone Encounter (Signed)
 Letter written and sent to MyChart  Marsa Officer, DO [facility] 04/25/2024, 1:27 PM

## 2024-04-25 NOTE — Telephone Encounter (Signed)
Patient notified letter is in her my chart.

## 2024-04-30 ENCOUNTER — Encounter: Payer: Self-pay | Admitting: Pharmacist

## 2024-04-30 ENCOUNTER — Other Ambulatory Visit: Payer: Self-pay | Admitting: Pharmacist

## 2024-04-30 DIAGNOSIS — E7800 Pure hypercholesterolemia, unspecified: Secondary | ICD-10-CM

## 2024-04-30 NOTE — Progress Notes (Signed)
" ° ° ° °  04/30/2024 Name: Chelcea Zahn MRN: [id number] DOB: 07-Oct-1955   Received message from PCP advising that following further discussion with patient at Office Visit on 1/21 regarding management of hypercholesterolemia, patient expressed interest in further investigating coverage of PCSK9 inhibitor therapy for further ASCVD risk reduction.   Submit prior authorization request for Repatha [employer] on behalf of patient via CoverMyMeds today - PA for Repatha [employer] is Approved through 04/02/2025  Follow up with patient today to provide update  Discuss rational for LDL control for ASCVD risk reduction. Patient prefers to hold off on addition of Repatha today, rather requests to wait until after upcoming appointments with Cardiology tomorrow and on 2/18. Agree to follow up again by telephone in 3 weeks.  In meantime, counsel patient on anticipated cost of Repatha through her [facility] Medicare coverage as well as on option to apply for [employer] Hypercholesterolemia grant program (which is currently open) - Note based on reported income, patient would meet criteria for [employer] grant  As requested, will send patient a MyChart message with further information regarding Repatha for her to review prior to our next telephone call  Follow up plan: Clinical Pharmacist will follow up with patient by telephone on 05/23/2024 at 9:00 AM   Sharyle Sia, PharmD, JAQUELINE, CPP Clinical Pharmacist [facility] [phone]  "

## 2024-04-30 NOTE — Patient Instructions (Signed)
 Repatha is used to lower LDL (bad cholesterol)   The device is a single-dose disposable pen.    The medicine is injected under your skin and can be given by yourself or someone else (caregiver)   The most common side effects of Repatha include side effects of runny nose, sore throat, symptoms of the common cold, flu or flu-like symptoms, back pain and redness, pain, or bruising at the injection site   Tell your healthcare provider if you have any side effect that bothers you or that does not go away.    Stop taking Repatha and call your healthcare provider or seek emergency help right away if you have any of these symptoms: trouble breathing or swallowing, raised bumps (hives), rash or itching, swelling of the face, lips, tongue, throat or arms   In the meantime please review the following How to Use video and Instruction Handout at the following links below and please let me know what questions you have about the medication:   [email or web address]   [email or web address]   Thank you!    Sharyle Sia, PharmD, JAQUELINE, CPP Clinical Pharmacist [facility] [phone]

## 2024-05-23 ENCOUNTER — Other Ambulatory Visit

## 2024-06-25 ENCOUNTER — Other Ambulatory Visit

## 2024-07-14 ENCOUNTER — Other Ambulatory Visit

## 2024-12-29 ENCOUNTER — Ambulatory Visit: Admitting: Neurology
